# Patient Record
Sex: Female | Born: 1935
Health system: Southern US, Community
[De-identification: ages and names within clinical notes are randomized; demographics above are authoritative.]

## PROBLEM LIST (undated history)

## (undated) DIAGNOSIS — R001 Bradycardia, unspecified: Secondary | ICD-10-CM

## (undated) DIAGNOSIS — M109 Gout, unspecified: Secondary | ICD-10-CM

## (undated) DIAGNOSIS — N289 Disorder of kidney and ureter, unspecified: Secondary | ICD-10-CM

## (undated) DIAGNOSIS — I872 Venous insufficiency (chronic) (peripheral): Secondary | ICD-10-CM

## (undated) DIAGNOSIS — I10 Essential (primary) hypertension: Secondary | ICD-10-CM

## (undated) DIAGNOSIS — I071 Rheumatic tricuspid insufficiency: Secondary | ICD-10-CM

## (undated) DIAGNOSIS — N183 Chronic kidney disease, stage 3 unspecified: Secondary | ICD-10-CM

## (undated) DIAGNOSIS — J449 Chronic obstructive pulmonary disease, unspecified: Secondary | ICD-10-CM

## (undated) DIAGNOSIS — I493 Ventricular premature depolarization: Secondary | ICD-10-CM

## (undated) DIAGNOSIS — R079 Chest pain, unspecified: Secondary | ICD-10-CM

## (undated) HISTORY — DX: Chronic obstructive pulmonary disease, unspecified: J44.9

## (undated) HISTORY — DX: Bradycardia, unspecified: R00.1

## (undated) HISTORY — DX: Rheumatic tricuspid insufficiency: I07.1

## (undated) HISTORY — DX: Chronic kidney disease, stage 3 unspecified: N18.30

## (undated) HISTORY — PX: ABDOMINAL HYSTERECTOMY: SHX81

## (undated) HISTORY — PX: OTHER SURGICAL HISTORY: SHX169

## (undated) HISTORY — DX: Chronic kidney disease, stage 3 (moderate): N18.3

## (undated) HISTORY — DX: Venous insufficiency (chronic) (peripheral): I87.2

---

## 2007-06-02 ENCOUNTER — Ambulatory Visit: Payer: Self-pay | Admitting: Cardiology

## 2007-06-03 ENCOUNTER — Ambulatory Visit: Payer: Self-pay | Admitting: Cardiology

## 2007-06-03 ENCOUNTER — Inpatient Hospital Stay (HOSPITAL_COMMUNITY): Admission: AD | Admit: 2007-06-03 | Discharge: 2007-06-04 | Payer: Self-pay | Admitting: Cardiology

## 2011-03-14 NOTE — Cardiovascular Report (Signed)
NAMEMAHSA, HANSER NO.:  [id number]   MEDICAL RECORD NO.:  [id number]          PATIENT TYPE:  INP   LOCATION:  3738                         FACILITY:  [facility]   PHYSICIAN:  Veverly Fells. Excell Seltzer, MD  DATE OF BIRTH:  [birth year]   DATE OF PROCEDURE:  06/04/2007  DATE OF DISCHARGE:                            CARDIAC CATHETERIZATION   PROCEDURE:  Left heart catheterization, selective coronary angiography,  left ventricular angiography and Star Close of the right femoral artery.   INDICATIONS:  Ms. Fetsch is a 77 year old woman who underwent  arthroscopic knee surgery earlier this week.  Following surgery, she had  two episodes of severe substernal chest pain.  Her pain was typical for  cardiac etiology, and considering her age and the presence of cardiac  risk factors, she was referred for cardiac catheterization.  Her  objective findings have been negative.   Risks and indications of the procedure were explained to the patient.  Informed consent was obtained.  The right groin was prepped, draped and  anesthetized with 1% lidocaine.  Using the modified Seldinger technique,  a 6-French sheath was placed in the right femoral artery, and multiple  views of the left and right coronary arteries were taken using standard  6-French Judkins' catheters.  Following selective coronary angiography,  an angled pigtail catheter was inserted into the left ventricle, and  pressures were recorded.  A left ventriculogram was performed.  A  pullback across the aortic valve was done.   At the conclusion of the procedure, a Star Close device was deployed to  seal the femoral arteriotomy.  The patient tolerated the procedure well  and had no immediate complications.   FINDINGS:  Aortic pressure 109/55 with a mean of 75, left ventricular  pressure 113/18.   CORONARY ANGIOGRAPHY:  The left mainstem is angiographically normal.  It  bifurcates into the LAD and left circumflex.   The LAD  is a large-caliber vessel that courses down to the LV apex.  The  LAD has a small first diagonal branch and a large second diagonal  branch.  In the intervening segment between the two diagonals, there is  an area of focal non-obstructive plaque that creates no significant  stenosis.  The remaining portions of the mid and distal LAD have no  significant angiographic disease.  There is no angiographic disease in  the diagonal branches.   The left circumflex is a large-caliber vessel that courses down and  provides a large first OM branch.  The AV groove circumflex beyond the  OM branch is very small.  There is no significant angiographic disease  in the left circumflex.   The right coronary artery is a large vessel.  It is dominant.  There is  no significant angiographic disease throughout.  Distally, it terminates  in a PDA and posterior AV segment.  The posterior AV segment supplies  two posterolateral branches.  There is no significant angiographic  disease in the branch vessels.   Left ventricular function assessed with 30-degree RAO left  ventriculography demonstrates normal LV systolic function with an LVEF  of 65%.  There are no regional wall motion abnormalities.  There is  catheter-induced mitral regurgitation, but there does not appear to be  any physiologic mitral regurgitation.   ASSESSMENT:  1. Minor nonobstructive plaque disease in the LAD.  2. Normal left circumflex.  3. Normal right coronary artery.  4. Normal left ventricular systolic function.   PLAN:  Ms. Scobee likely has non-cardiac chest pain.  We will plan on  discharge home later today, as she is stable and pain free.      Veverly Fells. Excell Seltzer, MD  Electronically Signed     MDC/MEDQ  D:  06/04/2007  T:  06/04/2007  Job:  [id number]   cc:   Learta Codding, MD,FACC  Wyvonnia Lora

## 2011-03-14 NOTE — Discharge Summary (Signed)
Rhonda Spears, Rhonda Spears NO.:  [id number]   MEDICAL RECORD NO.:  [id number]          PATIENT TYPE:  INP   LOCATION:  3738                         FACILITY:  [facility]   PHYSICIAN:  Veverly Fells. Excell Seltzer, MD  DATE OF BIRTH:  [birth year]   DATE OF ADMISSION:  06/03/2007  DATE OF DISCHARGE:  06/04/2007                               DISCHARGE SUMMARY   PRIMARY CARDIOLOGIST:  Learta Codding, MD,FACC   PRIMARY CARE Teriana Danker:  Wyvonnia Lora, M.D.   DISCHARGE DIAGNOSIS:  Chest pain.   SECONDARY DIAGNOSES:  1. Hypertension.  2. History of palpitations.  3. Osteoarthritis of the knees, status post right knee arthroscopic      surgery.  4. Status post hysterectomy.   ALLERGIES:  SULFA.   PROCEDURE:  Left heart cardiac catheterization.   HISTORY OF PRESENT ILLNESS:  A 77 year old female without prior history  of CAD.  Risk factors notable for hypertension, family history and age.  She recently underwent left knee arthroscopic surgery by Dr. Helayne Seminole on May 31, 2007 under spinal anesthesia and did well without  complications.  On June 01, 2007, however, she had an episode of brief  chest discomfort which resolved spontaneously, and then on June 02, 2007, she had a severe episode of chest discomfort and pressure  associated with dyspnea and diaphoresis.  She presented to the [facility]  ED and was admitted for further evaluation.  She ruled out for MI at  [facility] and also underwent a CT of the chest which was  negative for pulmonary embolism and aortic dissection.  She was  transferred to [facility] after being evaluated by Dr. Andee Lineman for  further evaluation and cardiac catheterization.   HOSPITAL COURSE:  [name] underwent left heart cardiac  catheterization this morning, June 04, 2007, revealing normal coronary  arteries with an EF of 65%.  She tolerated this procedure well and  postprocedure has been ambulating without recurrent discomfort or  limitations.  She will be discharged home today in satisfactory  condition.   DISCHARGE LABORATORIES:  Hemoglobin 11.5, hematocrit 33.4, WBC 12.9,  platelets 262, MCV 93.5.  Sodium 134, potassium 3.7, chloride 103, CO2  of 24, BUN 12, creatinine 1.02, glucose 116, CK 58, MB 0.7, calcium 8.1.   DISPOSITION:  The patient is being discharged home today in good  condition.   FOLLOW-UP PLANS AND APPOINTMENTS:  She is asked to follow up with Dr.  Margo Common in 1-2 weeks.   DISCHARGE MEDICATIONS:  1. Aspirin 81 mg daily.  2. Atenolol 50 mg daily.  3. Triamterene HCTZ 37.5/25 mg daily.  4. Metanx on daily.  5. Multivitamin one daily.  6. Calcium plus D 1200 mg daily.  7. Folic acid 400 mg daily.  8. Celebrex as previously prescribed.  9. Prilosec OTC 20 mg daily.   OUTSTANDING LABORATORY STUDIES:  None.   DURATION OF DISCHARGE ENCOUNTER:  Thirty-three minutes including  physician time.      Nicolasa Ducking, ANP      Veverly Fells. Excell Seltzer, MD  Electronically Signed    CB/MEDQ  D:  06/04/2007  T:  06/04/2007  Job:  [id number]   cc:   Wyvonnia Lora

## 2011-03-14 NOTE — Discharge Summary (Signed)
NAMESHARINE, CADLE NO.:  [id number]   MEDICAL RECORD NO.:  [id number]          PATIENT TYPE:  INP   LOCATION:  3738                         FACILITY:  [facility]   PHYSICIAN:  Veverly Fells. Excell Seltzer, MD  DATE OF BIRTH:  Mar 19, 1934   DATE OF ADMISSION:  06/03/2007  DATE OF DISCHARGE:  06/04/2007                               DISCHARGE SUMMARY   ADDENDUM   SECONDARY DIAGNOSES:  Gout.  Secondary to acute gout flares, she has  been treated with colchicine as an inpatient and we will give her a  short course of Indocin therapy.  She was advised not to take Celebrex  and Indocin together.   DISCHARGE MEDICATIONS:  1. Colchicine 0.6 mg b.i.d.  2. Indocin 50 mg t.i.d. with food x7 days.      Nicolasa Ducking, ANP      Veverly Fells. Excell Seltzer, MD  Electronically Signed    CB/MEDQ  D:  06/04/2007  T:  06/04/2007  Job:  [id number]

## 2011-06-14 DIAGNOSIS — I059 Rheumatic mitral valve disease, unspecified: Secondary | ICD-10-CM

## 2011-08-14 LAB — CBC
HCT: 33.4 — ABNORMAL LOW
MCHC: 34.4
MCV: 93.5
Platelets: 262
RDW: 13.5

## 2011-08-14 LAB — BASIC METABOLIC PANEL
BUN: 12
Chloride: 103
Creatinine, Ser: 1.02
GFR calc Af Amer: >60
Glucose, Bld: 116 — ABNORMAL HIGH
Potassium: 3.7
Sodium: 134 — ABNORMAL LOW

## 2015-07-08 ENCOUNTER — Other Ambulatory Visit (HOSPITAL_COMMUNITY): Payer: Self-pay | Admitting: Pulmonary Disease

## 2015-07-08 ENCOUNTER — Ambulatory Visit (HOSPITAL_COMMUNITY)
Admission: RE | Admit: 2015-07-08 | Discharge: 2015-07-08 | Disposition: A | Discharge status: Home / Self Care | Payer: Medicare Other | Source: Ambulatory Visit | Attending: Pulmonary Disease | Admitting: Pulmonary Disease

## 2015-07-08 DIAGNOSIS — R0602 Shortness of breath: Secondary | ICD-10-CM | POA: Diagnosis not present

## 2015-07-08 DIAGNOSIS — R059 Cough, unspecified: Secondary | ICD-10-CM

## 2015-07-08 DIAGNOSIS — R05 Cough: Secondary | ICD-10-CM | POA: Diagnosis present

## 2017-03-30 ENCOUNTER — Encounter (HOSPITAL_COMMUNITY): Payer: Self-pay

## 2017-03-30 ENCOUNTER — Emergency Department (HOSPITAL_COMMUNITY): Payer: Medicare Other

## 2017-03-30 ENCOUNTER — Other Ambulatory Visit: Payer: Self-pay

## 2017-03-30 ENCOUNTER — Observation Stay (HOSPITAL_COMMUNITY)
Admission: EM | Admit: 2017-03-30 | Discharge: 2017-04-01 | Disposition: A | Discharge status: Home / Self Care | Payer: Medicare Other | Attending: Internal Medicine | Admitting: Internal Medicine

## 2017-03-30 DIAGNOSIS — N183 Chronic kidney disease, stage 3 (moderate): Secondary | ICD-10-CM | POA: Insufficient documentation

## 2017-03-30 DIAGNOSIS — K5790 Diverticulosis of intestine, part unspecified, without perforation or abscess without bleeding: Secondary | ICD-10-CM | POA: Insufficient documentation

## 2017-03-30 DIAGNOSIS — I499 Cardiac arrhythmia, unspecified: Secondary | ICD-10-CM

## 2017-03-30 DIAGNOSIS — Z882 Allergy status to sulfonamides status: Secondary | ICD-10-CM | POA: Insufficient documentation

## 2017-03-30 DIAGNOSIS — K219 Gastro-esophageal reflux disease without esophagitis: Secondary | ICD-10-CM | POA: Insufficient documentation

## 2017-03-30 DIAGNOSIS — Z91040 Latex allergy status: Secondary | ICD-10-CM | POA: Insufficient documentation

## 2017-03-30 DIAGNOSIS — Z8249 Family history of ischemic heart disease and other diseases of the circulatory system: Secondary | ICD-10-CM | POA: Insufficient documentation

## 2017-03-30 DIAGNOSIS — I10 Essential (primary) hypertension: Secondary | ICD-10-CM | POA: Insufficient documentation

## 2017-03-30 DIAGNOSIS — R0789 Other chest pain: Secondary | ICD-10-CM | POA: Diagnosis not present

## 2017-03-30 DIAGNOSIS — M109 Gout, unspecified: Secondary | ICD-10-CM | POA: Diagnosis present

## 2017-03-30 DIAGNOSIS — R51 Headache: Secondary | ICD-10-CM

## 2017-03-30 DIAGNOSIS — I498 Other specified cardiac arrhythmias: Secondary | ICD-10-CM | POA: Diagnosis present

## 2017-03-30 DIAGNOSIS — M1990 Unspecified osteoarthritis, unspecified site: Secondary | ICD-10-CM | POA: Insufficient documentation

## 2017-03-30 DIAGNOSIS — Z881 Allergy status to other antibiotic agents status: Secondary | ICD-10-CM | POA: Diagnosis not present

## 2017-03-30 DIAGNOSIS — I493 Ventricular premature depolarization: Secondary | ICD-10-CM

## 2017-03-30 DIAGNOSIS — I129 Hypertensive chronic kidney disease with stage 1 through stage 4 chronic kidney disease, or unspecified chronic kidney disease: Secondary | ICD-10-CM | POA: Diagnosis not present

## 2017-03-30 DIAGNOSIS — J449 Chronic obstructive pulmonary disease, unspecified: Secondary | ICD-10-CM | POA: Diagnosis not present

## 2017-03-30 DIAGNOSIS — Z7902 Long term (current) use of antithrombotics/antiplatelets: Secondary | ICD-10-CM | POA: Diagnosis not present

## 2017-03-30 DIAGNOSIS — R519 Headache, unspecified: Secondary | ICD-10-CM | POA: Insufficient documentation

## 2017-03-30 DIAGNOSIS — R079 Chest pain, unspecified: Secondary | ICD-10-CM

## 2017-03-30 DIAGNOSIS — N289 Disorder of kidney and ureter, unspecified: Secondary | ICD-10-CM | POA: Diagnosis not present

## 2017-03-30 DIAGNOSIS — I872 Venous insufficiency (chronic) (peripheral): Secondary | ICD-10-CM | POA: Insufficient documentation

## 2017-03-30 DIAGNOSIS — Z79899 Other long term (current) drug therapy: Secondary | ICD-10-CM | POA: Insufficient documentation

## 2017-03-30 DIAGNOSIS — I950 Idiopathic hypotension: Secondary | ICD-10-CM | POA: Insufficient documentation

## 2017-03-30 DIAGNOSIS — R0609 Other forms of dyspnea: Secondary | ICD-10-CM

## 2017-03-30 HISTORY — DX: Essential (primary) hypertension: I10

## 2017-03-30 HISTORY — DX: Gout, unspecified: M10.9

## 2017-03-30 HISTORY — DX: Ventricular premature depolarization: I49.3

## 2017-03-30 HISTORY — DX: Chest pain, unspecified: R07.9

## 2017-03-30 HISTORY — DX: Disorder of kidney and ureter, unspecified: N28.9

## 2017-03-30 LAB — BASIC METABOLIC PANEL
Anion gap: 10 (ref 5–15)
BUN: 20 mg/dL (ref 6–20)
CALCIUM: 9.4 mg/dL (ref 8.9–10.3)
CO2: 24 mmol/L (ref 22–32)
CREATININE: 1.26 mg/dL — AB (ref 0.44–1.00)
Chloride: 103 mmol/L (ref 101–111)
GFR calc Af Amer: 45 mL/min — ABNORMAL LOW (ref >60)
GFR, EST NON AFRICAN AMERICAN: 39 mL/min — AB (ref >60)
GLUCOSE: 121 mg/dL — AB (ref 65–99)
Potassium: 3.7 mmol/L (ref 3.5–5.1)
Sodium: 137 mmol/L (ref 135–145)

## 2017-03-30 LAB — TROPONIN I: Troponin I: <0.03 ng/mL (ref <0.03)

## 2017-03-30 LAB — CBC
HCT: 43.6 % (ref 36.0–46.0)
Hemoglobin: 14.9 g/dL (ref 12.0–15.0)
MCH: 33.4 pg (ref 26.0–34.0)
MCHC: 34.2 g/dL (ref 30.0–36.0)
MCV: 97.8 fL (ref 78.0–100.0)
Platelets: 208 10*3/uL (ref 150–400)
RBC: 4.46 MIL/uL (ref 3.87–5.11)
RDW: 14.2 % (ref 11.5–15.5)
WBC: 7.3 10*3/uL (ref 4.0–10.5)

## 2017-03-30 LAB — MAGNESIUM: MAGNESIUM: 2 mg/dL (ref 1.7–2.4)

## 2017-03-30 MED ORDER — NITROGLYCERIN 2 % TD OINT
1.0000 [in_us] | TOPICAL_OINTMENT | Freq: Once | TRANSDERMAL | Status: AC
  Administered 2017-03-30: 1 [in_us] via TOPICAL
  Filled 2017-03-30: qty 1

## 2017-03-30 MED ORDER — ASPIRIN EC 325 MG PO TBEC
325.0000 mg | DELAYED_RELEASE_TABLET | Freq: Once | ORAL | Status: AC
  Administered 2017-03-30: 325 mg via ORAL
  Filled 2017-03-30: qty 1

## 2017-03-30 MED ORDER — ACETAMINOPHEN 325 MG PO TABS
650.0000 mg | ORAL_TABLET | Freq: Four times a day (QID) | ORAL | Status: DC | PRN
  Administered 2017-03-30: 650 mg via ORAL
  Filled 2017-03-30: qty 2

## 2017-03-30 MED ORDER — NITROGLYCERIN 0.4 MG SL SUBL
0.4000 mg | SUBLINGUAL_TABLET | SUBLINGUAL | Status: DC | PRN
  Administered 2017-03-30: 0.4 mg via SUBLINGUAL
  Filled 2017-03-30: qty 1

## 2017-03-30 NOTE — ED Notes (Signed)
Rates chest pain at a 1 now.

## 2017-03-30 NOTE — ED Notes (Signed)
Report given to Jacki ConesLaurie, [facility]

## 2017-03-30 NOTE — ED Provider Notes (Signed)
[facility]-EMERGENCY DEPT Provider Note   CSN: [id number] Arrival date & time: 03/30/17  1436     History   Chief Complaint Chief Complaint  Patient presents with  . Chest Pain    HPI Rhonda Spears is a 83 y.o. female.  Pt presents to the ED today with chest pressure and sob.  The pt has had sob for a while.  She has seen Dr. Juanetta Gosling for this problem, but has had no treatment for it.  She now gets sob to walk across the room.   The pt said the chest pressure started yesterday.  She saw her pcp who made her an appt for the cardiologist on Monday, June 4.  Pt had more cp today, and was concerned.        Past Medical History:  Diagnosis Date  . Gout   . Hypertension   . Renal disorder     Patient Active Problem List   Diagnosis Date Noted  . HTN (hypertension) 03/30/2017  . Atypical chest pain 03/30/2017  . Chronic headache disorder 03/30/2017  . Osteoarthritis 03/30/2017  . Venous insufficiency 03/30/2017  . Diverticulosis 03/30/2017  . Bigeminy 03/30/2017  . DOE (dyspnea on exertion) 03/30/2017  . Idiopathic hypotension 03/30/2017    Past Surgical History:  Procedure Laterality Date  . arthroscopic left knee    . repair left femoral neck    . right medial men      OB History    No data available       Home Medications    Prior to Admission medications   Medication Sig Start Date End Date Taking? Authorizing Provider  allopurinol (ZYLOPRIM) 100 MG tablet Take 100 mg by mouth at bedtime. 2 tabs hs    [provider]  amitriptyline (ELAVIL) 10 MG tablet Take 10 mg by mouth at bedtime. 1/2 tab hs restless leg    [provider]  atenolol (TENORMIN) 25 MG tablet Take 25 mg by mouth 2 (two) times daily.    [provider]  atenolol (TENORMIN) 50 MG tablet  03/23/17   [provider]  benzonatate (TESSALON) 200 MG capsule Take 200 mg by mouth 3 (three) times daily as needed for cough.    [provider]  Calcium  Carb-Cholecalciferol (CALCIUM-VITAMIN D) 500-200 MG-UNIT tablet Take 1 tablet by mouth 2 (two) times daily.    [provider]  clopidogrel (PLAVIX) 75 MG tablet Take 75 mg by mouth daily.    [provider]  docusate sodium (COLACE) 100 MG capsule Take 100 mg by mouth 2 (two) times daily.    [provider]  folic acid (FOLVITE) 1 MG tablet Take 1 mg by mouth daily.    [provider]  HYDROcodone-acetaminophen (NORCO/VICODIN) 5-325 MG tablet Take 1 tablet by mouth every 6 (six) hours as needed for moderate pain.    [provider]  magnesium oxide (MAG-OX) 400 MG tablet Take 400 mg by mouth daily.    [provider]  Multiple Vitamin (MULTIVITAMIN) capsule Take 1 capsule by mouth daily.    [provider]  ranitidine (ZANTAC) 300 MG tablet Take 300 mg by mouth at bedtime.    [provider]  spironolactone (ALDACTONE) 25 MG tablet Take 25 mg by mouth 2 (two) times daily.    [provider]    Family History Family History  Problem Relation Age of Onset  . Heart attack Mother   . Hypertension Mother     Social  History Social History  Substance Use Topics  . Smoking status: Never Smoker  . Smokeless tobacco: Never Used  . Alcohol use No     Allergies   Tramadol; Sulfa antibiotics; and Zanaflex [tizanidine hcl]   Review of Systems Review of Systems  Respiratory: Positive for shortness of breath.   Cardiovascular: Positive for chest pain.  All other systems reviewed and are negative.    Physical Exam Updated Vital Signs BP 101/62   Pulse (!) 58   Temp 98 F (36.7 C) (Oral)   Resp 16   Ht 5\' 4"  (1.626 m)   Wt 62.6 kg (138 lb)   SpO2 94%   BMI 23.69 kg/m   Physical Exam  Constitutional: She is oriented to person, place, and time. She appears well-developed and well-nourished.  HENT:  Head: Normocephalic and atraumatic.  Right Ear: External ear normal.  Left Ear: External ear normal.    Nose: Nose normal.  Mouth/Throat: Oropharynx is clear and moist.  Eyes: Conjunctivae and EOM are normal. Pupils are equal, round, and reactive to light.  Neck: Normal range of motion. Neck supple.  Cardiovascular: Normal rate, regular rhythm, normal heart sounds and intact distal pulses.   Pulmonary/Chest: Effort normal and breath sounds normal.  Abdominal: Soft. Bowel sounds are normal.  Musculoskeletal: Normal range of motion.  Neurological: She is alert and oriented to person, place, and time.  Skin: Skin is warm.  Psychiatric: She has a normal mood and affect. Her behavior is normal. Judgment and thought content normal.  Nursing note and vitals reviewed.    ED Treatments / Results  Labs (all labs ordered are listed, but only abnormal results are displayed) Labs Reviewed  BASIC METABOLIC PANEL - Abnormal; Notable for the following:       Result Value   Glucose, Bld 121 (*)    Creatinine, Ser 1.26 (*)    GFR calc non Af Amer 39 (*)    GFR calc Af Amer 45 (*)    All other components within normal limits  CBC  TROPONIN I    EKG  EKG Interpretation  Date/Time:  Friday March 30 2017 14:43:49 EDT Ventricular Rate:  59 PR Interval:  202 QRS Duration: 84 QT Interval:  406 QTC Calculation: 401 R Axis:   -43 Text Interpretation:  Sinus bradycardia with frequent Premature ventricular complexes in a pattern of bigeminy Left axis deviation Low voltage QRS Septal infarct , age undetermined Abnormal ECG bigeminy is new Confirmed by Jacalyn LefevreHaviland, Jashae Wiggs [phone]([id number]) on 03/30/2017 3:26:07 PM       Radiology Dg Chest 2 View  Result Date: 03/30/2017 CLINICAL DATA:  Chest pain EXAM: CHEST  2 VIEW COMPARISON:  04/13/2016 FINDINGS: Chronic hyperinflation and interstitial coarsening. Chronic biapical and substernal pleuroparenchymal scarring. 11 mm nodular density over the left base likely nipple shadow, also suggested on the right. Nipple position cannot be confirmed due to coverage in the lateral  projection. Normal heart size. Aortic tortuosity. IMPRESSION: 1. COPD and scarring without acute superimposed finding. 2. 11 mm nodular density at the left base favors nipple shadow. Recommend nonemergent follow-up chest x-ray which could use nipple markers. Electronically Signed   By: Marnee SpringJonathon  Watts M.D.   On: 03/30/2017 15:07    Procedures Procedures (including critical care time)  Medications Ordered in ED Medications  nitroGLYCERIN (NITROSTAT) SL tablet 0.4 mg (0.4 mg Sublingual Given 03/30/17 1711)  nitroGLYCERIN (NITROGLYN) 2 % ointment 1 inch (not administered)  aspirin EC tablet 325 mg (325 mg Oral Given  03/30/17 1711)     Initial Impression / Assessment and Plan / ED Course  I have reviewed the triage vital signs and the nursing notes.  Pertinent labs & imaging results that were available during my care of the patient were reviewed by me and considered in my medical decision making (see chart for details).    Pt's CP improved with nitro.  She was given nitro paste also.  The pt was d/w Dr. Adrian Blackwater (triad) who requested that I speak with cardiology at [facility] as he felt pt would be better there in case she needed an intervention.  The pt was d/w Dr. Delton See (cardiology) who consult herself upon pt's arrival or have someone from the group see her.   Final Clinical Impressions(s) / ED Diagnoses   Final diagnoses:  Chest pain, unspecified type  Bigeminy    New Prescriptions New Prescriptions   No medications on file     Jacalyn Lefevre, MD 03/30/17 1757

## 2017-03-30 NOTE — H&P (Signed)
History and Physical  NOVALI VOLLMAN ZOX:[id number] DOB: 1934-08-19 DOA: 03/30/2017  Referring physician: Dr Particia Nearing, ED physician PCP: Louie Boston., MD  Outpatient Specialists:   Patient Coming From: home  Chief Complaint: chest pain  HPI: Rhonda Spears is a 83 y.o. female with a history of Gout, HTN, GERD. Patient presents for evaluation of chest pain, which started a little more than a week ago. Patient saw her primary care physician who got her appointment with cardiology on Monday 6/4. She was also prescribed Plavix for prophylaxis, although she has not started yet. The patient had worsening chest pain over the past 2-3 days, although worse today. It was accompanied by shortness of breath, worse with exertion to about 15-20 feet. Chest pain is nonradiating.  Emergency Department Course: Patient seen nitroglycerin and aspirin, with moderate improvement of her pain. EKG and plantar monitoring showed bigeminy with a rate of 58-60. EDP consulted cardiology, who will see the patient at most, hospital.  Review of Systems:   Pt denies any fevers, chills, nausea, vomiting, diarrhea, constipation, abdominal pain, shortness of breath, dyspnea on exertion, orthopnea, cough, wheezing, palpitations, headache, vision changes, lightheadedness, dizziness, melena, rectal bleeding.  Review of systems are otherwise negative  Past Medical History:  Diagnosis Date  . Gout   . Hypertension   . Renal disorder    Past Surgical History:  Procedure Laterality Date  . arthroscopic left knee    . repair left femoral neck    . right medial men     Social History:  reports that she has never smoked. She has never used smokeless tobacco. She reports that she does not drink alcohol or use drugs. Patient lives atHome  Allergies  Allergen Reactions  . Latex Itching  . Omeprazole Palpitations  . Tramadol Itching and Nausea Only    No hives  . Sulfa Antibiotics Nausea Only and Other (See Comments)      Severe headaches  . Zanaflex [Tizanidine Hcl] Itching and Other (See Comments)    oversedation    Family History  Problem Relation Age of Onset  . Heart attack Mother   . Hypertension Mother       Prior to Admission medications   Medication Sig Start Date End Date Taking? Authorizing Provider  allopurinol (ZYLOPRIM) 100 MG tablet Take 100 mg by mouth at bedtime. 2 tabs hs    [provider]  amitriptyline (ELAVIL) 10 MG tablet Take 10 mg by mouth at bedtime. 1/2 tab hs restless leg    [provider]  atenolol (TENORMIN) 25 MG tablet Take 25 mg by mouth 2 (two) times daily.    [provider]  clopidogrel (PLAVIX) 75 MG tablet Take 75 mg by mouth daily.    [provider]  folic acid (FOLVITE) 1 MG tablet Take 1 mg by mouth daily.    [provider]  HYDROcodone-acetaminophen (NORCO/VICODIN) 5-325 MG tablet Take 1 tablet by mouth every 6 (six) hours as needed for moderate pain.    [provider]  magnesium oxide (MAG-OX) 400 MG tablet Take 400 mg by mouth daily.    [provider]  Multiple Vitamin (MULTIVITAMIN) capsule Take 1 capsule by mouth daily.    [provider]  ranitidine (ZANTAC) 300 MG tablet Take 300 mg by mouth at bedtime.    [provider]  spironolactone (ALDACTONE) 25 MG tablet Take 25 mg by mouth 2 (two) times daily.    [provider]    Physical  Exam: BP 101/62   Pulse (!) 58   Temp 98 F (36.7 C) (Oral)   Resp 16   Ht 5\' 4"  (1.626 m)   Wt 62.6 kg (138 lb)   SpO2 94%   BMI 23.69 kg/m   General: Elderly Caucasian female. Awake and alert and oriented x3. No acute cardiopulmonary distress.  HEENT: Normocephalic atraumatic.  Right and left ears normal in appearance.  Pupils equal, round, reactive to light. Extraocular muscles are intact. Sclerae anicteric and noninjected.  Moist mucosal membranes. No mucosal lesions.  Neck: Neck supple without lymphadenopathy. No  carotid bruits. No masses palpated.  Cardiovascular: Regular rate with normal S1-S2 sounds. No murmurs, rubs, gallops auscultated. No JVD.  Respiratory: Good respiratory effort with no wheezes, rales, rhonchi. Lungs clear to auscultation bilaterally.  No accessory muscle use. Abdomen: Soft, nontender, nondistended. Active bowel sounds. No masses or hepatosplenomegaly  Skin: No rashes, lesions, or ulcerations.  Dry, warm to touch. 2+ dorsalis pedis and radial pulses. Musculoskeletal: No calf or leg pain. All major joints not erythematous nontender.  No upper or lower joint deformation.  Good ROM.  No contractures  Psychiatric: Intact judgment and insight. Pleasant and cooperative. Neurologic: No focal neurological deficits. Strength is 5/5 and symmetric in upper and lower extremities.  Cranial nerves II through XII are grossly intact.           Labs on Admission: I have personally reviewed following labs and imaging studies  CBC:  Recent Labs Lab 03/30/17 1514  WBC 7.3  HGB 14.9  HCT 43.6  MCV 97.8  PLT 208   Basic Metabolic Panel:  Recent Labs Lab 03/30/17 1514  NA 137  K 3.7  CL 103  CO2 24  GLUCOSE 121*  BUN 20  CREATININE 1.26*  CALCIUM 9.4   GFR: Estimated Creatinine Clearance: 30.8 mL/min (A) (by C-G formula based on SCr of 1.26 mg/dL (H)). Liver Function Tests: No results for input(s): AST, ALT, ALKPHOS, BILITOT, PROT, ALBUMIN in the last 168 hours. No results for input(s): LIPASE, AMYLASE in the last 168 hours. No results for input(s): AMMONIA in the last 168 hours. Coagulation Profile: No results for input(s): INR, PROTIME in the last 168 hours. Cardiac Enzymes:  Recent Labs Lab 03/30/17 1514  TROPONINI <0.03   BNP (last 3 results) No results for input(s): PROBNP in the last 8760 hours. HbA1C: No results for input(s): HGBA1C in the last 72 hours. CBG: No results for input(s): GLUCAP in the last 168 hours. Lipid Profile: No results for input(s):  CHOL, HDL, LDLCALC, TRIG, CHOLHDL, LDLDIRECT in the last 72 hours. Thyroid Function Tests: No results for input(s): TSH, T4TOTAL, FREET4, T3FREE, THYROIDAB in the last 72 hours. Anemia Panel: No results for input(s): VITAMINB12, FOLATE, FERRITIN, TIBC, IRON, RETICCTPCT in the last 72 hours. Urine analysis: No results found for: COLORURINE, APPEARANCEUR, LABSPEC, PHURINE, GLUCOSEU, HGBUR, BILIRUBINUR, KETONESUR, PROTEINUR, UROBILINOGEN, NITRITE, LEUKOCYTESUR Sepsis Labs: @LABRCNTIP (procalcitonin:4,lacticidven:4) )No results found for this or any previous visit (from the past 240 hour(s)).   Radiological Exams on Admission: Dg Chest 2 View  Result Date: 03/30/2017 CLINICAL DATA:  Chest pain EXAM: CHEST  2 VIEW COMPARISON:  04/13/2016 FINDINGS: Chronic hyperinflation and interstitial coarsening. Chronic biapical and substernal pleuroparenchymal scarring. 11 mm nodular density over the left base likely nipple shadow, also suggested on the right. Nipple position cannot be confirmed due to coverage in the lateral projection. Normal heart size. Aortic tortuosity. IMPRESSION: 1. COPD and scarring without acute superimposed finding. 2. 11 mm nodular  density at the left base favors nipple shadow. Recommend nonemergent follow-up chest x-ray which could use nipple markers. Electronically Signed   By: Marnee SpringJonathon  Watts M.D.   On: 03/30/2017 15:07    EKG: Independently reviewed. New bigeminy  Assessment/Plan: Active Problems:   HTN (hypertension)   Bigeminy   Chest pain    This patient was discussed with the ED physician, including pertinent vitals, physical exam findings, labs, and imaging.  We also discussed care given by the ED provider.  #1 chest pain  [facility]  Cardiology consulted  Chest pain rule out with serial troponins  Echocardiogram tomorrow  Morphine for pain  Nitro paste placed  Tylenol for symptomatic headache #2 bigeminy  Cardiology  consulted  Check magnesium and TSH #3 hypertension  Controlled  DVT prophylaxis: Lovenox Consultants: Cardiology Code Status: Full code Family Communication: Multiple family members in the room  Disposition Plan: Observation with probable discharge tomorrow pending cardiology input   Levie HeritageStinson, Zyen Triggs J, DO [facility] Pager [phone]  If 7PM-7AM, please contact night-coverage [email or web address] Password [email or web address]

## 2017-03-30 NOTE — ED Notes (Signed)
Verbalized some relief of chest pain .  Rates 3/10

## 2017-03-30 NOTE — ED Triage Notes (Signed)
Reports of chest pain started yesterday with shortness of breath. Has apt with cardiology Monday but pain was worsening.

## 2017-03-30 NOTE — ED Notes (Signed)
Pt used bedpan, cleaned up at this time

## 2017-03-30 NOTE — ED Notes (Signed)
EKG given to Dr. Yelverton 

## 2017-03-31 DIAGNOSIS — I499 Cardiac arrhythmia, unspecified: Secondary | ICD-10-CM | POA: Diagnosis not present

## 2017-03-31 DIAGNOSIS — R072 Precordial pain: Secondary | ICD-10-CM

## 2017-03-31 DIAGNOSIS — M109 Gout, unspecified: Secondary | ICD-10-CM

## 2017-03-31 DIAGNOSIS — I10 Essential (primary) hypertension: Secondary | ICD-10-CM | POA: Diagnosis not present

## 2017-03-31 DIAGNOSIS — N183 Chronic kidney disease, stage 3 (moderate): Secondary | ICD-10-CM

## 2017-03-31 DIAGNOSIS — R079 Chest pain, unspecified: Secondary | ICD-10-CM

## 2017-03-31 DIAGNOSIS — R0789 Other chest pain: Secondary | ICD-10-CM | POA: Diagnosis not present

## 2017-03-31 LAB — BASIC METABOLIC PANEL
ANION GAP: 7 (ref 5–15)
BUN: 19 mg/dL (ref 6–20)
CHLORIDE: 104 mmol/L (ref 101–111)
CO2: 24 mmol/L (ref 22–32)
Calcium: 8.9 mg/dL (ref 8.9–10.3)
Creatinine, Ser: 1.14 mg/dL — ABNORMAL HIGH (ref 0.44–1.00)
GFR calc Af Amer: 51 mL/min — ABNORMAL LOW (ref >60)
GFR, EST NON AFRICAN AMERICAN: 44 mL/min — AB (ref >60)
GLUCOSE: 95 mg/dL (ref 65–99)
POTASSIUM: 4.4 mmol/L (ref 3.5–5.1)
Sodium: 135 mmol/L (ref 135–145)

## 2017-03-31 LAB — TSH: TSH: 2.418 u[IU]/mL (ref 0.350–4.500)

## 2017-03-31 MED ORDER — ACETAMINOPHEN 325 MG PO TABS
650.0000 mg | ORAL_TABLET | ORAL | Status: DC | PRN
  Administered 2017-03-31: 650 mg via ORAL
  Filled 2017-03-31: qty 2

## 2017-03-31 MED ORDER — POTASSIUM CHLORIDE CRYS ER 20 MEQ PO TBCR
40.0000 meq | EXTENDED_RELEASE_TABLET | Freq: Once | ORAL | Status: DC
  Filled 2017-03-31: qty 2

## 2017-03-31 MED ORDER — ENOXAPARIN SODIUM 40 MG/0.4ML ~~LOC~~ SOLN
40.0000 mg | Freq: Every day | SUBCUTANEOUS | Status: DC
  Administered 2017-03-31 (×2): 40 mg via SUBCUTANEOUS
  Filled 2017-03-31 (×2): qty 0.4

## 2017-03-31 MED ORDER — MORPHINE SULFATE (PF) 2 MG/ML IV SOLN: 2.0000 mg | INTRAVENOUS | Status: DC | PRN

## 2017-03-31 MED ORDER — ONDANSETRON HCL 4 MG/2ML IJ SOLN: 4.0000 mg | Freq: Four times a day (QID) | INTRAMUSCULAR | Status: DC | PRN

## 2017-03-31 MED ORDER — GI COCKTAIL ~~LOC~~: 30.0000 mL | Freq: Four times a day (QID) | ORAL | Status: DC | PRN

## 2017-03-31 MED ORDER — ASPIRIN EC 325 MG PO TBEC
325.0000 mg | DELAYED_RELEASE_TABLET | Freq: Every day | ORAL | Status: DC
  Administered 2017-03-31 – 2017-04-01 (×2): 325 mg via ORAL
  Filled 2017-03-31 (×2): qty 1

## 2017-03-31 NOTE — Consult Note (Addendum)
Cardiology Consultation:   Patient ID: Rhonda Spears; [id number]; [birth year]   Admit date: 03/30/2017 Date of Consult: 03/31/2017  Primary Care Provider: Louie Boston., MD Primary Cardiologist: new Primary Electrophysiologist:  new   Patient Profile:   Rhonda Spears is a 83 y.o. female with a hx of HTN who is being seen today for the evaluation of chest pressure and sob at the request of Dr. Adrian Blackwater.  History of Present Illness:   Rhonda Spears is a 83 yo woman with HTN who presented with chest pain about 15 years ago. She had a left heart cath and was told it was all ok. She has c/o worsening sob and chest pressure with exertion over the past few months. The patient has not had syncope. She does not c/o peripheral edema. The patient denies arm pain. Associated with her symptoms are palpitations. She has had some PVC's.   Past Medical History:  Diagnosis Date  . Gout   . Hypertension   . Renal disorder     Past Surgical History:  Procedure Laterality Date  . arthroscopic left knee    . repair left femoral neck    . right medial men       Inpatient Medications: Scheduled Meds: . aspirin EC  325 mg Oral Daily  . enoxaparin (LOVENOX) injection  40 mg Subcutaneous QHS   Continuous Infusions:  PRN Meds: acetaminophen, gi cocktail, morphine injection, nitroGLYCERIN, ondansetron (ZOFRAN) IV  Allergies:    Allergies  Allergen Reactions  . Latex Itching  . Omeprazole Palpitations  . Tramadol Itching and Nausea Only    No hives  . Sulfa Antibiotics Nausea Only and Other (See Comments)    Severe headaches  . Zanaflex [Tizanidine Hcl] Itching and Other (See Comments)    oversedation    Social History:   Social History   Social History  . Marital status: Divorced    Spouse name: N/A  . Number of children: N/A  . Years of education: N/A   Occupational History  . Not on file.   Social History Main Topics  . Smoking status: Never Smoker  . Smokeless  tobacco: Never Used  . Alcohol use No  . Drug use: No  . Sexual activity: Not on file   Other Topics Concern  . Not on file   Social History Narrative  . No narrative on file    Family History:   The patient's family history includes Heart attack in her mother; Hypertension in her mother.  ROS:  Please see the history of present illness.  ROS  All other ROS reviewed and negative.     Physical Exam/Data:   Vitals:   03/30/17 2342 03/31/17 0509 03/31/17 0803 03/31/17 1305  BP: (!) 104/56 (!) 92/56 97/69 (!) 102/54  Pulse: (!) 55 (!) 54 (!) 51 60  Resp: (!) 22 20 18 18   Temp: 97.8 F (36.6 C) 98.4 F (36.9 C) 97.5 F (36.4 C) 98.2 F (36.8 C)  TempSrc: Oral Oral Oral Oral  SpO2: 97% 94% 95% 95%  Weight: 134 lb (60.8 kg)     Height: 5\' 4"  (1.626 m)       Intake/Output Summary (Last 24 hours) at 03/31/17 1340 Last data filed at 03/31/17 1304  Gross per 24 hour  Intake              480 ml  Output                5  ml  Net              475 ml   Filed Weights   03/30/17 1440 03/30/17 2342  Weight: 138 lb (62.6 kg) 134 lb (60.8 kg)   Body mass index is 23 kg/m.  General:  Well nourished, well developed elderly woman, in no acute distress HEENT: normal with moist oralpharynx Lymph: no adenopathy Neck: 6-7 cm JVD Endocrine:  No thryomegaly Vascular: No carotid bruits; FA pulses 2+ bilaterally without bruits  Cardiac:  normal S1, S2; RRR; no murmur  Lungs:  clear to auscultation bilaterally, no wheezing, rhonchi or rales  Abd: soft, nontender, no hepatomegaly  Ext: no edema Musculoskeletal:  No deformities, BUE and BLE strength normal and equal Skin: warm and dry  Neuro:  CNs 2-12 intact, no focal abnormalities noted Psych:  Normal affect    EKG:  The EKG was personally reviewed and demonstrates nsr  Relevant CV Studies: Echo pending  Laboratory Data:  Chemistry Recent Labs Lab 03/30/17 1514  NA 137  K 3.7  CL 103  CO2 24  GLUCOSE 121*  BUN 20    CREATININE 1.26*  CALCIUM 9.4  GFRNONAA 39*  GFRAA 45*  ANIONGAP 10    No results for input(s): PROT, ALBUMIN, AST, ALT, ALKPHOS, BILITOT in the last 168 hours. Hematology Recent Labs Lab 03/30/17 1514  WBC 7.3  RBC 4.46  HGB 14.9  HCT 43.6  MCV 97.8  MCH 33.4  MCHC 34.2  RDW 14.2  PLT 208   Cardiac Enzymes Recent Labs Lab 03/30/17 1514 03/30/17 1830  TROPONINI <0.03 <0.03   No results for input(s): TROPIPOC in the last 168 hours.  BNPNo results for input(s): BNP, PROBNP in the last 168 hours.  DDimer No results for input(s): DDIMER in the last 168 hours.  Radiology/Studies:  Dg Chest 2 View  Result Date: 03/30/2017 CLINICAL DATA:  Chest pain EXAM: CHEST  2 VIEW COMPARISON:  04/13/2016 FINDINGS: Chronic hyperinflation and interstitial coarsening. Chronic biapical and substernal pleuroparenchymal scarring. 11 mm nodular density over the left base likely nipple shadow, also suggested on the right. Nipple position cannot be confirmed due to coverage in the lateral projection. Normal heart size. Aortic tortuosity. IMPRESSION: 1. COPD and scarring without acute superimposed finding. 2. 11 mm nodular density at the left base favors nipple shadow. Recommend nonemergent follow-up chest x-ray which could use nipple markers. Electronically Signed   By: Marnee SpringJonathon  Watts M.D.   On: 03/30/2017 15:07    Assessment and Plan:   1. Chest pressure and sob with exertion - she has ruled out for MI.  The etiology is unclear. If her EF is normal, then a stress test as an outpatient and some lasix 20-40 mg daily would be appropriate. If her EF is abnormal then a right and left heart cath would be reasonable.  2. HTN - her blood pressure is well controlled. She will continue her current meds. 3. PVC's - this has improved. She will likely need an outpatient cardiac monitor.  Signed, Lewayne BuntingGregg Taylor, MD  03/31/2017 1:40 PM

## 2017-03-31 NOTE — Progress Notes (Signed)
PROGRESS NOTE    Rhonda Spears  WUJ:[id number] DOB: 06/23/1934 DOA: 03/30/2017 PCP: Louie Bostonapper, David B., MD   Chief Complaint  Patient presents with  . Chest Pain    Brief Narrative:  HPI on 03/30/2017 by Dr. Candelaria CelesteJacob Stinson Rhonda DadaYvonne L Spears is a 83 y.o. female with a history of Gout, HTN, GERD. Patient presents for evaluation of chest pain, which started a little more than a week ago. Patient saw her primary care physician who got her appointment with cardiology on Monday 6/4. She was also prescribed Plavix for prophylaxis, although she has not started yet. The patient had worsening chest pain over the past 2-3 days, although worse today. It was accompanied by shortness of breath, worse with exertion to about 15-20 feet. Chest pain is nonradiating.  Assessment & Plan   Chest pain with dyspnea on exertion -unknown etiology -CXR unremarkable for infection, does show COPD -Troponin cycled and currently negative -Cardiology consulted and appreciated -Echocardiogram pending -Continue aspirin   Bigeminy/PVC -Will likely need outpatient cardiac monitor  -TSH 2.418 -Magnesium 2, potassium 3.7 (will give supplemental dose- goal of 4)  Essential hypertension -BP currently soft to normal -continue to monitor  Chronic kidney disease, stage III -Creatinine currently 1.26, GFR in stage III as it was back in 2008  Gout -on allopurinol at home  DVT Prophylaxis  Lovenox  Code Status: Full  Family Communication: Family at bedside  Disposition Plan: Observation. Pending echocardiogram. Discharge to home pending workup.   Consultants Cardiology/EP  Procedures  None  Antibiotics   Anti-infectives    None      Subjective:   Rhonda Spears seen and examined today.  Currently feels a sharp pain in her left side, with shortness of breath. Denies current abdominal pain, nausea or vomiting. Denies dizziness, headache, or loss of consciousness.   Objective:   Vitals:   03/30/17 2342  03/31/17 0509 03/31/17 0803 03/31/17 1305  BP: (!) 104/56 (!) 92/56 97/69 (!) 102/54  Pulse: (!) 55 (!) 54 (!) 51 60  Resp: (!) 22 20 18 18   Temp: 97.8 F (36.6 C) 98.4 F (36.9 C) 97.5 F (36.4 C) 98.2 F (36.8 C)  TempSrc: Oral Oral Oral Oral  SpO2: 97% 94% 95% 95%  Weight: 60.8 kg (134 lb)     Height: 5\' 4"  (1.626 m)       Intake/Output Summary (Last 24 hours) at 03/31/17 1416 Last data filed at 03/31/17 1304  Gross per 24 hour  Intake              480 ml  Output                5 ml  Net              475 ml   Filed Weights   03/30/17 1440 03/30/17 2342  Weight: 62.6 kg (138 lb) 60.8 kg (134 lb)    Exam  General: Well developed, well nourished, NAD, appears stated age  HEENT: NCAT, mucous membranes moist.   Cardiovascular: S1 S2 auscultated, no murmurs, bradycardic  Respiratory: Clear to auscultation bilaterally with equal chest rise  Abdomen: Soft, nontender, nondistended, + bowel sounds  Extremities: warm dry without cyanosis clubbing or edema  Neuro: AAOx3, nonfocal  Psych: Normal affect and demeanor with intact judgement and insight  Data Reviewed: I have personally reviewed following labs and imaging studies  CBC:  Recent Labs Lab 03/30/17 1514  WBC 7.3  HGB 14.9  HCT 43.6  MCV  97.8  PLT 208   Basic Metabolic Panel:  Recent Labs Lab 03/30/17 1514 03/30/17 1830  NA 137  --   K 3.7  --   CL 103  --   CO2 24  --   GLUCOSE 121*  --   BUN 20  --   CREATININE 1.26*  --   CALCIUM 9.4  --   MG  --  2.0   GFR: Estimated Creatinine Clearance: 30.8 mL/min (A) (by C-G formula based on SCr of 1.26 mg/dL (H)). Liver Function Tests: No results for input(s): AST, ALT, ALKPHOS, BILITOT, PROT, ALBUMIN in the last 168 hours. No results for input(s): LIPASE, AMYLASE in the last 168 hours. No results for input(s): AMMONIA in the last 168 hours. Coagulation Profile: No results for input(s): INR, PROTIME in the last 168 hours. Cardiac  Enzymes:  Recent Labs Lab 03/30/17 1514 03/30/17 1830  TROPONINI <0.03 <0.03   BNP (last 3 results) No results for input(s): PROBNP in the last 8760 hours. HbA1C: No results for input(s): HGBA1C in the last 72 hours. CBG: No results for input(s): GLUCAP in the last 168 hours. Lipid Profile: No results for input(s): CHOL, HDL, LDLCALC, TRIG, CHOLHDL, LDLDIRECT in the last 72 hours. Thyroid Function Tests:  Recent Labs  03/31/17 0249  TSH 2.418   Anemia Panel: No results for input(s): VITAMINB12, FOLATE, FERRITIN, TIBC, IRON, RETICCTPCT in the last 72 hours. Urine analysis: No results found for: COLORURINE, APPEARANCEUR, LABSPEC, PHURINE, GLUCOSEU, HGBUR, BILIRUBINUR, KETONESUR, PROTEINUR, UROBILINOGEN, NITRITE, LEUKOCYTESUR Sepsis Labs: @LABRCNTIP (procalcitonin:4,lacticidven:4)  )No results found for this or any previous visit (from the past 240 hour(s)).    Radiology Studies: Dg Chest 2 View  Result Date: 03/30/2017 CLINICAL DATA:  Chest pain EXAM: CHEST  2 VIEW COMPARISON:  04/13/2016 FINDINGS: Chronic hyperinflation and interstitial coarsening. Chronic biapical and substernal pleuroparenchymal scarring. 11 mm nodular density over the left base likely nipple shadow, also suggested on the right. Nipple position cannot be confirmed due to coverage in the lateral projection. Normal heart size. Aortic tortuosity. IMPRESSION: 1. COPD and scarring without acute superimposed finding. 2. 11 mm nodular density at the left base favors nipple shadow. Recommend nonemergent follow-up chest x-ray which could use nipple markers. Electronically Signed   By: Marnee Spring M.D.   On: 03/30/2017 15:07     Scheduled Meds: . aspirin EC  325 mg Oral Daily  . enoxaparin (LOVENOX) injection  40 mg Subcutaneous QHS   Continuous Infusions:   LOS: 0 days   Time Spent in minutes   30 minutes  Wess Baney D.O. on 03/31/2017 at 2:16 PM  Between 7am to 7pm - Pager - [phone]  After  7pm go to [email or web address] - password [email or web address]  And look for the night coverage person covering for me after hours  [facility] Office  [phone]

## 2017-03-31 NOTE — Discharge Summary (Signed)
Physician Discharge Summary  Rhonda Spears ZOX:[id number] DOB: 12/03/1933 DOA: 03/30/2017  PCP: Louie Boston., MD  Admit date: 03/30/2017 Discharge date: 04/01/2017  Time spent: 45 minutes  Recommendations for Outpatient Follow-up:  Patient will be discharged to home.  Patient will need to follow up with primary care provider within one week of discharge.  Follow up with cardiology for outpatient stress test and cardiac montior. Patient should continue medications as prescribed.  Patient should follow a heart healthy diet.   Discharge Diagnoses:  Chest pain with dyspnea on exertion Bigeminy/PVC Essential hypertension Chronic kidney disease, stage III Gout  Discharge Condition: Stable  Diet recommendation: heart healthy  Filed Weights   03/30/17 1440 03/30/17 2342  Weight: 62.6 kg (138 lb) 60.8 kg (134 lb)    History of present illness:  on 03/30/2017 by Dr. Charlyn Minerva a 83 y.o.femalewith a history of Gout, HTN, GERD. Patient presents for evaluation of chest pain, which started a little more than a week ago. Patient saw her primary care physician who got her appointment with cardiology on Monday 6/4. She was also prescribed Plavix for prophylaxis, although she has not started yet. The patient had worsening chest pain over the past 2-3 days, although worse today. It was accompanied by shortness of breath, worse with exertion to about 15-20 feet. Chest pain is nonradiating.  Hospital Course:  Chest pain with dyspnea on exertion -unknown etiology -CXR unremarkable for infection, does show COPD -Troponin cycled and currently negative -Cardiology consulted and appreciated -Echocardiogram showed EF 55-60%, grade 2 diastolic dysfunction -Continue aspirin (patient prescribed plavix however had not started it yet)  Bigeminy/PVC, bradycardia -Will likely need outpatient cardiac monitor  -TSH 2.418 -Magnesium 2 -Potassium 4.4 -noted to have bradycardia, with  HR as low as 30. Currently in the 50s. Suspect atenolol may not be clearing well given her CKD, stage III. Discussed with Dr. Ladona Ridgel, will reduce dose of atenolol to 25mg  daily.   Essential hypertension -BP currently soft to normal -Continue atenolol, reduced dose -continue to monitor  Chronic kidney disease, stage III -Creatinine currently 1.14 -GFR in stage III as it was back in 2008  Gout -on allopurinol at home  Procedures: Echocardiogram  Consultations: Cardiology/EP  Discharge Exam: Vitals:   04/01/17 0445 04/01/17 1505  BP: 110/61 111/64  Pulse: (!) 59 67  Resp: 18 18  Temp: 98 F (36.7 C) 97.8 F (36.6 C)   Patient no longer feels chest pain or shortness of breath. Denies abdominal pain, nausea, vomiting, diarrhea, constipation, dizziness, or headache.   General: Well developed, well nourished, NAD, appears stated age  HEENT: NCAT,mucous membranes moist.  Cardiovascular: S1 S2 auscultated, RRR, no murmurs appreciated  Respiratory: Clear to auscultation bilaterally with equal chest rise  Abdomen: Soft, nontender, nondistended, + bowel sounds  Extremities: warm dry without cyanosis clubbing or edema  Neuro: AAOx3, nonfocal  Psych: Normal affect and demeanor with intact judgement and insight, pleasant  Discharge Instructions Discharge Instructions    Discharge instructions    Complete by:  As directed    Patient will be discharged to home.  Patient will need to follow up with primary care provider within one week of discharge.  Follow up with cardiology for outpatient stress test and cardiac montior. Patient should continue medications as prescribed.  Patient should follow a heart healthy diet.     Current Discharge Medication List    START taking these medications   Details  aspirin EC 325 MG EC tablet  Take 1 tablet (325 mg total) by mouth daily. Qty: 30 tablet, Refills: 0      CONTINUE these medications which have CHANGED   Details  atenolol  (TENORMIN) 25 MG tablet Take 1 tablet (25 mg total) by mouth daily. Qty: 30 tablet, Refills: 0      CONTINUE these medications which have NOT CHANGED   Details  allopurinol (ZYLOPRIM) 100 MG tablet Take 100 mg by mouth at bedtime. 2 tabs hs    amitriptyline (ELAVIL) 10 MG tablet Take 5 mg by mouth at bedtime. 1/2 tab hs restless leg     Calcium Carb-Cholecalciferol (CALCIUM 600+D) 600-800 MG-UNIT TABS Take 1 tablet by mouth daily.    Carboxymethylcellul-Glycerin (CLEAR EYES FOR DRY EYES OP) Place 1-2 drops into both eyes daily as needed.    cholecalciferol (VITAMIN D) 1000 units tablet Take 1,000 Units by mouth daily.    folic acid (FOLVITE) 400 MCG tablet Take 400 mcg by mouth at bedtime.    HYDROcodone-acetaminophen (NORCO/VICODIN) 5-325 MG tablet Take 1 tablet by mouth every 6 (six) hours as needed for moderate pain.    Magnesium 250 MG TABS Take 1 tablet by mouth daily.    Multiple Vitamin (MULTIVITAMIN) capsule Take 1 capsule by mouth daily.    spironolactone (ALDACTONE) 25 MG tablet Take 25 mg by mouth daily.     clopidogrel (PLAVIX) 75 MG tablet Take 75 mg by mouth daily.       Allergies  Allergen Reactions  . Latex Itching  . Omeprazole Palpitations  . Tramadol Itching and Nausea Only    No hives  . Sulfa Antibiotics Nausea Only and Other (See Comments)    Severe headaches  . Zanaflex [Tizanidine Hcl] Itching and Other (See Comments)    oversedation   Follow-up Information    Louie Boston., MD. Schedule an appointment as soon as possible for a visit in 1 week(s).   Specialty:  Family Medicine Why:  Hospital follow up Contact information: [address] [place] [address] [phone]        Marinus Maw, MD. Schedule an appointment as soon as possible for a visit in 1 week(s).   Specialty:  Cardiology Why:  Hospital follow up. Outpatient stress test and cardiac monitor.  Contact information: [address] [place]  [address] [phone]            The results of significant diagnostics from this hospitalization (including imaging, microbiology, ancillary and laboratory) are listed below for reference.    Significant Diagnostic Studies: Dg Chest 2 View  Result Date: 03/30/2017 CLINICAL DATA:  Chest pain EXAM: CHEST  2 VIEW COMPARISON:  04/13/2016 FINDINGS: Chronic hyperinflation and interstitial coarsening. Chronic biapical and substernal pleuroparenchymal scarring. 11 mm nodular density over the left base likely nipple shadow, also suggested on the right. Nipple position cannot be confirmed due to coverage in the lateral projection. Normal heart size. Aortic tortuosity. IMPRESSION: 1. COPD and scarring without acute superimposed finding. 2. 11 mm nodular density at the left base favors nipple shadow. Recommend nonemergent follow-up chest x-ray which could use nipple markers. Electronically Signed   By: Marnee Spring M.D.   On: 03/30/2017 15:07    Microbiology: No results found for this or any previous visit (from the past 240 hour(s)).   Labs: Basic Metabolic Panel:  Recent Labs Lab 03/30/17 1514 03/30/17 1830 03/31/17 1510  NA 137  --  135  K 3.7  --  4.4  CL  103  --  104  CO2 24  --  24  GLUCOSE 121*  --  95  BUN 20  --  19  CREATININE 1.26*  --  1.14*  CALCIUM 9.4  --  8.9  MG  --  2.0  --    Liver Function Tests: No results for input(s): AST, ALT, ALKPHOS, BILITOT, PROT, ALBUMIN in the last 168 hours. No results for input(s): LIPASE, AMYLASE in the last 168 hours. No results for input(s): AMMONIA in the last 168 hours. CBC:  Recent Labs Lab 03/30/17 1514  WBC 7.3  HGB 14.9  HCT 43.6  MCV 97.8  PLT 208   Cardiac Enzymes:  Recent Labs Lab 03/30/17 1514 03/30/17 1830  TROPONINI <0.03 <0.03   BNP: BNP (last 3 results) No results for input(s): BNP in the last 8760 hours.  ProBNP (last 3 results) No results for input(s): PROBNP in the last 8760 hours.  CBG: No  results for input(s): GLUCAP in the last 168 hours.     SignedEdsel Petrin:  Maudean Hoffmann  [facility] 04/01/2017, 5:23 PM

## 2017-04-01 ENCOUNTER — Observation Stay (HOSPITAL_BASED_OUTPATIENT_CLINIC_OR_DEPARTMENT_OTHER): Payer: Medicare Other

## 2017-04-01 ENCOUNTER — Other Ambulatory Visit: Payer: Self-pay | Admitting: Emergency Medicine

## 2017-04-01 DIAGNOSIS — R0789 Other chest pain: Secondary | ICD-10-CM | POA: Diagnosis not present

## 2017-04-01 DIAGNOSIS — R079 Chest pain, unspecified: Secondary | ICD-10-CM | POA: Diagnosis not present

## 2017-04-01 DIAGNOSIS — I10 Essential (primary) hypertension: Secondary | ICD-10-CM | POA: Diagnosis not present

## 2017-04-01 DIAGNOSIS — I499 Cardiac arrhythmia, unspecified: Principal | ICD-10-CM

## 2017-04-01 DIAGNOSIS — I498 Other specified cardiac arrhythmias: Secondary | ICD-10-CM

## 2017-04-01 DIAGNOSIS — R9431 Abnormal electrocardiogram [ECG] [EKG]: Secondary | ICD-10-CM | POA: Diagnosis not present

## 2017-04-01 DIAGNOSIS — M109 Gout, unspecified: Secondary | ICD-10-CM | POA: Diagnosis not present

## 2017-04-01 DIAGNOSIS — N183 Chronic kidney disease, stage 3 (moderate): Secondary | ICD-10-CM | POA: Diagnosis not present

## 2017-04-01 LAB — ECHOCARDIOGRAM COMPLETE
Height: 64 in
WEIGHTICAEL: 2144 [oz_av]

## 2017-04-01 MED ORDER — ATENOLOL 25 MG PO TABS: 25.0000 mg | ORAL_TABLET | Freq: Every day | ORAL | 0 refills | Status: DC

## 2017-04-01 MED ORDER — ASPIRIN 325 MG PO TBEC: 325.0000 mg | DELAYED_RELEASE_TABLET | Freq: Every day | ORAL | 0 refills | Status: DC

## 2017-04-01 NOTE — Discharge Instructions (Signed)
Nonspecific Chest Pain °Chest pain can be caused by many different conditions. There is always a chance that your pain could be related to something serious, such as a heart attack or a blood clot in your lungs. Chest pain can also be caused by conditions that are not life-threatening. If you have chest pain, it is very important to follow up with your health care provider. °What are the causes? °Causes of this condition include: °· Heartburn. °· Pneumonia or bronchitis. °· Anxiety or stress. °· Inflammation around your heart (pericarditis) or lung (pleuritis or pleurisy). °· A blood clot in your lung. °· A collapsed lung (pneumothorax). This can develop suddenly on its own (spontaneous pneumothorax) or from trauma to the chest. °· Shingles infection (varicella-zoster virus). °· Heart attack. °· Damage to the bones, muscles, and cartilage that make up your chest wall. This can include: °? Bruised bones due to injury. °? Strained muscles or cartilage due to frequent or repeated coughing or overwork. °? Fracture to one or more ribs. °? Sore cartilage due to inflammation (costochondritis). ° °What increases the risk? °Risk factors for this condition may include: °· Activities that increase your risk for trauma or injury to your chest. °· Respiratory infections or conditions that cause frequent coughing. °· Medical conditions or overeating that can cause heartburn. °· Heart disease or family history of heart disease. °· Conditions or health behaviors that increase your risk of developing a blood clot. °· Having had chicken pox (varicella zoster). ° °What are the signs or symptoms? °Chest pain can feel like: °· Burning or tingling on the surface of your chest or deep in your chest. °· Crushing, pressure, aching, or squeezing pain. °· Dull or sharp pain that is worse when you move, cough, or take a deep breath. °· Pain that is also felt in your back, neck, shoulder, or arm, or pain that spreads to any of these  areas. ° °Your chest pain may come and go, or it may stay constant. °How is this diagnosed? °Lab tests or other studies may be needed to find the cause of your pain. Your health care provider may have you take a test called an ECG (electrocardiogram). An ECG records your heartbeat patterns at the time the test is performed. You may also have other tests, such as: °· Transthoracic echocardiogram (TTE). In this test, sound waves are used to create a picture of the heart structures and to look at how blood flows through your heart. °· Transesophageal echocardiogram (TEE). This is a more advanced imaging test that takes images from inside your body. It allows your health care provider to see your heart in finer detail. °· Cardiac monitoring. This allows your health care provider to monitor your heart rate and rhythm in real time. °· Holter monitor. This is a portable device that records your heartbeat and can help to diagnose abnormal heartbeats. It allows your health care provider to track your heart activity for several days, if needed. °· Stress tests. These can be done through exercise or by taking medicine that makes your heart beat more quickly. °· Blood tests. °· Other imaging tests. ° °How is this treated? °Treatment depends on what is causing your chest pain. Treatment may include: °· Medicines. These may include: °? Acid blockers for heartburn. °? Anti-inflammatory medicine. °? Pain medicine for inflammatory conditions. °? Antibiotic medicine, if an infection is present. °? Medicines to dissolve blood clots. °? Medicines to treat coronary artery disease (CAD). °· Supportive care for conditions that   do not require medicines. This may include: °? Resting. °? Applying heat or cold packs to injured areas. °? Limiting activities until pain decreases. ° °Follow these instructions at home: °Medicines °· If you were prescribed an antibiotic, take it as told by your health care provider. Do not stop taking the  antibiotic even if you start to feel better. °· Take over-the-counter and prescription medicines only as told by your health care provider. °Lifestyle °· Do not use any products that contain nicotine or tobacco, such as cigarettes and e-cigarettes. If you need help quitting, ask your health care provider. °· Do not drink alcohol. °· Make lifestyle changes as directed by your health care provider. These may include: °? Getting regular exercise. Ask your health care provider to suggest some activities that are safe for you. °? Eating a heart-healthy diet. A registered dietitian can help you to learn healthy eating options. °? Maintaining a healthy weight. °? Managing diabetes, if necessary. °? Reducing stress, such as with yoga or relaxation techniques. °General instructions °· Avoid any activities that bring on chest pain. °· If heartburn is the cause for your chest pain, raise (elevate) the head of your bed about 6 inches (15 cm) by putting blocks under the legs. Sleeping with more pillows does not effectively relieve heartburn because it only changes the position of your head. °· Keep all follow-up visits as told by your health care provider. This is important. This includes any further testing if your chest pain does not go away. °Contact a health care provider if: °· Your chest pain does not go away. °· You have a rash with blisters on your chest. °· You have a fever. °· You have chills. °Get help right away if: °· Your chest pain is worse. °· You have a cough that gets worse, or you cough up blood. °· You have severe pain in your abdomen. °· You have severe weakness. °· You faint. °· You have sudden, unexplained chest discomfort. °· You have sudden, unexplained discomfort in your arms, back, neck, or jaw. °· You have shortness of breath at any time. °· You suddenly start to sweat, or your skin gets clammy. °· You feel nauseous or you vomit. °· You suddenly feel light-headed or dizzy. °· Your heart begins to beat  quickly, or it feels like it is skipping beats. °These symptoms may represent a serious problem that is an emergency. Do not wait to see if the symptoms will go away. Get medical help right away. Call your local emergency services (911 in the [place] Do not drive yourself to the hospital. °This information is not intended to replace advice given to you by your health care provider. Make sure you discuss any questions you have with your health care provider. °Document Released: 07/26/2005 Document Revised: 07/10/2016 Document Reviewed: 07/10/2016 °Elsevier Interactive Patient Education © 2017 [employer] ° °

## 2017-04-01 NOTE — Progress Notes (Signed)
Samul DadaYvonne L Choate to be D/C'd Home per MD order. Discussed with the patient and all questions fully answered.    VVS, Skin clean, dry and intact without evidence of skin break down, no evidence of skin tears noted.  IV catheter discontinued intact. Site without signs and symptoms of complications. Dressing and pressure applied.  An After Visit Summary was printed and given to the patient.  Patient escorted via WC, and D/C home via private auto.  Kai LevinsJacobs, Ashiyah Pavlak N  04/01/2017 6:22 PM

## 2017-04-01 NOTE — Progress Notes (Signed)
Trop negative x 2.  2D echo pending.  If LVF is normal then per Dr. Lubertha Basqueaylor's note we will arrange outpt nuclear stress test and event monitor for PVCs.  If EF abnormal then will set up right and left heart cath for tomorrow.

## 2017-04-02 ENCOUNTER — Telehealth: Payer: Self-pay

## 2017-04-02 ENCOUNTER — Ambulatory Visit: Payer: Self-pay | Admitting: Physician Assistant

## 2017-04-02 ENCOUNTER — Telehealth (HOSPITAL_COMMUNITY): Payer: Self-pay | Admitting: *Deleted

## 2017-04-02 DIAGNOSIS — R079 Chest pain, unspecified: Secondary | ICD-10-CM

## 2017-04-02 NOTE — Telephone Encounter (Signed)
Entered order for [employer] myoview, per Dr. Lubertha Basqueaylor's order.

## 2017-04-02 NOTE — Progress Notes (Deleted)
Cardiology Office Note    Date:  04/02/2017  ID:  ALELI NAVEDO, DOB Jun 17, 1934, MRN [id number] PCP:  Louie Boston., MD  Cardiologist:  Seen by Dr. Ladona Ridgel last week  Chief Complaint: f/u chest pressure/SOB  History of Present Illness:  Rhonda Spears is a 83 y.o. female with history of HTN, gout, probable CKD III per labs who presents for f/u of chest pain. Per recent cardiology consult note, she had a heart cath 15 years ago and was told it was OK. Over the last few months she has had worsening SOB and chest pressure. Her PCP empirically prescribed Plavix for general prophylaxis although she had not yet started this. Cardiology appt was set up but she was subsequently admitted 03/30/17. She also had some associated palpitations and PVCs. CXR showed COPD and scarring without superimposed finding, 11mm nodular density favoring nipple shadow. Troponins were negative. 2D echo 04/01/17 showed mild focal basal hypertrophy, EF 55-60%, grade 2 DD, high ventricular filling pressure. Most recent labs showed Cr 1.14-1.26, K 4.4, TSH wnl, Mg 2.0, normal CBC. She was also noted to have bradycardia with HR as low as 30, prompting reduction of atenolol to 25mg  daily. Notes also reference ventricular bigeminy with recommendation for OP stress test and event monitor.  copd?cxr bnp  Chest pressure/dyspnea PVCs Bradycardia Abnormal CXR    Past Medical History:  Diagnosis Date  . Gout   . Hypertension   . Renal disorder     Past Surgical History:  Procedure Laterality Date  . arthroscopic left knee    . repair left femoral neck    . right medial men      Current Medications: Outpatient Medications Prior to Visit  Medication Sig Dispense Refill  . allopurinol (ZYLOPRIM) 100 MG tablet Take 100 mg by mouth at bedtime. 2 tabs hs    . amitriptyline (ELAVIL) 10 MG tablet Take 5 mg by mouth at bedtime. 1/2 tab hs restless leg     . aspirin EC 325 MG EC tablet Take 1 tablet (325 mg total) by mouth  daily. 30 tablet 0  . atenolol (TENORMIN) 25 MG tablet Take 1 tablet (25 mg total) by mouth daily. 30 tablet 0  . Calcium Carb-Cholecalciferol (CALCIUM 600+D) 600-800 MG-UNIT TABS Take 1 tablet by mouth daily.    . Carboxymethylcellul-Glycerin (CLEAR EYES FOR DRY EYES OP) Place 1-2 drops into both eyes daily as needed.    . cholecalciferol (VITAMIN D) 1000 units tablet Take 1,000 Units by mouth daily.    . clopidogrel (PLAVIX) 75 MG tablet Take 75 mg by mouth daily.    . folic acid (FOLVITE) 400 MCG tablet Take 400 mcg by mouth at bedtime.    Marland Kitchen HYDROcodone-acetaminophen (NORCO/VICODIN) 5-325 MG tablet Take 1 tablet by mouth every 6 (six) hours as needed for moderate pain.    . Magnesium 250 MG TABS Take 1 tablet by mouth daily.    . Multiple Vitamin (MULTIVITAMIN) capsule Take 1 capsule by mouth daily.    Marland Kitchen spironolactone (ALDACTONE) 25 MG tablet Take 25 mg by mouth daily.      No facility-administered medications prior to visit.      Allergies:   Latex; Omeprazole; Tramadol; Sulfa antibiotics; and Zanaflex [tizanidine hcl]   Social History   Social History  . Marital status: Divorced    Spouse name: N/A  . Number of children: N/A  . Years of education: N/A   Social History Main Topics  . Smoking status: Never Smoker  .  Smokeless tobacco: Never Used  . Alcohol use No  . Drug use: No  . Sexual activity: Not on file   Other Topics Concern  . Not on file   Social History Narrative  . No narrative on file     Family History:  Family History  Problem Relation Age of Onset  . Heart attack Mother   . Hypertension Mother    ***  ROS:   Please see the history of present illness. Otherwise, review of systems is positive for ***.  All other systems are reviewed and otherwise negative.    PHYSICAL EXAM:   VS:  There were no vitals taken for this visit.  BMI: There is no height or weight on file to calculate BMI. GEN: Well nourished, well developed, in no acute distress    HEENT: normocephalic, atraumatic Neck: no JVD, carotid bruits, or masses Cardiac: ***RRR; no murmurs, rubs, or gallops, no edema  Respiratory:  clear to auscultation bilaterally, normal work of breathing GI: soft, nontender, nondistended, + BS MS: no deformity or atrophy  Skin: warm and dry, no rash Neuro:  Alert and Oriented x 3, Strength and sensation are intact, follows commands Psych: euthymic mood, full affect  Wt Readings from Last 3 Encounters:  03/30/17 134 lb (60.8 kg)      Studies/Labs Reviewed:   EKG:  EKG was ordered today and personally reviewed by me and demonstrates *** EKG was not ordered today.***  Recent Labs: 03/30/2017: Hemoglobin 14.9; Magnesium 2.0; Platelets 208 03/31/2017: BUN 19; Creatinine, Ser 1.14; Potassium 4.4; Sodium 135; TSH 2.418   Lipid Panel No results found for: CHOL, TRIG, HDL, CHOLHDL, VLDL, LDLCALC, LDLDIRECT  Additional studies/ records that were reviewed today include: Summarized above.***    ASSESSMENT & PLAN:   1. ***  Disposition: F/u with ***   Medication Adjustments/Labs and Tests Ordered: Current medicines are reviewed at length with the patient today.  Concerns regarding medicines are outlined above. Medication changes, Labs and Tests ordered today are summarized above and listed in the Patient Instructions accessible in Encounters.   Signed, Laurann MontanaDayna N Dunn, PA-C  04/02/2017 7:40 AM    [facility] [place] Location in [facility] [address] [place], [place] [address] Ph: [phone]) [phone]; Fax [phone]) [phone]

## 2017-04-02 NOTE — Telephone Encounter (Signed)
Attempted to call patient regarding upcoming appointment- busy x 2

## 2017-04-03 ENCOUNTER — Ambulatory Visit (HOSPITAL_COMMUNITY): Payer: Medicare Other | Attending: Cardiovascular Disease

## 2017-04-03 DIAGNOSIS — R079 Chest pain, unspecified: Secondary | ICD-10-CM | POA: Diagnosis present

## 2017-04-03 MED ORDER — TECHNETIUM TC 99M TETROFOSMIN IV KIT
31.0000 | PACK | Freq: Once | INTRAVENOUS | Status: AC | PRN
  Administered 2017-04-03: 31 via INTRAVENOUS
  Filled 2017-04-03: qty 31

## 2017-04-09 ENCOUNTER — Ambulatory Visit (HOSPITAL_COMMUNITY): Payer: Medicare Other | Attending: Cardiology

## 2017-04-09 MED ORDER — TECHNETIUM TC 99M TETROFOSMIN IV KIT
32.6000 | PACK | Freq: Once | INTRAVENOUS | Status: AC | PRN
  Administered 2017-04-09: 32.6 via INTRAVENOUS
  Filled 2017-04-09: qty 33

## 2017-04-09 MED ORDER — REGADENOSON 0.4 MG/5ML IV SOLN
0.4000 mg | Freq: Once | INTRAVENOUS | Status: AC
  Administered 2017-04-09: 0.4 mg via INTRAVENOUS

## 2017-04-10 LAB — MYOCARDIAL PERFUSION IMAGING
CHL CUP NUCLEAR SDS: 3
CHL CUP NUCLEAR SRS: 11
CHL CUP NUCLEAR SSS: 10
LHR: 0.31
Peak HR: 71 {beats}/min
Rest HR: 53 {beats}/min
TID: 0.86

## 2017-04-12 ENCOUNTER — Telehealth: Payer: Self-pay | Admitting: Internal Medicine

## 2017-04-12 NOTE — Telephone Encounter (Signed)
New Message    Pt is calling for her myocardial perfusion result

## 2017-04-12 NOTE — Telephone Encounter (Signed)
Called, pt unavailable. Informed to call our office back to review results of myoview on 04/03/17.

## 2017-04-13 NOTE — Telephone Encounter (Signed)
Patient returning call for Myoview results.

## 2017-04-13 NOTE — Telephone Encounter (Signed)
Study Highlights     There was no ST segment deviation noted during stress.  The study is normal.  This is a low risk study.   Normal resting and stress perfusion. No ischemia or infarction EF Not gated due to frequent PVC;s     Informed patient of results and verbal understanding expressed.

## 2017-04-17 ENCOUNTER — Encounter: Payer: Self-pay | Admitting: Internal Medicine

## 2017-04-17 ENCOUNTER — Ambulatory Visit (INDEPENDENT_AMBULATORY_CARE_PROVIDER_SITE_OTHER): Payer: Medicare Other | Admitting: Internal Medicine

## 2017-04-17 VITALS — BP 110/56 | HR 48 | Ht 64.0 in | Wt 136.0 lb

## 2017-04-17 DIAGNOSIS — R002 Palpitations: Secondary | ICD-10-CM

## 2017-04-17 NOTE — Progress Notes (Signed)
HPI Rhonda Spears returns today for followup. She is a pleasant 83 yo woman who I met a few weeks ago when she presented with symptomatic bradycardia, and palpitations. Her symptoms resolved with reduction in her beta blocker. She also had atypical chest pain and a stress test and echo were normal. She feels well and her palpitations have been quiet. She still gets easily fatigued.  Allergies  Allergen Reactions  . Latex Itching  . Omeprazole Palpitations  . Tramadol Itching and Nausea Only    No hives  . Sulfa Antibiotics Nausea Only and Other (See Comments)    Severe headaches  . Zanaflex [Tizanidine Hcl] Itching and Other (See Comments)    oversedation     Current Outpatient Prescriptions  Medication Sig Dispense Refill  . allopurinol (ZYLOPRIM) 100 MG tablet Take 100 mg by mouth at bedtime. 2 tabs hs    . amitriptyline (ELAVIL) 10 MG tablet Take 5 mg by mouth at bedtime. 1/2 tab hs restless leg     . atenolol (TENORMIN) 25 MG tablet Take 1 tablet (25 mg total) by mouth daily. 30 tablet 0  . Calcium Carb-Cholecalciferol (CALCIUM 600+D) 600-800 MG-UNIT TABS Take 1 tablet by mouth daily.    . cholecalciferol (VITAMIN D) 1000 units tablet Take 1,000 Units by mouth daily.    . folic acid (FOLVITE) 086 MCG tablet Take 400 mcg by mouth at bedtime.    Marland Kitchen HYDROcodone-acetaminophen (NORCO/VICODIN) 5-325 MG tablet Take 1 tablet by mouth every 6 (six) hours as needed for moderate pain.    . Magnesium 250 MG TABS Take 1 tablet by mouth daily.    . Multiple Vitamin (MULTIVITAMIN) capsule Take 1 capsule by mouth daily.    Marland Kitchen spironolactone (ALDACTONE) 25 MG tablet Take 25 mg by mouth daily.     . clopidogrel (PLAVIX) 75 MG tablet Take 75 mg by mouth daily.     No current facility-administered medications for this visit.      Past Medical History:  Diagnosis Date  . Gout   . Hypertension   . Renal disorder     ROS:   All systems reviewed and negative except as noted in the  HPI.   Past Surgical History:  Procedure Laterality Date  . arthroscopic left knee    . repair left femoral neck    . right medial men       Family History  Problem Relation Age of Onset  . Heart attack Mother   . Hypertension Mother      Social History   Social History  . Marital status: Divorced    Spouse name: N/A  . Number of children: N/A  . Years of education: N/A   Occupational History  . Not on file.   Social History Main Topics  . Smoking status: Never Smoker  . Smokeless tobacco: Never Used  . Alcohol use No  . Drug use: No  . Sexual activity: Not on file   Other Topics Concern  . Not on file   Social History Narrative  . No narrative on file     BP (!) 110/56   Pulse (!) 48   Ht _0  (1.626 m)   Wt 136 lb (61.7 kg)   SpO2 97%   BMI 23.34 kg/m   Physical Exam:  Well appearing kyphotic elderly woman, NAD HEENT: Unremarkable Neck:  6 cm JVD, no thyromegally Lymphatics:  No adenopathy Back:  No CVA tenderness Lungs:  Clear with no  wheezes HEART:  Regular rate rhythm, no murmurs, no rubs, no clicks Abd:  soft, positive bowel sounds, no organomegally, no rebound, no guarding Ext:  2 plus pulses, no edema, no cyanosis, no clubbing Skin:  No rashes no nodules Neuro:  CN II through XII intact, motor grossly intact  2D echo - reivewed  GXT - reviewed  Assess/Plan: 1. Sinus node dysfunction - she is asymptomatic. She will undergo watchful waiting 2. Palpitations - no evidence of atrial fib. I have asked her to stop her plavix and take ASA 81 mg daily 3. HTN - her blood pressure today is well controlled. She is encouraged to reduce her salt intake. 4. Dyspnea - this is multifactorial. She is deconditioned. I have asked her to start walking.  Rhonda Spears.D.

## 2017-04-17 NOTE — Patient Instructions (Addendum)
Medication Instructions:  Your physician recommends that you continue on your current medications as directed. Please refer to the Current Medication list given to you today.You do NOT have to take Plavix, take Aspirin 81 mg enteric coated daily   Labwork: none  Testing/Procedures: none  Follow-Up: Your physician wants you to follow-up in: 6 months .  You will receive a reminder letter in the mail two months in advance. If you don't receive a letter, please call our office to schedule the follow-up appointment.   Any Other Special Instructions Will Be Listed Below (If Applicable).     If you need a refill on your cardiac medications before your next appointment, please call your pharmacy.

## 2017-10-01 ENCOUNTER — Ambulatory Visit (INDEPENDENT_AMBULATORY_CARE_PROVIDER_SITE_OTHER): Payer: Medicare Other | Admitting: Internal Medicine

## 2017-10-01 ENCOUNTER — Encounter: Payer: Self-pay | Admitting: Internal Medicine

## 2017-10-01 VITALS — BP 122/62 | HR 44 | Ht 64.5 in | Wt 135.0 lb

## 2017-10-01 DIAGNOSIS — I495 Sick sinus syndrome: Secondary | ICD-10-CM

## 2017-10-01 NOTE — Patient Instructions (Signed)
Your physician wants you to follow-up in: 1 year with Dr.Taylor You will receive a reminder letter in the mail two months in advance. If you don't receive a letter, please call our office to schedule the follow-up appointment.    Your physician recommends that you continue on your current medications as directed. Please refer to the Current Medication list given to you today.    If you need a refill on your cardiac medications before your next appointment, please call your pharmacy.      No lab work or tests ordered today.      Thank you for choosing [facility] !

## 2017-10-01 NOTE — Progress Notes (Signed)
HPI [name] returns today for ongoing evaluation and management of symptomatic PVC's and sinus node dysfunction. She is a pleasant 83 yo woman with a h/oHTN and arthritis who I saw almost six months ago. She has done well in the interim. No syncope. Her palpitations have improved. No syncope. Allergies  Allergen Reactions  . Latex Itching  . Omeprazole Palpitations  . Tramadol Itching and Nausea Only    No hives  . Sulfa Antibiotics Nausea Only and Other (See Comments)    Severe headaches  . Zanaflex [Tizanidine Hcl] Itching and Other (See Comments)    oversedation     Current Outpatient Medications  Medication Sig Dispense Refill  . allopurinol (ZYLOPRIM) 100 MG tablet Take 100 mg by mouth at bedtime. 2 tabs hs    . amitriptyline (ELAVIL) 10 MG tablet Take 5 mg by mouth at bedtime. 1/2 tab hs restless leg     . aspirin EC 81 MG tablet Take 81 mg by mouth daily.    Marland Kitchen. atenolol (TENORMIN) 25 MG tablet Take 1 tablet (25 mg total) by mouth daily. 30 tablet 0  . Calcium Carb-Cholecalciferol (CALCIUM 600+D) 600-800 MG-UNIT TABS Take 1 tablet by mouth daily.    . cholecalciferol (VITAMIN D) 1000 units tablet Take 1,000 Units by mouth daily.    . folic acid (FOLVITE) 400 MCG tablet Take 400 mcg by mouth at bedtime.    Marland Kitchen. HYDROcodone-acetaminophen (NORCO/VICODIN) 5-325 MG tablet Take 1 tablet by mouth every 6 (six) hours as needed for moderate pain.    . Magnesium 250 MG TABS Take 1 tablet by mouth daily.    . Multiple Vitamin (MULTIVITAMIN) capsule Take 1 capsule by mouth daily.    Marland Kitchen. spironolactone (ALDACTONE) 25 MG tablet Take 25 mg by mouth daily.      No current facility-administered medications for this visit.      Past Medical History:  Diagnosis Date  . Gout   . Hypertension   . Renal disorder     ROS:   All systems reviewed and negative except as noted in the HPI.   Past Surgical History:  Procedure Laterality Date  . arthroscopic left knee    . repair  left femoral neck    . right medial men       Family History  Problem Relation Age of Onset  . Heart attack Mother   . Hypertension Mother      Social History   Socioeconomic History  . Marital status: Divorced    Spouse name: Not on file  . Number of children: Not on file  . Years of education: Not on file  . Highest education level: Not on file  Social Needs  . Financial resource strain: Not on file  . Food insecurity - worry: Not on file  . Food insecurity - inability: Not on file  . Transportation needs - medical: Not on file  . Transportation needs - non-medical: Not on file  Occupational History  . Not on file  Tobacco Use  . Smoking status: Never Smoker  . Smokeless tobacco: Never Used  Substance and Sexual Activity  . Alcohol use: No  . Drug use: No  . Sexual activity: Not on file  Other Topics Concern  . Not on file  Social History Narrative  . Not on file     BP 122/62   Pulse (!) 44   Ht 5' 4.5" (1.638 m)   Wt 135 lb (61.2 kg)  SpO2 97%   BMI 22.81 kg/m   Physical Exam:  Well appearing 83 yo woman, NAD HEENT: Unremarkable Neck:  7 cm JVD, no thyromegally Lymphatics:  No adenopathy Back:  No CVA tenderness Lungs:  Clear with no wheezes HEART:  IRegular rate rhythm, no murmurs, no rubs, no clicks Abd:  soft, positive bowel sounds, no organomegally, no rebound, no guarding Ext:  2 plus pulses, no edema, no cyanosis, no clubbing Skin:  No rashes no nodules Neuro:  CN II through XII intact, motor grossly intact  Assess/Plan: 1.sinus node dysfunction - she is without syncope and overall feels well. She will continue her current meds. We discussed the symptoms that she might experience if she develops symptomatic bradycardia and she is instructed to call us if she experiences any. 2. PVC's - on exam, her PVC's are still present but she appears to be asymptomatic. No change in meds. 3. HTN - her blood pressure is well controlled. No change in her  meds.

## 2018-04-18 ENCOUNTER — Emergency Department (HOSPITAL_COMMUNITY): Payer: Medicare Other

## 2018-04-18 ENCOUNTER — Other Ambulatory Visit: Payer: Self-pay

## 2018-04-18 ENCOUNTER — Observation Stay (HOSPITAL_COMMUNITY)
Admission: EM | Admit: 2018-04-18 | Discharge: 2018-04-20 | Disposition: A | Discharge status: Home / Self Care | Payer: Medicare Other | Attending: Internal Medicine | Admitting: Internal Medicine

## 2018-04-18 ENCOUNTER — Encounter (HOSPITAL_COMMUNITY): Payer: Self-pay | Admitting: Emergency Medicine

## 2018-04-18 ENCOUNTER — Telehealth: Payer: Self-pay | Admitting: Internal Medicine

## 2018-04-18 DIAGNOSIS — N183 Chronic kidney disease, stage 3 unspecified: Secondary | ICD-10-CM

## 2018-04-18 DIAGNOSIS — R079 Chest pain, unspecified: Secondary | ICD-10-CM | POA: Diagnosis not present

## 2018-04-18 DIAGNOSIS — I129 Hypertensive chronic kidney disease with stage 1 through stage 4 chronic kidney disease, or unspecified chronic kidney disease: Secondary | ICD-10-CM | POA: Insufficient documentation

## 2018-04-18 DIAGNOSIS — R0600 Dyspnea, unspecified: Secondary | ICD-10-CM | POA: Diagnosis not present

## 2018-04-18 DIAGNOSIS — Z91040 Latex allergy status: Secondary | ICD-10-CM | POA: Insufficient documentation

## 2018-04-18 DIAGNOSIS — R600 Localized edema: Secondary | ICD-10-CM

## 2018-04-18 DIAGNOSIS — I10 Essential (primary) hypertension: Secondary | ICD-10-CM | POA: Diagnosis present

## 2018-04-18 DIAGNOSIS — Z79899 Other long term (current) drug therapy: Secondary | ICD-10-CM | POA: Insufficient documentation

## 2018-04-18 DIAGNOSIS — R0609 Other forms of dyspnea: Secondary | ICD-10-CM

## 2018-04-18 DIAGNOSIS — Z7982 Long term (current) use of aspirin: Secondary | ICD-10-CM | POA: Diagnosis not present

## 2018-04-18 DIAGNOSIS — R0602 Shortness of breath: Secondary | ICD-10-CM | POA: Diagnosis present

## 2018-04-18 HISTORY — DX: Chest pain, unspecified: R07.9

## 2018-04-18 HISTORY — DX: Ventricular premature depolarization: I49.3

## 2018-04-18 LAB — BASIC METABOLIC PANEL
Anion gap: 11 (ref 5–15)
BUN: 24 mg/dL — ABNORMAL HIGH (ref 6–20)
CALCIUM: 9.7 mg/dL (ref 8.9–10.3)
CHLORIDE: 101 mmol/L (ref 101–111)
CO2: 23 mmol/L (ref 22–32)
CREATININE: 1.35 mg/dL — AB (ref 0.44–1.00)
GFR calc non Af Amer: 36 mL/min — ABNORMAL LOW (ref >60)
GFR, EST AFRICAN AMERICAN: 41 mL/min — AB (ref >60)
GLUCOSE: 110 mg/dL — AB (ref 65–99)
Potassium: 4.1 mmol/L (ref 3.5–5.1)
Sodium: 135 mmol/L (ref 135–145)

## 2018-04-18 LAB — CBC WITH DIFFERENTIAL/PLATELET
BASOS PCT: 0 %
Basophils Absolute: 0 10*3/uL (ref 0.0–0.1)
Eosinophils Absolute: 0.2 10*3/uL (ref 0.0–0.7)
Eosinophils Relative: 2 %
HEMATOCRIT: 42.4 % (ref 36.0–46.0)
HEMOGLOBIN: 14.3 g/dL (ref 12.0–15.0)
LYMPHS ABS: 2.6 10*3/uL (ref 0.7–4.0)
Lymphocytes Relative: 28 %
MCH: 33.6 pg (ref 26.0–34.0)
MCHC: 33.7 g/dL (ref 30.0–36.0)
MCV: 99.8 fL (ref 78.0–100.0)
MONO ABS: 0.8 10*3/uL (ref 0.1–1.0)
MONOS PCT: 8 %
NEUTROS ABS: 5.9 10*3/uL (ref 1.7–7.7)
NEUTROS PCT: 62 %
Platelets: 185 10*3/uL (ref 150–400)
RBC: 4.25 MIL/uL (ref 3.87–5.11)
RDW: 14 % (ref 11.5–15.5)
WBC: 9.5 10*3/uL (ref 4.0–10.5)

## 2018-04-18 LAB — TROPONIN I

## 2018-04-18 LAB — BRAIN NATRIURETIC PEPTIDE: B Natriuretic Peptide: 184 pg/mL — ABNORMAL HIGH (ref 0.0–100.0)

## 2018-04-18 MED ORDER — ONDANSETRON HCL 4 MG/2ML IJ SOLN: 4.0000 mg | Freq: Four times a day (QID) | INTRAMUSCULAR | Status: DC | PRN

## 2018-04-18 MED ORDER — SODIUM CHLORIDE 0.9 % IV SOLN
INTRAVENOUS | Status: DC
  Administered 2018-04-19: via INTRAVENOUS

## 2018-04-18 MED ORDER — ALBUTEROL SULFATE (2.5 MG/3ML) 0.083% IN NEBU: 2.5000 mg | INHALATION_SOLUTION | RESPIRATORY_TRACT | Status: DC | PRN

## 2018-04-18 MED ORDER — MAGNESIUM 200 MG PO TABS
1.0000 | ORAL_TABLET | Freq: Every day | ORAL | Status: DC
  Filled 2018-04-18 (×2): qty 1

## 2018-04-18 MED ORDER — SPIRONOLACTONE 25 MG PO TABS: 25.0000 mg | ORAL_TABLET | Freq: Every day | ORAL | Status: DC

## 2018-04-18 MED ORDER — ALLOPURINOL 100 MG PO TABS
100.0000 mg | ORAL_TABLET | Freq: Every day | ORAL | Status: DC
  Administered 2018-04-19 (×2): 100 mg via ORAL
  Filled 2018-04-18 (×2): qty 1

## 2018-04-18 MED ORDER — ACETAMINOPHEN 650 MG RE SUPP: 650.0000 mg | Freq: Four times a day (QID) | RECTAL | Status: DC | PRN

## 2018-04-18 MED ORDER — ASPIRIN EC 81 MG PO TBEC
81.0000 mg | DELAYED_RELEASE_TABLET | Freq: Every day | ORAL | Status: DC
  Administered 2018-04-19 – 2018-04-20 (×2): 81 mg via ORAL
  Filled 2018-04-18 (×2): qty 1

## 2018-04-18 MED ORDER — ENOXAPARIN SODIUM 30 MG/0.3ML ~~LOC~~ SOLN
30.0000 mg | SUBCUTANEOUS | Status: DC
  Administered 2018-04-19 (×2): 30 mg via SUBCUTANEOUS
  Filled 2018-04-18 (×2): qty 0.3

## 2018-04-18 MED ORDER — ATENOLOL 25 MG PO TABS
12.5000 mg | ORAL_TABLET | Freq: Every day | ORAL | Status: DC
  Administered 2018-04-19 – 2018-04-20 (×2): 12.5 mg via ORAL
  Filled 2018-04-18 (×2): qty 1

## 2018-04-18 MED ORDER — AMITRIPTYLINE HCL 10 MG PO TABS
5.0000 mg | ORAL_TABLET | Freq: Every day | ORAL | Status: DC
  Administered 2018-04-19 (×2): 5 mg via ORAL
  Filled 2018-04-18 (×2): qty 1

## 2018-04-18 MED ORDER — ONDANSETRON HCL 4 MG PO TABS: 4.0000 mg | ORAL_TABLET | Freq: Four times a day (QID) | ORAL | Status: DC | PRN

## 2018-04-18 MED ORDER — FOLIC ACID 1 MG PO TABS
500.0000 ug | ORAL_TABLET | Freq: Every day | ORAL | Status: DC
  Administered 2018-04-19 (×2): 0.5 mg via ORAL
  Filled 2018-04-18 (×3): qty 1

## 2018-04-18 MED ORDER — ACETAMINOPHEN 325 MG PO TABS
650.0000 mg | ORAL_TABLET | Freq: Four times a day (QID) | ORAL | Status: DC | PRN
  Administered 2018-04-19: 650 mg via ORAL
  Filled 2018-04-18: qty 2

## 2018-04-18 NOTE — Telephone Encounter (Signed)
Per pt phone call--having SOB, blurred vision. At last office visit w/ Dr. Ladona Ridgelaylor he told her she needed a pacemaker and that if she had any of these symptoms to call the office right away. Please give pt a call

## 2018-04-18 NOTE — H&P (Signed)
TRH H&P    Patient Demographics:    Rhonda Spears, is a 84 y.o. female  MRN: [id number]  DOB - 11/26/1933  Admit Date - 04/18/2018  Referring MD/NP/PA: Dr. Estell HarpinZammit  Outpatient Primary MD for the patient is Kela MillinBarrino, Alethea Y, MD  Patient coming from: Home  Chief complaint-shortness of breath   HPI:    Rhonda Spears  is a 84 y.o. female, with history of sinus node dysfunction, hypertension, COPD came to hospital with complaints of shortness of breath.  Patient says that she gets these symptoms intermittently also complains of pressure.  Denies coughing up any phlegm.  No fever or chills. No nausea vomiting or diarrhea. She was seen by Dr. Ladona Ridgelaylor on 04/17/2017 for sinus node dysfunction and he recommended watchful waiting. In the ED, chest x-ray showed no acute abnormality.    Review of systems:      All other systems reviewed and are negative.   With Past History of the following :    Past Medical History:  Diagnosis Date  . Gout   . Hypertension   . Irregular heart beat   . Renal disorder       Past Surgical History:  Procedure Laterality Date  . arthroscopic left knee    . repair left femoral neck    . right medial men        Social History:      Social History   Tobacco Use  . Smoking status: Never Smoker  . Smokeless tobacco: Never Used  Substance Use Topics  . Alcohol use: No       Family History :     Family History  Problem Relation Age of Onset  . Heart attack Mother   . Hypertension Mother       Home Medications:   Prior to Admission medications   Medication Sig Start Date End Date Taking? Authorizing Provider  allopurinol (ZYLOPRIM) 100 MG tablet Take 100 mg by mouth at bedtime. 2 tabs hs    [provider]  amitriptyline (ELAVIL) 10 MG tablet Take 5 mg by mouth at bedtime. 1/2 tab hs restless leg     [provider]  aspirin EC 81 MG  tablet Take 81 mg by mouth daily.    [provider]  atenolol (TENORMIN) 25 MG tablet Take 1 tablet (25 mg total) by mouth daily. 04/01/17   Mikhail, Nita SellsMaryann, DO  Calcium Carb-Cholecalciferol (CALCIUM 600+D) 600-800 MG-UNIT TABS Take 1 tablet by mouth daily.    [provider]  cholecalciferol (VITAMIN D) 1000 units tablet Take 1,000 Units by mouth daily.    [provider]  folic acid (FOLVITE) 400 MCG tablet Take 400 mcg by mouth at bedtime.    [provider]  HYDROcodone-acetaminophen (NORCO/VICODIN) 5-325 MG tablet Take 1 tablet by mouth every 6 (six) hours as needed for moderate pain.    [provider]  Magnesium 250 MG TABS Take 1 tablet by mouth daily.    [provider]  Multiple Vitamin (MULTIVITAMIN) capsule Take 1 capsule by  mouth daily.    [provider]  spironolactone (ALDACTONE) 25 MG tablet Take 25 mg by mouth daily.     [provider]     Allergies:     Allergies  Allergen Reactions  . Latex Itching  . Omeprazole Palpitations  . Tramadol Itching and Nausea Only    No hives  . Sulfa Antibiotics Nausea Only and Other (See Comments)    Severe headaches  . Zanaflex [Tizanidine Hcl] Itching and Other (See Comments)    oversedation     Physical Exam:   Vitals  Blood pressure 125/61, pulse (!) 35, temperature 98.1 F (36.7 C), temperature source Oral, resp. rate 17, height 5\' 2"  (1.575 m), weight 62.1 kg (137 lb), SpO2 97 %.  1.  General: Appears in no acute distress  2. Psychiatric:  Intact judgement and  insight, awake alert, oriented x 3.  3. Neurologic: No focal neurological deficits, all cranial nerves intact.Strength 5/5 all 4 extremities, sensation intact all 4 extremities, plantars down going.  4. Eyes :  anicteric sclerae, moist conjunctivae with no lid lag. PERRLA.  5. ENMT:  Oropharynx clear with moist mucous membranes and good dentition  6. Neck:  supple, no cervical  lymphadenopathy appriciated, No thyromegaly  7. Respiratory : Normal respiratory effort, good air movement bilaterally,clear to  auscultation bilaterally  8. Cardiovascular : RRR, no gallops, rubs or murmurs, no leg edema  9. Gastrointestinal:  Positive bowel sounds, abdomen soft, non-tender to palpation,no hepatosplenomegaly, no rigidity or guarding       10. Skin:  No cyanosis, normal texture and turgor, no rash, lesions or ulcers  11.Musculoskeletal:  Good muscle tone,  joints appear normal , no effusions,  normal range of motion    Data Review:    CBC Recent Labs  Lab 04/18/18 1853  WBC 9.5  HGB 14.3  HCT 42.4  PLT 185  MCV 99.8  MCH 33.6  MCHC 33.7  RDW 14.0  LYMPHSABS 2.6  MONOABS 0.8  EOSABS 0.2  BASOSABS 0.0   ------------------------------------------------------------------------------------------------------------------  Chemistries  Recent Labs  Lab 04/18/18 1853  NA 135  K 4.1  CL 101  CO2 23  GLUCOSE 110*  BUN 24*  CREATININE 1.35*  CALCIUM 9.7   ------------------------------------------------------------------------------------------------------------------  ------------------------------------------------------------------------------------------------------------------ GFR: Estimated Creatinine Clearance: 28.3 mL/min (A) (by C-G formula based on SCr of 1.35 mg/dL (H)). Liver Function Tests: No results for input(s): AST, ALT, ALKPHOS, BILITOT, PROT, ALBUMIN in the last 168 hours. No results for input(s): LIPASE, AMYLASE in the last 168 hours. No results for input(s): AMMONIA in the last 168 hours. Coagulation Profile: No results for input(s): INR, PROTIME in the last 168 hours. Cardiac Enzymes: Recent Labs  Lab 04/18/18 1853  TROPONINI <0.03    --------------------------------------------------------------------------------------------------------------- Urine analysis: No results found for: COLORURINE, APPEARANCEUR, LABSPEC,  PHURINE, GLUCOSEU, HGBUR, BILIRUBINUR, KETONESUR, PROTEINUR, UROBILINOGEN, NITRITE, LEUKOCYTESUR    Imaging Results:    Dg Chest 2 View  Result Date: 04/18/2018 CLINICAL DATA:  Initial evaluation for acute shortness of breath for 1 month. EXAM: CHEST - 2 VIEW COMPARISON:  Prior radiograph from 03/30/2017 FINDINGS: Cardiac and mediastinal silhouettes are stable in size and contour, and remain within normal limits. Aortic atherosclerosis. Lungs are hyperinflated with underlying emphysema. Pleuroparenchymal scarring noted at the bilateral lung apices and right perihilar region. No focal infiltrates. No pulmonary edema or pleural effusion. Negative for pneumothorax. Exaggeration of the normal thoracic kyphosis. Osteopenia. No acute osseous abnormality. IMPRESSION: 1. Emphysema with pleuroparenchymal pulmonary scarring, similar to  previous. 2. No other active cardiopulmonary disease. 3. Aortic atherosclerosis. Electronically Signed   By: Rise Mu M.D.   On: 04/18/2018 19:15    My personal review of EKG: Rhythm NSR   Assessment & Plan:    Active Problems:   HTN (hypertension)   Chest pain   1. Shortness of breath/chest pressure-unclear etiology, patient does have history of sinus node dysfunction.?  Symptomatic bradycardia.  Will monitor patient on telemetry, cycle cardiac enzymes troponin every 6 hours x3. 2. Mild AKI on CKD stage III-patient is on spironolactone at home.  Will monitor patient at this time.  BNP is 184.  Follow BMP in am. 3. Bradycardia-patient has a history of symptomatic bradycardia, will cut down the dose of atenolol to 12.5 mg p.o. daily.  Consult cardiology in a.m. for further recommendations. 4. Hypertension-blood pressure stable, continue atenolol, Spironolactone 5. COPD-stable, no wheezing.  No acute exacerbation.  Continue albuterol as needed   DVT Prophylaxis-   Lovenox   AM Labs Ordered, also please review Full Orders  Family Communication: Admission,  patients condition and plan of care including tests being ordered have been discussed with the patient  who indicate understanding and agree with the plan and Code Status.  Code Status: Full code  Admission status: Observation  Time spent in minutes : 60 minutes   Meredeth Ide M.D on 04/18/2018 at 10:15 PM  Between 7am to 7pm - Pager - [phone]. After 7pm go to [email or web address] - password [facility]  [facility] - Office  [phone]

## 2018-04-18 NOTE — Telephone Encounter (Signed)
Patient reports her SOB has increased a lot and she has intermittent bluured vision.I [facility], she agrees and will have someone bring her. I will FYI Dr.Taylor

## 2018-04-18 NOTE — ED Triage Notes (Signed)
Patient complaining of shortness of breath x 1 month but worsened today. States she called Dr Ladona Ridgelaylor and he advised to go to ER. Denies chest pain. States Dr Ladona Ridgelaylor has recommended pacemaker for patient.

## 2018-04-19 ENCOUNTER — Observation Stay (HOSPITAL_COMMUNITY): Payer: Medicare Other

## 2018-04-19 ENCOUNTER — Observation Stay (HOSPITAL_BASED_OUTPATIENT_CLINIC_OR_DEPARTMENT_OTHER): Payer: Medicare Other

## 2018-04-19 ENCOUNTER — Encounter (HOSPITAL_COMMUNITY): Payer: Self-pay | Admitting: Physician Assistant

## 2018-04-19 DIAGNOSIS — R079 Chest pain, unspecified: Secondary | ICD-10-CM

## 2018-04-19 DIAGNOSIS — R0609 Other forms of dyspnea: Secondary | ICD-10-CM | POA: Diagnosis not present

## 2018-04-19 DIAGNOSIS — R252 Cramp and spasm: Secondary | ICD-10-CM | POA: Diagnosis not present

## 2018-04-19 DIAGNOSIS — R001 Bradycardia, unspecified: Secondary | ICD-10-CM

## 2018-04-19 DIAGNOSIS — R0789 Other chest pain: Secondary | ICD-10-CM

## 2018-04-19 DIAGNOSIS — I340 Nonrheumatic mitral (valve) insufficiency: Secondary | ICD-10-CM | POA: Diagnosis not present

## 2018-04-19 DIAGNOSIS — I361 Nonrheumatic tricuspid (valve) insufficiency: Secondary | ICD-10-CM | POA: Diagnosis not present

## 2018-04-19 DIAGNOSIS — I10 Essential (primary) hypertension: Secondary | ICD-10-CM | POA: Diagnosis not present

## 2018-04-19 DIAGNOSIS — I493 Ventricular premature depolarization: Secondary | ICD-10-CM

## 2018-04-19 LAB — COMPREHENSIVE METABOLIC PANEL
ALBUMIN: 3.6 g/dL (ref 3.5–5.0)
ALT: 14 U/L (ref 14–54)
ANION GAP: 7 (ref 5–15)
AST: 23 U/L (ref 15–41)
Alkaline Phosphatase: 51 U/L (ref 38–126)
BILIRUBIN TOTAL: 0.8 mg/dL (ref 0.3–1.2)
BUN: 21 mg/dL — ABNORMAL HIGH (ref 6–20)
CO2: 27 mmol/L (ref 22–32)
Calcium: 9 mg/dL (ref 8.9–10.3)
Chloride: 103 mmol/L (ref 101–111)
Creatinine, Ser: 1.05 mg/dL — ABNORMAL HIGH (ref 0.44–1.00)
GFR calc non Af Amer: 48 mL/min — ABNORMAL LOW (ref >60)
GFR, EST AFRICAN AMERICAN: 56 mL/min — AB (ref >60)
GLUCOSE: 85 mg/dL (ref 65–99)
Potassium: 3.8 mmol/L (ref 3.5–5.1)
SODIUM: 137 mmol/L (ref 135–145)
TOTAL PROTEIN: 7.2 g/dL (ref 6.5–8.1)

## 2018-04-19 LAB — ECHOCARDIOGRAM COMPLETE
HEIGHTINCHES: 62.5 in
Weight: 2176.38 oz

## 2018-04-19 LAB — CBC
HCT: 39.2 % (ref 36.0–46.0)
Hemoglobin: 12.9 g/dL (ref 12.0–15.0)
MCH: 33 pg (ref 26.0–34.0)
MCHC: 32.9 g/dL (ref 30.0–36.0)
MCV: 100.3 fL — ABNORMAL HIGH (ref 78.0–100.0)
Platelets: 176 10*3/uL (ref 150–400)
RBC: 3.91 MIL/uL (ref 3.87–5.11)
RDW: 14 % (ref 11.5–15.5)
WBC: 7.7 10*3/uL (ref 4.0–10.5)

## 2018-04-19 LAB — TROPONIN I

## 2018-04-19 LAB — D-DIMER, QUANTITATIVE (NOT AT ARMC): D DIMER QUANT: 1.06 ug{FEU}/mL — AB (ref 0.00–0.50)

## 2018-04-19 MED ORDER — MAGNESIUM OXIDE 400 (241.3 MG) MG PO TABS
400.0000 mg | ORAL_TABLET | Freq: Every day | ORAL | Status: DC
  Administered 2018-04-19 – 2018-04-20 (×2): 400 mg via ORAL
  Filled 2018-04-19: qty 1

## 2018-04-19 MED ORDER — IOPAMIDOL (ISOVUE-370) INJECTION 76%
75.0000 mL | Freq: Once | INTRAVENOUS | Status: AC | PRN
  Administered 2018-04-19: 75 mL via INTRAVENOUS

## 2018-04-19 NOTE — Progress Notes (Signed)
*  PRELIMINARY RESULTS* Echocardiogram 2D Echocardiogram has been performed by Jeryl ColumbiaJohanna Elliott, RDCS.  Stacey DrainWhite, Gracyn Santillanes J 04/19/2018, 4:26 PM

## 2018-04-19 NOTE — Progress Notes (Signed)
SATURATION QUALIFICATIONS: (This note is used to comply with regulatory documentation for home oxygen)  Patient Saturations on Room Air at Rest = 98%  Patient Saturations on Room Air while Ambulating = 97%  Patient Saturations on 2 Liters of oxygen while Ambulating = N/A  Please briefly explain why patient needs home oxygen: patient did not require oxygen

## 2018-04-19 NOTE — Consult Note (Addendum)
Cardiology Consultation:   Patient ID: Rhonda Spears; [id number]; 09/03/1934   Admit date: 04/18/2018 Date of Consult: 04/19/2018  Primary Care Provider: Kela MillinBarrino, Alethea Y, MD Primary Cardiologist: none Primary Electrophysiologist: Dr. Ladona Ridgelaylor, 10/01/2017   Patient Profile:   Rhonda Spears is a 84 y.o. female with a hx of HTN, sinus node dysfunction, PVCs , who is being seen today for the evaluation of chest pressure and shortness of breath as well as bradycardia at the request of Dr. Sharl MaLama.  History of Present Illness:   Rhonda Spears was seen 03/31/2017 by Dr. Ladona Ridgelaylor for chest pain and shortness of breath.  An echo at that time showed a normal EF with grade 2 diastolic dysfunction and high LV filling pressures.  Stress test showed normal perfusion.  Heart rate was in the 30s at times, atenolol dose was decreased from 25 mg twice daily to 25 mg daily.  09/2017 office visit, heart rate was 44 but she was asymptomatic, continue atenolol 25 mg daily to help with PVCs.  Pt reports increased DOE for about 4 months, gradually getting worse. She was formerly able to walk for 10-15 minutes, doing this regularly. Gradually, the DOE got worse, she has been having trouble going to the mailbox>>trouble walking in the house>>trouble getting across the room.   Yesterday, she was unable to do hardly anything without getting SOB and was having chest pressure. The chest pressure happens when she exerts herself and gets SOB. It has been going on for a while, but this was worse. 8/10 yesterday. Sx are relieved by rest in 5-10 minutes.   The CP has been consistent with exertion and she has woken with it a time or 2.   Yesterday, she just could not get her breath>>ER and admitted. Her CP resolved without intervention. She is currently pain-free.    Past Medical History:  Diagnosis Date  . Chest pain with moderate risk for cardiac etiology 03/30/2017  . Gout   . Hypertension   . PVC's (premature  ventricular contractions) 03/2017  . Renal disorder     Past Surgical History:  Procedure Laterality Date  . arthroscopic left knee    . repair left femoral neck    . right medial men       Prior to Admission medications   Medication Sig Start Date End Date Taking? Authorizing Provider  allopurinol (ZYLOPRIM) 100 MG tablet Take 50 mg by mouth at bedtime.    Yes [provider]  amitriptyline (ELAVIL) 10 MG tablet Take 5 mg by mouth at bedtime.    Yes [provider]  aspirin EC 81 MG tablet Take 81 mg by mouth 3 (three) times a week.    Yes [provider]  atenolol (TENORMIN) 25 MG tablet Take 1 tablet (25 mg total) by mouth daily. Patient taking differently: Take 12.5 mg by mouth every morning.  04/01/17  Yes Mikhail, Jean LafitteMaryann, DO  Bee Pollen 550 MG CAPS Take 1 capsule by mouth every morning.    Yes [provider]  Calcium Carb-Cholecalciferol (CALCIUM 600+D) 600-800 MG-UNIT TABS Take 1 tablet by mouth every morning.    Yes [provider]  cholecalciferol (VITAMIN D) 1000 units tablet Take 1,000 Units by mouth every morning.    Yes [provider]  docusate sodium (COLACE) 100 MG capsule Take 100 mg by mouth daily as needed for mild constipation or moderate constipation.    Yes [provider]  folic acid (FOLVITE) 400 MCG tablet Take 400  mcg by mouth at bedtime.   Yes [provider]  HYDROcodone-acetaminophen (NORCO/VICODIN) 5-325 MG tablet Take 1 tablet by mouth every 6 (six) hours as needed for moderate pain.   Yes [provider]  Magnesium 250 MG TABS Take 1 tablet by mouth daily.   Yes [provider]  Multiple Vitamin (MULTIVITAMIN) capsule Take 1 capsule by mouth daily.   Yes [provider]  spironolactone (ALDACTONE) 25 MG tablet Take 25 mg by mouth 2 (two) times daily.    Yes [provider]    Inpatient Medications: Scheduled Meds: . allopurinol  100 mg Oral QHS  .  amitriptyline  5 mg Oral QHS  . aspirin EC  81 mg Oral Daily  . atenolol  12.5 mg Oral Daily  . enoxaparin (LOVENOX) injection  30 mg Subcutaneous Q24H  . folic acid  500 mcg Oral QHS  . magnesium oxide  400 mg Oral Daily   Continuous Infusions: . sodium chloride 10 mL/hr at 04/19/18 0540   PRN Meds: acetaminophen **OR** acetaminophen, albuterol, ondansetron **OR** ondansetron (ZOFRAN) IV  Allergies:    Allergies  Allergen Reactions  . Latex Itching  . Omeprazole Palpitations  . Tramadol Itching and Nausea Only  . Sulfa Antibiotics Nausea Only and Other (See Comments)    Severe headaches  . Zanaflex [Tizanidine Hcl] Itching and Other (See Comments)    oversedation    Social History:   Social History   Socioeconomic History  . Marital status: Divorced    Spouse name: Not on file  . Number of children: Not on file  . Years of education: Not on file  . Highest education level: Not on file  Occupational History  . Occupation: Retired  Engineer, production  . Financial resource strain: Not on file  . Food insecurity:    Worry: Not on file    Inability: Not on file  . Transportation needs:    Medical: Not on file    Non-medical: Not on file  Tobacco Use  . Smoking status: Never Smoker  . Smokeless tobacco: Never Used  Substance and Sexual Activity  . Alcohol use: No  . Drug use: No  . Sexual activity: Not on file  Lifestyle  . Physical activity:    Days per week: Not on file    Minutes per session: Not on file  . Stress: Not on file  Relationships  . Social connections:    Talks on phone: Not on file    Gets together: Not on file    Attends religious service: Not on file    Active member of club or organization: Not on file    Attends meetings of clubs or organizations: Not on file    Relationship status: Not on file  . Intimate partner violence:    Fear of current or ex partner: Not on file    Emotionally abused: Not on file    Physically abused: Not on file     Forced sexual activity: Not on file  Other Topics Concern  . Not on file  Social History Narrative   Pt lives alone in [place], [place]    Family History:   Family History  Problem Relation Age of Onset  . Heart attack Mother   . Hypertension Mother   . Early death Father 41       Car accident   Family Status:  Family Status  Relation Name Status  . Mother  Deceased  . Father  Deceased  ROS:  Please see the history of present illness.  All other ROS reviewed and negative.     Physical Exam/Data:   Vitals:   04/18/18 2230 04/18/18 2300 04/19/18 0010 04/19/18 0607  BP: 105/61 (!) 103/58 106/66 108/66  Pulse: (!) 56 (!) 31 66 (!) 53  Resp: 16 (!) 22 16 16   Temp:   98.1 F (36.7 C) (!) 97.4 F (36.3 C)  TempSrc:   Oral Oral  SpO2: 96% 94% 96% 97%  Weight:   136 lb 0.4 oz (61.7 kg)   Height:   5' 2.5" (1.588 m)     Intake/Output Summary (Last 24 hours) at 04/19/2018 1007 Last data filed at 04/19/2018 0623 Gross per 24 hour  Intake 275.16 ml  Output -  Net 275.16 ml   Filed Weights   04/18/18 1838 04/19/18 0010  Weight: 137 lb (62.1 kg) 136 lb 0.4 oz (61.7 kg)   Body mass index is 24.48 kg/m.  General:  Well nourished, well developed, in no acute distress HEENT: normal Lymph: no adenopathy Neck: no JVD Vascular: No carotid bruits; 4/4 extremity pulses 2+, without bruits  Cardiac:  normal S1, S2; RRR; no murmur Lungs:  clear to auscultation bilaterally, no wheezing, rhonchi or rales  Abd: soft, nontender, no hepatomegaly  Ext: no edema Musculoskeletal:  No deformities, BUE and BLE strength normal and equal Skin: warm and dry  Neuro:  CNs 2-12 intact, no focal abnormalities noted Psych:  Normal affect   EKG:  The EKG was personally reviewed and demonstrates:  SR, HR 67,  Telemetry:  Telemetry was personally reviewed and demonstrates:  SR , PVCs  Relevant CV Studies:  ECHO: 04/02/2017 - Left ventricle: The cavity size was normal. There was mild focal    basal hypertrophy of the septum. Systolic function was normal.   The estimated ejection fraction was in the range of 55% to 60%.   Wall motion was normal; there were no regional wall motion   abnormalities. Features are consistent with a pseudonormal left   ventricular filling pattern, with concomitant abnormal relaxation   and increased filling pressure (grade 2 diastolic dysfunction).   Doppler parameters are consistent with high ventricular filling   pressure.  MYOVIEW: 04/03/2017  There was no ST segment deviation noted during stress.  The study is normal.    No perfusion defects at rest or during stress  This is a low risk study.    Normal resting and stress perfusion. No ischemia or infarction EF Not gated due to frequent PVC;s    Laboratory Data:  Chemistry Recent Labs  Lab 04/18/18 1853 04/19/18 0609  NA 135 137  K 4.1 3.8  CL 101 103  CO2 23 27  GLUCOSE 110* 85  BUN 24* 21*  CREATININE 1.35* 1.05*  CALCIUM 9.7 9.0  GFRNONAA 36* 48*  GFRAA 41* 56*  ANIONGAP 11 7    Lab Results  Component Value Date   ALT 14 04/19/2018   AST 23 04/19/2018   ALKPHOS 51 04/19/2018   BILITOT 0.8 04/19/2018   Hematology Recent Labs  Lab 04/18/18 1853 04/19/18 0609  WBC 9.5 7.7  RBC 4.25 3.91  HGB 14.3 12.9  HCT 42.4 39.2  MCV 99.8 100.3*  MCH 33.6 33.0  MCHC 33.7 32.9  RDW 14.0 14.0  PLT 185 176   Cardiac Enzymes Recent Labs  Lab 04/18/18 1853 04/19/18 0020 04/19/18 0609  TROPONINI <0.03 <0.03 <0.03     BNP Recent Labs  Lab 04/18/18 1853  BNP 184.0*    TSH:  Lab Results  Component Value Date   TSH 2.418 03/31/2017   Lipids:No results found for: CHOL, HDL, LDLCALC, LDLDIRECT, TRIG, CHOLHDL HgbA1c:No results found for: HGBA1C Magnesium:  Magnesium  Date Value Ref Range Status  03/30/2017 2.0 1.7 - 2.4 mg/dL Final   No results found for: DDIMER   Radiology/Studies:  Dg Chest 2 View  Result Date: 04/18/2018 CLINICAL DATA:  Initial  evaluation for acute shortness of breath for 1 month. EXAM: CHEST - 2 VIEW COMPARISON:  Prior radiograph from 03/30/2017 FINDINGS: Cardiac and mediastinal silhouettes are stable in size and contour, and remain within normal limits. Aortic atherosclerosis. Lungs are hyperinflated with underlying emphysema. Pleuroparenchymal scarring noted at the bilateral lung apices and right perihilar region. No focal infiltrates. No pulmonary edema or pleural effusion. Negative for pneumothorax. Exaggeration of the normal thoracic kyphosis. Osteopenia. No acute osseous abnormality. IMPRESSION: 1. Emphysema with pleuroparenchymal pulmonary scarring, similar to previous. 2. No other active cardiopulmonary disease. 3. Aortic atherosclerosis. Electronically Signed   By: Rise Mu M.D.   On: 04/18/2018 19:15    Assessment and Plan:   Principal Problem: 1.  DOE (dyspnea on exertion) - no CHF on CXR and no extra volume by exam.  - do not feel she is high-risk for PE.  - chronotropic incompetence could be responsible, unclear since she feels her heart pound rapidly at times and her HR seems to increase w/ movement.  - ambulate and check sats w/ ambulation  Active Problems: 2.  HTN (hypertension) - BP good control  - per IM  3.  Chest pain with moderate risk for cardiac etiology - ez neg MI and ECG w/ some changes, unclear significance - pt had breakfast, cannot do Lexiscan MV today. - feel her DOE would make her a poor candidate for treadmill test, although the DOE may be an anginal equivalent   For questions or updates, please contact [facility] Please consult [email or web address] for contact info under Cardiology/STEMI.   Signed, Theodore Demark, PA-C  04/19/2018 10:07 AM  The patient was seen and examined, and I agree with the history, physical exam, assessment and plan as documented above, with modifications as noted below. I have also personally reviewed all relevant documentation, old records,  labs, and both radiographic and cardiovascular studies. I have also independently interpreted old and new ECG's.  Recently, this is an 84 year old woman who normally follows with Dr. Ladona Ridgel and last saw him in December 2018.  She has a history of symptomatically to cardia and palpitations.  Symptoms reportedly resolved with a beta-blocker.  She also had dyspnea at that time felt to be multifactorial in etiology.  She underwent a normal nuclear stress test in June 2018 and echocardiogram at that time demonstrated normal left ventricular systolic function and regional wall motion with grade 2 diastolic dysfunction and high ventricular filling pressures.  She tells me she is gradually been experiencing aggressive exertional dyspnea over the past 3 months but yesterday it got so significant where she was unable to walk and came to the ED.  Troponins have been normal.  Conference of metabolic panel was unremarkable.  CBC showed normal hemoglobin and white blood cell count.  BNP was mildly elevated at 184.  I reviewed the chest x-ray which showed emphysema with pleuroparenchymal pulmonary scarring similar to a previous chest x-ray and aortic atherosclerosis.  She has no history of smoking but was exposed to secondhand smoke from her husband for several decades.  A  d-dimer and echocardiogram have both been ordered and are pending.  She has been complaining of bilateral ankle cramping.  Recommendations:  I agree with d-dimer and perhaps lower extremity Dopplers.  While she does have some pleural-parenchymal pulmonary scarring indicating some probability for interstitial lung disease, her symptoms are concerning as well for ischemic heart disease.  She is having frequent PVCs and sinus bradycardia on atenolol 12.5 mg daily.  She is on aspirin 81 mg daily.  If d-dimer and lower extremity Dopplers are normal, I would also consider transfer to [facility] for right and left heart catheterization and coronary  angiography.  She has eaten both breakfast and lunch today and the earliest this procedure could be performed is on Monday, June 24.   I would not repeat a stress test as she had a normal one in June 2018. I will await to see what echocardiogram shows in the interim.  Prentice Docker, MD, [facility]  04/19/2018 12:17 PM

## 2018-04-19 NOTE — Care Management Obs Status (Signed)
MEDICARE OBSERVATION STATUS NOTIFICATION   Patient Details  Name: Rhonda DadaYvonne L Felicetti MRN: [id number] Date of Birth: 11/07/1933   Medicare Observation Status Notification Given:  Yes    Malcolm MetroChildress, Jaysin Gayler Demske, RN 04/19/2018, 12:58 PM

## 2018-04-19 NOTE — Progress Notes (Signed)
PROGRESS NOTE  Rhonda Spears ZOX:[id number] DOB: [birth year] DOA: 04/18/2018 PCP: Kela Millin, MD  Brief History:  84 year old female with a history of hypertension, sinus node dysfunction, and presumptive COPD presenting with 1 month history of shortness of breath and dyspnea on exertion that was worsened on 04/18/2018.  Reviewed the medical record shows that the patient has had intermittent dyspnea on exertion and easy fatigability for a number of years.  The patient was admitted to the hospital from 03/30/2017 through 04/01/2017 for similar symptoms.  The patient follows Dr. Lewayne Bunting for sinus node dysfunction, and there is suggestion that the patient may certainly have symptomatic bradycardia.  She denies any fevers, chills, coughing, hemoptysis, nausea, vomiting, abdominal pain.  Patient has not been started any new medications.  The patient has been exposed to many years of secondhand smoke from her husband.  She has never smoked in her lifetime.  The patient has not had any recent travels.  She denies any worsening lower extremity edema, orthopnea, PND. On 04/18/2018, the patient noted worsening dyspnea on exertion with intermittent chest discomfort while performing her activities of daily living.  As result, she contacted Dr. Ladona Ridgel whom advised the patient come to the hospital for further evaluation.  Assessment/Plan: Chest discomfort/dyspnea -Etiology of dyspnea unclear -Patient may have symptomatic bradycardia -Previous chest x-rays have suggested hyperinflation and possible emphysema which may be contributing -In addition, CXRs have suggested pleural-parenchymal scarring--therefore, patient may certainly have some underlying interstitial lung disease -Cardiology consult -Check d-dimer -Echocardiogram -Personally reviewed chest x-ray--hyperinflation without edema or consolidation -If inpatient work-up is unremarkable, the patient would benefit from ultimate pulmonary  consultation, PFTs, and high-resolution CT -Troponins negative x2 -continue ASA -04/09/2017 Myoview low risk  CKD stage III -Baseline creatinine 1.0-1.3  Essential hypertension -Patient's blood pressure has been soft -Discontinue Spironolactone -Continue atenolol for her symptomatic PVCs  Lower extremity edema -likely due to venous insufficiency -venous duplex    Disposition Plan:   Home if cleared by cardiology Family Communication:   Family at bedside--Total time spent 35 minutes.  Greater than 50% spent face to face counseling and coordinating care.   Consultants:  cardiology  Code Status:  FULL  DVT Prophylaxis:  [place] Lovenox   Procedures: As Listed in Progress Note Above  Antibiotics: None    Subjective: Patient denies fevers, chills, headache, chest pain, dyspnea, nausea, vomiting, diarrhea, abdominal pain, dysuria, hematuria, hematochezia, and melena.   Objective: Vitals:   04/18/18 2230 04/18/18 2300 04/19/18 0010 04/19/18 0607  BP: 105/61 (!) 103/58 106/66 108/66  Pulse: (!) 56 (!) 31 66 (!) 53  Resp: 16 (!) 22 16 16   Temp:   98.1 F (36.7 C) (!) 97.4 F (36.3 C)  TempSrc:   Oral Oral  SpO2: 96% 94% 96% 97%  Weight:   61.7 kg (136 lb 0.4 oz)   Height:   5' 2.5" (1.588 m)     Intake/Output Summary (Last 24 hours) at 04/19/2018 0719 Last data filed at 04/19/2018 8119 Gross per 24 hour  Intake 275.16 ml  Output -  Net 275.16 ml   Weight change:  Exam:   General:  Pt is alert, follows commands appropriately, not in acute distress  HEENT: No icterus, No thrush, No neck mass, [place]/AT  Cardiovascular: RRR, S1/S2, no rubs, no gallops  Respiratory: CTA bilaterally, no wheezing, no crackles, no rhonchi  Abdomen: Soft/+BS, non tender, non distended, no guarding  Extremities: No edema, No  lymphangitis, No petechiae, No rashes, no synovitis   Data Reviewed: I have personally reviewed following labs and imaging studies Basic Metabolic  Panel: Recent Labs  Lab 04/18/18 1853  NA 135  K 4.1  CL 101  CO2 23  GLUCOSE 110*  BUN 24*  CREATININE 1.35*  CALCIUM 9.7   Liver Function Tests: No results for input(s): AST, ALT, ALKPHOS, BILITOT, PROT, ALBUMIN in the last 168 hours. No results for input(s): LIPASE, AMYLASE in the last 168 hours. No results for input(s): AMMONIA in the last 168 hours. Coagulation Profile: No results for input(s): INR, PROTIME in the last 168 hours. CBC: Recent Labs  Lab 04/18/18 1853  WBC 9.5  NEUTROABS 5.9  HGB 14.3  HCT 42.4  MCV 99.8  PLT 185   Cardiac Enzymes: Recent Labs  Lab 04/18/18 1853 04/19/18 0020  TROPONINI <0.03 <0.03   BNP: Invalid input(s): POCBNP CBG: No results for input(s): GLUCAP in the last 168 hours. HbA1C: No results for input(s): HGBA1C in the last 72 hours. Urine analysis: No results found for: COLORURINE, APPEARANCEUR, LABSPEC, PHURINE, GLUCOSEU, HGBUR, BILIRUBINUR, KETONESUR, PROTEINUR, UROBILINOGEN, NITRITE, LEUKOCYTESUR Sepsis Labs: @LABRCNTIP (procalcitonin:4,lacticidven:4) )No results found for this or any previous visit (from the past 240 hour(s)).   Scheduled Meds: . allopurinol  100 mg Oral QHS  . amitriptyline  5 mg Oral QHS  . aspirin EC  81 mg Oral Daily  . atenolol  12.5 mg Oral Daily  . enoxaparin (LOVENOX) injection  30 mg Subcutaneous Q24H  . folic acid  500 mcg Oral QHS  . Magnesium  1 tablet Oral Daily  . spironolactone  25 mg Oral Daily   Continuous Infusions: . sodium chloride 10 mL/hr at 04/19/18 0540    Procedures/Studies: Dg Chest 2 View  Result Date: 04/18/2018 CLINICAL DATA:  Initial evaluation for acute shortness of breath for 1 month. EXAM: CHEST - 2 VIEW COMPARISON:  Prior radiograph from 03/30/2017 FINDINGS: Cardiac and mediastinal silhouettes are stable in size and contour, and remain within normal limits. Aortic atherosclerosis. Lungs are hyperinflated with underlying emphysema. Pleuroparenchymal scarring noted  at the bilateral lung apices and right perihilar region. No focal infiltrates. No pulmonary edema or pleural effusion. Negative for pneumothorax. Exaggeration of the normal thoracic kyphosis. Osteopenia. No acute osseous abnormality. IMPRESSION: 1. Emphysema with pleuroparenchymal pulmonary scarring, similar to previous. 2. No other active cardiopulmonary disease. 3. Aortic atherosclerosis. Electronically Signed   By: Rise MuBenjamin  McClintock M.D.   On: 04/18/2018 19:15    Catarina Hartshornavid Denaja Verhoeven, DO  [facility] Pager [phone]  If 7PM-7AM, please contact night-coverage [email or web address] Password [email or web address] 04/19/2018, 7:19 AM   LOS: 0 days

## 2018-04-20 DIAGNOSIS — I10 Essential (primary) hypertension: Secondary | ICD-10-CM | POA: Diagnosis not present

## 2018-04-20 DIAGNOSIS — N183 Chronic kidney disease, stage 3 unspecified: Secondary | ICD-10-CM

## 2018-04-20 DIAGNOSIS — R079 Chest pain, unspecified: Secondary | ICD-10-CM | POA: Diagnosis not present

## 2018-04-20 DIAGNOSIS — R0609 Other forms of dyspnea: Secondary | ICD-10-CM | POA: Diagnosis not present

## 2018-04-20 NOTE — Discharge Summary (Signed)
Physician Discharge Summary  Rhonda Spears:[id number] DOB: 12-13-1933 DOA: 04/18/2018  PCP: Kela Millin, MD  Admit date: 04/18/2018 Discharge date: 04/20/2018  Admitted From:  Home Disposition:  Home   Recommendations for Outpatient Follow-up:  1. Follow up with PCP in 1-2 weeks 2. Please obtain BMP/CBC in one week 3. Please follow up with Dr. Purvis Sheffield or cardiology APP within 3-5 days  Discharge Condition: Stable CODE STATUS: FULL Diet recommendation: Heart Healthy   Brief/Interim Summary: 84 year old female with a history of hypertension, sinus node dysfunction, and presumptive COPD presenting with 1 month history of shortness of breath and dyspnea on exertion that was worsened on 04/18/2018.  Reviewed the medical record shows that the patient has had intermittent dyspnea on exertion and easy fatigability for a number of years.  The patient was admitted to the hospital from 03/30/2017 through 04/01/2017 for similar symptoms.  The patient follows Dr. Lewayne Bunting for sinus node dysfunction, and there is suggestion that the patient may certainly have symptomatic bradycardia.  She denies any fevers, chills, coughing, hemoptysis, nausea, vomiting, abdominal pain.  Patient has not been started any new medications.  The patient has been exposed to many years of secondhand smoke from her husband.  She has never smoked in her lifetime.  The patient has not had any recent travels.  She denies any worsening lower extremity edema, orthopnea, PND. On 04/18/2018, the patient noted worsening dyspnea on exertion with intermittent chest discomfort while performing her activities of daily living.  As result, she contacted Dr. Ladona Ridgel whom advised the patient come to the hospital for further evaluation.    Discharge Diagnoses:  Chest discomfort/dyspnea -Patient may have symptomatic bradycardia -no chronotropic incompetence with ambulation--HR increased to 70s -Previous chest x-rays have  suggested hyperinflation and possible emphysema/ILD which may be contributing -In addition, CXRs have suggested pleural-parenchymal scarring--therefore, patient may certainly have some underlying interstitial lung disease -Cardiology consult appreciated -Check d-dimer--1.06 -Echocardiogram--EF 60-65%, no WMA, grade 2 DD, mild TR -Personally reviewed chest x-ray--hyperinflation without edema or consolidation -If inpatient work-up is unremarkable, the patient would benefit from ultimate pulmonary consultation, PFTs, and high-resolution CT -Troponins negative x3 -continue ASA -CTA chest--NO PE, minimal bronchiectasis RUL, scattered subpleural interstitial thickening, bilateral biapical scarring-->suggest possilbe ILD -04/09/2017 Myoview low risk -case discussed with Dr. Ellin Goodie walked around nursing unit 300 with minimal to no dyspnea with no chest discomfort-->pt stated "I'm breathing better than I did 1-2 months ago" after the walk -instructed pt to call cardiology to follow up this week  CKD stage III -Baseline creatinine 1.0-1.3  Essential hypertension -Patient's blood pressure has been soft -Discontinue Spironolactone -Continue atenolol for her symptomatic PVCs  Lower extremity edema -likely due to venous insufficiency -venous duplex--neg      Discharge Instructions   Allergies as of 04/20/2018      Reactions   Latex Itching   Omeprazole Palpitations   Tramadol Itching, Nausea Only   Sulfa Antibiotics Nausea Only, Other (See Comments)   Severe headaches   Zanaflex [tizanidine Hcl] Itching, Other (See Comments)   oversedation      Medication List    STOP taking these medications   spironolactone 25 MG tablet Commonly known as:  ALDACTONE     TAKE these medications   allopurinol 100 MG tablet Commonly known as:  ZYLOPRIM Take 50 mg by mouth at bedtime.   amitriptyline 10 MG tablet Commonly known as:  ELAVIL Take 5 mg by mouth at bedtime.   aspirin  EC 81 MG  tablet Take 81 mg by mouth 3 (three) times a week.   atenolol 25 MG tablet Commonly known as:  TENORMIN Take 1 tablet (25 mg total) by mouth daily. What changed:    how much to take  when to take this   Bee Pollen 550 MG Caps Take 1 capsule by mouth every morning.   CALCIUM 600+D 600-800 MG-UNIT Tabs Generic drug:  Calcium Carb-Cholecalciferol Take 1 tablet by mouth every morning.   cholecalciferol 1000 units tablet Commonly known as:  VITAMIN D Take 1,000 Units by mouth every morning.   docusate sodium 100 MG capsule Commonly known as:  COLACE Take 100 mg by mouth daily as needed for mild constipation or moderate constipation.   folic acid 400 MCG tablet Commonly known as:  FOLVITE Take 400 mcg by mouth at bedtime.   HYDROcodone-acetaminophen 5-325 MG tablet Commonly known as:  NORCO/VICODIN Take 1 tablet by mouth every 6 (six) hours as needed for moderate pain.   Magnesium 250 MG Tabs Take 1 tablet by mouth daily.   multivitamin capsule Take 1 capsule by mouth daily.      Follow-up Information    Laqueta Linden, MD Follow up in 3 day(s).   Specialty:  Cardiology Contact information: [address] [place] [address] [phone]          Allergies  Allergen Reactions  . Latex Itching  . Omeprazole Palpitations  . Tramadol Itching and Nausea Only  . Sulfa Antibiotics Nausea Only and Other (See Comments)    Severe headaches  . Zanaflex [Tizanidine Hcl] Itching and Other (See Comments)    oversedation    Consultations:  cardiology   Procedures/Studies: Dg Chest 2 View  Result Date: 04/18/2018 CLINICAL DATA:  Initial evaluation for acute shortness of breath for 1 month. EXAM: CHEST - 2 VIEW COMPARISON:  Prior radiograph from 03/30/2017 FINDINGS: Cardiac and mediastinal silhouettes are stable in size and contour, and remain within normal limits. Aortic atherosclerosis. Lungs are hyperinflated with underlying emphysema.  Pleuroparenchymal scarring noted at the bilateral lung apices and right perihilar region. No focal infiltrates. No pulmonary edema or pleural effusion. Negative for pneumothorax. Exaggeration of the normal thoracic kyphosis. Osteopenia. No acute osseous abnormality. IMPRESSION: 1. Emphysema with pleuroparenchymal pulmonary scarring, similar to previous. 2. No other active cardiopulmonary disease. 3. Aortic atherosclerosis. Electronically Signed   By: Rise Mu M.D.   On: 04/18/2018 19:15   Ct Angio Chest Pe W Or Wo Contrast  Result Date: 04/19/2018 CLINICAL DATA:  Shortness of breath and dyspnea on exertion for 1 month worsened since 04/18/2018, history hypertension, sinus node dysfunction, presumptive COPD EXAM: CT ANGIOGRAPHY CHEST WITH CONTRAST TECHNIQUE: Multidetector CT imaging of the chest was performed using the standard protocol during bolus administration of intravenous contrast. Multiplanar CT image reconstructions and MIPs were obtained to evaluate the vascular anatomy. CONTRAST:  75mL ISOVUE-370 IOPAMIDOL (ISOVUE-370) INJECTION 76% IV COMPARISON:  06/02/2007 FINDINGS: Cardiovascular: Atherosclerotic calcifications aorta, coronary arteries and proximal great vessels. Cardiac chambers enlarged. No pericardial effusion. Pulmonary arteries adequately opacified and patent. No evidence of pulmonary embolism. Mediastinum/Nodes: Small hiatal hernia. Esophagus unremarkable. Base of cervical region normal appearance. No thoracic adenopathy. Lungs/Pleura: Emphysematous and bronchitic changes. Scattered subpleural interstitial thickening throughout both lungs greatest in upper lobes, chronic. Biapical scarring. Few scattered nodular foci in both lungs, largest 5 mm diameter LEFT lower lobe image 79. No segmental infiltrate, pleural effusion or pneumothorax. Minimal bronchiectasis in medial RIGHT upper lobe. Upper Abdomen: Visualized upper abdomen unremarkable. Musculoskeletal:  Mild thoracolumbar  scoliosis and diffuse osseous demineralization with scattered degenerative disc disease changes. Review of the MIP images confirms the above findings. IMPRESSION: No evidence of pulmonary embolism. COPD changes with scattered areas of scarring. Scattered atherosclerotic calcifications including coronary arteries. Nodular foci up to 5 mm diameter, recommendation below. No follow-up needed if patient is low-risk (and has no known or suspected primary neoplasm). Non-contrast chest CT can be considered in 12 months if patient is high-risk. This recommendation follows the consensus statement: Guidelines for Management of Incidental Pulmonary Nodules Detected on CT Images: From the Fleischner Society 2017; Radiology 2017; 284:228-243. Aortic Atherosclerosis (ICD10-I70.0) and Emphysema (ICD10-J43.9). Electronically Signed   By: Ulyses SouthwardMark  Boles M.D.   On: 04/19/2018 16:54   [place] Venous Img Lower Bilateral  Result Date: 04/19/2018 CLINICAL DATA:  Bilateral lower extremity pain and edema for the past month. Shortness of breath. Evaluate for DVT. EXAM: BILATERAL LOWER EXTREMITY VENOUS DOPPLER ULTRASOUND TECHNIQUE: Gray-scale sonography with graded compression, as well as color Doppler and duplex ultrasound were performed to evaluate the lower extremity deep venous systems from the level of the common femoral vein and including the common femoral, femoral, profunda femoral, popliteal and calf veins including the posterior tibial, peroneal and gastrocnemius veins when visible. The superficial great saphenous vein was also interrogated. Spectral Doppler was utilized to evaluate flow at rest and with distal augmentation maneuvers in the common femoral, femoral and popliteal veins. COMPARISON:  None. FINDINGS: RIGHT LOWER EXTREMITY Common Femoral Vein: No evidence of thrombus. Normal compressibility, respiratory phasicity and response to augmentation. Saphenofemoral Junction: No evidence of thrombus. Normal compressibility and flow  on color Doppler imaging. Profunda Femoral Vein: No evidence of thrombus. Normal compressibility and flow on color Doppler imaging. Femoral Vein: No evidence of thrombus. Normal compressibility, respiratory phasicity and response to augmentation. Popliteal Vein: No evidence of thrombus. Normal compressibility, respiratory phasicity and response to augmentation. Calf Veins: No evidence of thrombus. Normal compressibility and flow on color Doppler imaging. Superficial Great Saphenous Vein: No evidence of thrombus. Normal compressibility. Venous Reflux:  None. Other Findings:  None. LEFT LOWER EXTREMITY Common Femoral Vein: No evidence of thrombus. Normal compressibility, respiratory phasicity and response to augmentation. Saphenofemoral Junction: No evidence of thrombus. Normal compressibility and flow on color Doppler imaging. Profunda Femoral Vein: No evidence of thrombus. Normal compressibility and flow on color Doppler imaging. Femoral Vein: No evidence of thrombus. Normal compressibility, respiratory phasicity and response to augmentation. Popliteal Vein: No evidence of thrombus. Normal compressibility, respiratory phasicity and response to augmentation. Calf Veins: No evidence of thrombus. Normal compressibility and flow on color Doppler imaging. Superficial Great Saphenous Vein: No evidence of thrombus. Normal compressibility. Venous Reflux:  None. Other Findings:  None. IMPRESSION: No evidence of DVT within either lower extremity. Electronically Signed   By: Simonne ComeJohn  Watts M.D.   On: 04/19/2018 17:38        Discharge Exam: Vitals:   04/19/18 2306 04/20/18 0628  BP: (!) 105/58 105/77  Pulse: (!) 32 63  Resp: 20 16  Temp: 98.3 F (36.8 C) 97.6 F (36.4 C)  SpO2: 94% 96%   Vitals:   04/19/18 0607 04/19/18 1300 04/19/18 2306 04/20/18 0628  BP: 108/66 106/69 (!) 105/58 105/77  Pulse: (!) 53 66 (!) 32 63  Resp: 16 18 20 16   Temp: (!) 97.4 F (36.3 C) 97.6 F (36.4 C) 98.3 F (36.8 C) 97.6 F  (36.4 C)  TempSrc: Oral Oral Oral Oral  SpO2: 97% 97% 94% 96%  Weight:  Height:        General: Pt is alert, awake, not in acute distress Cardiovascular: RRR, S1/S2 +, no rubs, no gallops Respiratory: fine bibasilar crackles, no wheeze Abdominal: Soft, NT, ND, bowel sounds + Extremities: trace LE edema, no cyanosis   The results of significant diagnostics from this hospitalization (including imaging, microbiology, ancillary and laboratory) are listed below for reference.    Significant Diagnostic Studies: Dg Chest 2 View  Result Date: 04/18/2018 CLINICAL DATA:  Initial evaluation for acute shortness of breath for 1 month. EXAM: CHEST - 2 VIEW COMPARISON:  Prior radiograph from 03/30/2017 FINDINGS: Cardiac and mediastinal silhouettes are stable in size and contour, and remain within normal limits. Aortic atherosclerosis. Lungs are hyperinflated with underlying emphysema. Pleuroparenchymal scarring noted at the bilateral lung apices and right perihilar region. No focal infiltrates. No pulmonary edema or pleural effusion. Negative for pneumothorax. Exaggeration of the normal thoracic kyphosis. Osteopenia. No acute osseous abnormality. IMPRESSION: 1. Emphysema with pleuroparenchymal pulmonary scarring, similar to previous. 2. No other active cardiopulmonary disease. 3. Aortic atherosclerosis. Electronically Signed   By: Rise Mu M.D.   On: 04/18/2018 19:15   Ct Angio Chest Pe W Or Wo Contrast  Result Date: 04/19/2018 CLINICAL DATA:  Shortness of breath and dyspnea on exertion for 1 month worsened since 04/18/2018, history hypertension, sinus node dysfunction, presumptive COPD EXAM: CT ANGIOGRAPHY CHEST WITH CONTRAST TECHNIQUE: Multidetector CT imaging of the chest was performed using the standard protocol during bolus administration of intravenous contrast. Multiplanar CT image reconstructions and MIPs were obtained to evaluate the vascular anatomy. CONTRAST:  75mL ISOVUE-370  IOPAMIDOL (ISOVUE-370) INJECTION 76% IV COMPARISON:  06/02/2007 FINDINGS: Cardiovascular: Atherosclerotic calcifications aorta, coronary arteries and proximal great vessels. Cardiac chambers enlarged. No pericardial effusion. Pulmonary arteries adequately opacified and patent. No evidence of pulmonary embolism. Mediastinum/Nodes: Small hiatal hernia. Esophagus unremarkable. Base of cervical region normal appearance. No thoracic adenopathy. Lungs/Pleura: Emphysematous and bronchitic changes. Scattered subpleural interstitial thickening throughout both lungs greatest in upper lobes, chronic. Biapical scarring. Few scattered nodular foci in both lungs, largest 5 mm diameter LEFT lower lobe image 79. No segmental infiltrate, pleural effusion or pneumothorax. Minimal bronchiectasis in medial RIGHT upper lobe. Upper Abdomen: Visualized upper abdomen unremarkable. Musculoskeletal: Mild thoracolumbar scoliosis and diffuse osseous demineralization with scattered degenerative disc disease changes. Review of the MIP images confirms the above findings. IMPRESSION: No evidence of pulmonary embolism. COPD changes with scattered areas of scarring. Scattered atherosclerotic calcifications including coronary arteries. Nodular foci up to 5 mm diameter, recommendation below. No follow-up needed if patient is low-risk (and has no known or suspected primary neoplasm). Non-contrast chest CT can be considered in 12 months if patient is high-risk. This recommendation follows the consensus statement: Guidelines for Management of Incidental Pulmonary Nodules Detected on CT Images: From the Fleischner Society 2017; Radiology 2017; 284:228-243. Aortic Atherosclerosis (ICD10-I70.0) and Emphysema (ICD10-J43.9). Electronically Signed   By: Ulyses Southward M.D.   On: 04/19/2018 16:54   US Venous Img Lower Bilateral  Result Date: 04/19/2018 CLINICAL DATA:  Bilateral lower extremity pain and edema for the past month. Shortness of breath. Evaluate  for DVT. EXAM: BILATERAL LOWER EXTREMITY VENOUS DOPPLER ULTRASOUND TECHNIQUE: Gray-scale sonography with graded compression, as well as color Doppler and duplex ultrasound were performed to evaluate the lower extremity deep venous systems from the level of the common femoral vein and including the common femoral, femoral, profunda femoral, popliteal and calf veins including the posterior tibial, peroneal and gastrocnemius veins when visible. The superficial great  saphenous vein was also interrogated. Spectral Doppler was utilized to evaluate flow at rest and with distal augmentation maneuvers in the common femoral, femoral and popliteal veins. COMPARISON:  None. FINDINGS: RIGHT LOWER EXTREMITY Common Femoral Vein: No evidence of thrombus. Normal compressibility, respiratory phasicity and response to augmentation. Saphenofemoral Junction: No evidence of thrombus. Normal compressibility and flow on color Doppler imaging. Profunda Femoral Vein: No evidence of thrombus. Normal compressibility and flow on color Doppler imaging. Femoral Vein: No evidence of thrombus. Normal compressibility, respiratory phasicity and response to augmentation. Popliteal Vein: No evidence of thrombus. Normal compressibility, respiratory phasicity and response to augmentation. Calf Veins: No evidence of thrombus. Normal compressibility and flow on color Doppler imaging. Superficial Great Saphenous Vein: No evidence of thrombus. Normal compressibility. Venous Reflux:  None. Other Findings:  None. LEFT LOWER EXTREMITY Common Femoral Vein: No evidence of thrombus. Normal compressibility, respiratory phasicity and response to augmentation. Saphenofemoral Junction: No evidence of thrombus. Normal compressibility and flow on color Doppler imaging. Profunda Femoral Vein: No evidence of thrombus. Normal compressibility and flow on color Doppler imaging. Femoral Vein: No evidence of thrombus. Normal compressibility, respiratory phasicity and response  to augmentation. Popliteal Vein: No evidence of thrombus. Normal compressibility, respiratory phasicity and response to augmentation. Calf Veins: No evidence of thrombus. Normal compressibility and flow on color Doppler imaging. Superficial Great Saphenous Vein: No evidence of thrombus. Normal compressibility. Venous Reflux:  None. Other Findings:  None. IMPRESSION: No evidence of DVT within either lower extremity. Electronically Signed   By: Simonne Come M.D.   On: 04/19/2018 17:38     Microbiology: No results found for this or any previous visit (from the past 240 hour(s)).   Labs: Basic Metabolic Panel: Recent Labs  Lab 04/18/18 1853 04/19/18 0609  NA 135 137  K 4.1 3.8  CL 101 103  CO2 23 27  GLUCOSE 110* 85  BUN 24* 21*  CREATININE 1.35* 1.05*  CALCIUM 9.7 9.0   Liver Function Tests: Recent Labs  Lab 04/19/18 0609  AST 23  ALT 14  ALKPHOS 51  BILITOT 0.8  PROT 7.2  ALBUMIN 3.6   No results for input(s): LIPASE, AMYLASE in the last 168 hours. No results for input(s): AMMONIA in the last 168 hours. CBC: Recent Labs  Lab 04/18/18 1853 04/19/18 0609  WBC 9.5 7.7  NEUTROABS 5.9  --   HGB 14.3 12.9  HCT 42.4 39.2  MCV 99.8 100.3*  PLT 185 176   Cardiac Enzymes: Recent Labs  Lab 04/18/18 1853 04/19/18 0020 04/19/18 0609  TROPONINI <0.03 <0.03 <0.03   BNP: Invalid input(s): POCBNP CBG: No results for input(s): GLUCAP in the last 168 hours.  Time coordinating discharge:  36 minutes  Signed:  Catarina Hartshorn, DO [facility] Pager: [phone] 04/20/2018, 1:07 PM

## 2018-04-20 NOTE — ED Provider Notes (Signed)
[facility] MEDICAL SURGICAL UNIT Provider Note   CSN: [id number] Arrival date & time: 04/18/18  1815     History   Chief Complaint Chief Complaint  Patient presents with  . Shortness of Breath    HPI Rhonda Spears is a 84 y.o. female.  Patient complains of dyspnea on exertion.  She also states she has a pressure sensation in her chest when she exerts herself  The history is provided by the patient. No language interpreter was used.  Shortness of Breath  This is a new problem. The problem occurs continuously.The current episode started more than 2 days ago. The problem has not changed since onset.Pertinent negatives include no fever, no headaches, no cough, no chest pain, no abdominal pain and no rash. She has tried nothing for the symptoms. The treatment provided no relief. She has had prior hospitalizations. She has had no prior ED visits.    Past Medical History:  Diagnosis Date  . Chest pain with moderate risk for cardiac etiology 03/30/2017  . Gout   . Hypertension   . PVC's (premature ventricular contractions) 03/2017  . Renal disorder     Patient Active Problem List   Diagnosis Date Noted  . HTN (hypertension) 03/30/2017  . Atypical chest pain 03/30/2017  . Chronic headache disorder 03/30/2017  . Osteoarthritis 03/30/2017  . Venous insufficiency 03/30/2017  . Diverticulosis 03/30/2017  . Bigeminy 03/30/2017  . DOE (dyspnea on exertion) 03/30/2017  . Idiopathic hypotension 03/30/2017  . Chest pain with moderate risk for cardiac etiology 03/30/2017  . Gout   . Renal disorder     Past Surgical History:  Procedure Laterality Date  . arthroscopic left knee    . repair left femoral neck    . right medial men       OB History   None      Home Medications    Prior to Admission medications   Medication Sig Start Date End Date Taking? Authorizing Provider  allopurinol (ZYLOPRIM) 100 MG tablet Take 50 mg by mouth at bedtime.    Yes [provider]  amitriptyline (ELAVIL) 10 MG tablet Take 5 mg by mouth at bedtime.    Yes [provider]  aspirin EC 81 MG tablet Take 81 mg by mouth 3 (three) times a week.    Yes [provider]  atenolol (TENORMIN) 25 MG tablet Take 1 tablet (25 mg total) by mouth daily. Patient taking differently: Take 12.5 mg by mouth every morning.  04/01/17  Yes Mikhail, [place], DO  Bee Pollen 550 MG CAPS Take 1 capsule by mouth every morning.    Yes [provider]  Calcium Carb-Cholecalciferol (CALCIUM 600+D) 600-800 MG-UNIT TABS Take 1 tablet by mouth every morning.    Yes [provider]  cholecalciferol (VITAMIN D) 1000 units tablet Take 1,000 Units by mouth every morning.    Yes [provider]  docusate sodium (COLACE) 100 MG capsule Take 100 mg by mouth daily as needed for mild constipation or moderate constipation.    Yes [provider]  folic acid (FOLVITE) 400 MCG tablet Take 400 mcg by mouth at bedtime.   Yes [provider]  HYDROcodone-acetaminophen (NORCO/VICODIN) 5-325 MG tablet Take 1 tablet by mouth every 6 (six) hours as needed for moderate pain.   Yes [provider]  Magnesium 250 MG TABS Take 1 tablet by mouth daily.   Yes [provider]  Multiple Vitamin (MULTIVITAMIN) capsule Take 1 capsule by mouth  daily.   Yes [provider]  spironolactone (ALDACTONE) 25 MG tablet Take 25 mg by mouth 2 (two) times daily.    Yes [provider]    Family History Family History  Problem Relation Age of Onset  . Heart attack Mother   . Hypertension Mother   . Early death Father 22       Car accident    Social History Social History   Tobacco Use  . Smoking status: Never Smoker  . Smokeless tobacco: Never Used  Substance Use Topics  . Alcohol use: No  . Drug use: No     Allergies   Latex; Omeprazole; Tramadol; Sulfa antibiotics; and Zanaflex [tizanidine hcl]   Review of  Systems Review of Systems  Constitutional: Negative for appetite change, fatigue and fever.  HENT: Negative for congestion, ear discharge and sinus pressure.   Eyes: Negative for discharge.  Respiratory: Positive for shortness of breath. Negative for cough.   Cardiovascular: Negative for chest pain.  Gastrointestinal: Negative for abdominal pain and diarrhea.  Genitourinary: Negative for frequency and hematuria.  Musculoskeletal: Negative for back pain.  Skin: Negative for rash.  Neurological: Negative for seizures and headaches.  Psychiatric/Behavioral: Negative for hallucinations.     Physical Exam Updated Vital Signs BP 105/77 (BP Location: Right Arm)   Pulse 63   Temp 97.6 F (36.4 C) (Oral)   Resp 16   Ht 5' 2.5" (1.588 m)   Wt 61.7 kg (136 lb 0.4 oz)   SpO2 96%   BMI 24.48 kg/m   Physical Exam  Constitutional: She is oriented to person, place, and time. She appears well-developed.  HENT:  Head: Normocephalic.  Eyes: Conjunctivae and EOM are normal. No scleral icterus.  Neck: Neck supple. No thyromegaly present.  Cardiovascular: Normal rate and regular rhythm. Exam reveals no gallop and no friction rub.  No murmur heard. Pulmonary/Chest: No stridor. She has no wheezes. She has no rales. She exhibits no tenderness.  Abdominal: She exhibits no distension. There is no tenderness. There is no rebound.  Musculoskeletal: Normal range of motion. She exhibits no edema.  Lymphadenopathy:    She has no cervical adenopathy.  Neurological: She is oriented to person, place, and time. She exhibits normal muscle tone. Coordination normal.  Skin: No rash noted. No erythema.  Psychiatric: She has a normal mood and affect. Her behavior is normal.     ED Treatments / Results  Labs (all labs ordered are listed, but only abnormal results are displayed) Labs Reviewed  BASIC METABOLIC PANEL - Abnormal; Notable for the following components:      Result Value   Glucose, Bld 110 (*)     BUN 24 (*)    Creatinine, Ser 1.35 (*)    GFR calc non Af Amer 36 (*)    GFR calc Af Amer 41 (*)    All other components within normal limits  BRAIN NATRIURETIC PEPTIDE - Abnormal; Notable for the following components:   B Natriuretic Peptide 184.0 (*)    All other components within normal limits  CBC - Abnormal; Notable for the following components:   MCV 100.3 (*)    All other components within normal limits  COMPREHENSIVE METABOLIC PANEL - Abnormal; Notable for the following components:   BUN 21 (*)    Creatinine, Ser 1.05 (*)    GFR calc non Af Amer 48 (*)    GFR calc Af Amer 56 (*)    All other components within normal limits  D-DIMER, QUANTITATIVE (NOT AT [facility]) - Abnormal; Notable for the following components:   D-Dimer, Quant 1.06 (*)    All other components within normal limits  CBC WITH DIFFERENTIAL/PLATELET  TROPONIN I  TROPONIN I  TROPONIN I    EKG EKG Interpretation  Date/Time:  Thursday April 18 2018 18:37:28 EDT Ventricular Rate:  67 PR Interval:  202 QRS Duration: 80 QT Interval:  392 QTC Calculation: 414 R Axis:   -23 Text Interpretation:  Sinus rhythm with frequent Premature ventricular complexes Low voltage QRS Septal infarct , age undetermined Abnormal ECG Confirmed by Bethann Berkshire [id number]) on 04/18/2018 7:37:33 PM Also confirmed by Bethann Berkshire [phone])  on 04/18/2018 9:19:21 PM   Radiology Dg Chest 2 View  Result Date: 04/18/2018 CLINICAL DATA:  Initial evaluation for acute shortness of breath for 1 month. EXAM: CHEST - 2 VIEW COMPARISON:  Prior radiograph from 03/30/2017 FINDINGS: Cardiac and mediastinal silhouettes are stable in size and contour, and remain within normal limits. Aortic atherosclerosis. Lungs are hyperinflated with underlying emphysema. Pleuroparenchymal scarring noted at the bilateral lung apices and right perihilar region. No focal infiltrates. No pulmonary edema or pleural effusion. Negative for pneumothorax. Exaggeration of the  normal thoracic kyphosis. Osteopenia. No acute osseous abnormality. IMPRESSION: 1. Emphysema with pleuroparenchymal pulmonary scarring, similar to previous. 2. No other active cardiopulmonary disease. 3. Aortic atherosclerosis. Electronically Signed   By: Rise Mu M.D.   On: 04/18/2018 19:15   Ct Angio Chest Pe W Or Wo Contrast  Result Date: 04/19/2018 CLINICAL DATA:  Shortness of breath and dyspnea on exertion for 1 month worsened since 04/18/2018, history hypertension, sinus node dysfunction, presumptive COPD EXAM: CT ANGIOGRAPHY CHEST WITH CONTRAST TECHNIQUE: Multidetector CT imaging of the chest was performed using the standard protocol during bolus administration of intravenous contrast. Multiplanar CT image reconstructions and MIPs were obtained to evaluate the vascular anatomy. CONTRAST:  75mL ISOVUE-370 IOPAMIDOL (ISOVUE-370) INJECTION 76% IV COMPARISON:  06/02/2007 FINDINGS: Cardiovascular: Atherosclerotic calcifications aorta, coronary arteries and proximal great vessels. Cardiac chambers enlarged. No pericardial effusion. Pulmonary arteries adequately opacified and patent. No evidence of pulmonary embolism. Mediastinum/Nodes: Small hiatal hernia. Esophagus unremarkable. Base of cervical region normal appearance. No thoracic adenopathy. Lungs/Pleura: Emphysematous and bronchitic changes. Scattered subpleural interstitial thickening throughout both lungs greatest in upper lobes, chronic. Biapical scarring. Few scattered nodular foci in both lungs, largest 5 mm diameter LEFT lower lobe image 79. No segmental infiltrate, pleural effusion or pneumothorax. Minimal bronchiectasis in medial RIGHT upper lobe. Upper Abdomen: Visualized upper abdomen unremarkable. Musculoskeletal: Mild thoracolumbar scoliosis and diffuse osseous demineralization with scattered degenerative disc disease changes. Review of the MIP images confirms the above findings. IMPRESSION: No evidence of pulmonary embolism. COPD  changes with scattered areas of scarring. Scattered atherosclerotic calcifications including coronary arteries. Nodular foci up to 5 mm diameter, recommendation below. No follow-up needed if patient is low-risk (and has no known or suspected primary neoplasm). Non-contrast chest CT can be considered in 12 months if patient is high-risk. This recommendation follows the consensus statement: Guidelines for Management of Incidental Pulmonary Nodules Detected on CT Images: From the Fleischner Society 2017; Radiology 2017; 284:228-243. Aortic Atherosclerosis (ICD10-I70.0) and Emphysema (ICD10-J43.9). Electronically Signed   By: Ulyses Southward M.D.   On: 04/19/2018 16:54   US Venous Img Lower Bilateral  Result Date: 04/19/2018 CLINICAL DATA:  Bilateral lower extremity pain and edema for the past month. Shortness of breath. Evaluate for DVT. EXAM: BILATERAL LOWER EXTREMITY VENOUS DOPPLER ULTRASOUND TECHNIQUE: Gray-scale sonography with graded compression, as  well as color Doppler and duplex ultrasound were performed to evaluate the lower extremity deep venous systems from the level of the common femoral vein and including the common femoral, femoral, profunda femoral, popliteal and calf veins including the posterior tibial, peroneal and gastrocnemius veins when visible. The superficial great saphenous vein was also interrogated. Spectral Doppler was utilized to evaluate flow at rest and with distal augmentation maneuvers in the common femoral, femoral and popliteal veins. COMPARISON:  None. FINDINGS: RIGHT LOWER EXTREMITY Common Femoral Vein: No evidence of thrombus. Normal compressibility, respiratory phasicity and response to augmentation. Saphenofemoral Junction: No evidence of thrombus. Normal compressibility and flow on color Doppler imaging. Profunda Femoral Vein: No evidence of thrombus. Normal compressibility and flow on color Doppler imaging. Femoral Vein: No evidence of thrombus. Normal compressibility,  respiratory phasicity and response to augmentation. Popliteal Vein: No evidence of thrombus. Normal compressibility, respiratory phasicity and response to augmentation. Calf Veins: No evidence of thrombus. Normal compressibility and flow on color Doppler imaging. Superficial Great Saphenous Vein: No evidence of thrombus. Normal compressibility. Venous Reflux:  None. Other Findings:  None. LEFT LOWER EXTREMITY Common Femoral Vein: No evidence of thrombus. Normal compressibility, respiratory phasicity and response to augmentation. Saphenofemoral Junction: No evidence of thrombus. Normal compressibility and flow on color Doppler imaging. Profunda Femoral Vein: No evidence of thrombus. Normal compressibility and flow on color Doppler imaging. Femoral Vein: No evidence of thrombus. Normal compressibility, respiratory phasicity and response to augmentation. Popliteal Vein: No evidence of thrombus. Normal compressibility, respiratory phasicity and response to augmentation. Calf Veins: No evidence of thrombus. Normal compressibility and flow on color Doppler imaging. Superficial Great Saphenous Vein: No evidence of thrombus. Normal compressibility. Venous Reflux:  None. Other Findings:  None. IMPRESSION: No evidence of DVT within either lower extremity. Electronically Signed   By: Simonne Come M.D.   On: 04/19/2018 17:38    Procedures Procedures (including critical care time)  Medications Ordered in ED Medications  allopurinol (ZYLOPRIM) tablet 100 mg (100 mg Oral Given 04/19/18 2217)  amitriptyline (ELAVIL) tablet 5 mg (5 mg Oral Given 04/19/18 2217)  aspirin EC tablet 81 mg (81 mg Oral Given 04/19/18 0939)  folic acid (FOLVITE) tablet 0.5 mg (0.5 mg Oral Given 04/19/18 2217)  atenolol (TENORMIN) tablet 12.5 mg (12.5 mg Oral Given 04/19/18 0939)  enoxaparin (LOVENOX) injection 30 mg (30 mg Subcutaneous Given 04/19/18 2218)  0.9 %  sodium chloride infusion ( Intravenous Restarted 04/19/18 0540)  ondansetron (ZOFRAN)  tablet 4 mg (has no administration in time range)    Or  ondansetron (ZOFRAN) injection 4 mg (has no administration in time range)  acetaminophen (TYLENOL) tablet 650 mg (650 mg Oral Given 04/19/18 1005)    Or  acetaminophen (TYLENOL) suppository 650 mg ( Rectal See Alternative 04/19/18 1005)  albuterol (PROVENTIL) (2.5 MG/3ML) 0.083% nebulizer solution 2.5 mg (has no administration in time range)  magnesium oxide (MAG-OX) tablet 400 mg (400 mg Oral Given 04/19/18 0939)  iopamidol (ISOVUE-370) 76 % injection 75 mL (75 mLs Intravenous Contrast Given 04/19/18 1638)     Initial Impression / Assessment and Plan / ED Course  I have reviewed the triage vital signs and the nursing notes.  Pertinent labs & imaging results that were available during my care of the patient were reviewed by me and considered in my medical decision making (see chart for details).     Labs unremarkable.  Patient will be admitted for dyspnea on exertion chest pressure.  She will have troponins cycled  Final  Clinical Impressions(s) / ED Diagnoses   Final diagnoses:  Leg edema    ED Discharge Orders    None       Bethann BerkshireZammit, Alexas Basulto, MD 04/20/18 [phone]

## 2018-04-20 NOTE — Progress Notes (Signed)
IVs removed, 2x2 gauze and paper tape applied to site, patient tolerated well.  Reviewed AVS with patient, patient's husband, and patient's daughter, and all verbalized understanding.  Patient transported home via husband.

## 2018-05-09 ENCOUNTER — Encounter: Payer: Self-pay | Admitting: Physician Assistant

## 2018-05-09 DIAGNOSIS — I493 Ventricular premature depolarization: Secondary | ICD-10-CM | POA: Insufficient documentation

## 2018-05-09 NOTE — Progress Notes (Signed)
Cardiology Office Note    Date:  05/10/2018  ID:  Rhonda Spears, DOB 07-31-1934, MRN [id number] PCP:  Kela Millin, MD  Cardiologist:  Lewayne Bunting, MD  Chief Complaint: shortness of breath  History of Present Illness:  Rhonda Spears is a 84 y.o. female with history of HTN, sinus node dysfunction/sinus bradycardia, PVCs, COPD by imaging, CKD stage III, lower extremity edema felt due to venous insufficiency who presents for post-hospital follow-up.  Apparently some time a few years prior she'd had an event monitor at [facility] that showed PVCs. She was remotely seen in 03/2017 by Dr. Ladona Ridgel for chest pain and shortness of breath. 2D echo at that time showed normal LVEF and grade 2 DD. Stress test was normal. (DC summary states heart rate was in the 30s at times so atenolol dose was decreased from 25 mg twice daily to 25 mg daily - this was not mentioned in cardiology notes.) At last OV 09/2017 atenolol was continued to help with PVCs - HR was 44, but no EKG done at that time so unclear if true sinus bradycardia or frequent PVCs. In 03/2018 she presented with worsening chest pressure and dyspnea. 2D echo 04/19/18 showed EF 60-65%, grade 2 DD, mild MR, mild LAE, mild-mod TR, mildly increased PASP. CT angio was negative for PE but did show COPD, coronary calcifications, and some nodular focus in the lung (recommended CT in 12 months if high risk, not needed if low risk), LE venous duplex negative. She r/o for MI. Initially Dr. Purvis Sheffield recommended transfer to [facility] for [facility]. However, the patient ambulated the day of discharge around nursing unit without significant recurrent symptoms so outpatient follow-up was recommended. Spironolactone was stopped due to low BP. Labs showed BNP 184, K 3.8, Cr 1.05, LFTs OK, Hgb 12.9, Plt 176, 03/2017 TSH wnl.  She returns for follow-up today with her son and daughter. SOB has returned some. Not as bad as it was when prompting admission but enough to be  noticeable with exertion. No recurrent chest pain. Edema is slightly worse as well. No orthopnea. HR initially registered as 30s but EKG confirms NSR with ventricular bigeminy likely accounting for the pickup.   Past Medical History:  Diagnosis Date  . Chest pain with moderate risk for cardiac etiology 03/30/2017  . CKD (chronic kidney disease), stage III (HCC)   . COPD (chronic obstructive pulmonary disease) (HCC)   . Gout   . Hypertension   . PVC's (premature ventricular contractions)   . Renal disorder   . Sinus bradycardia   . Tricuspid regurgitation   . Venous insufficiency     Past Surgical History:  Procedure Laterality Date  . arthroscopic left knee    . repair left femoral neck    . right medial men      Current Medications: Current Meds  Medication Sig  . allopurinol (ZYLOPRIM) 100 MG tablet Take 50 mg by mouth at bedtime.   Marland Kitchen amitriptyline (ELAVIL) 10 MG tablet Take 5 mg by mouth at bedtime.   Marland Kitchen aspirin EC 81 MG tablet Take 81 mg by mouth 3 (three) times a week.   Marland Kitchen atenolol (TENORMIN) 25 MG tablet Take 1 tablet (25 mg total) by mouth daily. (Patient taking differently: Take 12.5 mg by mouth every morning. )  . Calcium Carb-Cholecalciferol (CALCIUM 600+D) 600-800 MG-UNIT TABS Take 1 tablet by mouth every morning.   . cholecalciferol (VITAMIN D) 1000 units tablet Take 1,000 Units by mouth every morning.   Marland Kitchen  docusate sodium (COLACE) 100 MG capsule Take 100 mg by mouth daily as needed for mild constipation or moderate constipation.   . folic acid (FOLVITE) 400 MCG tablet Take 400 mcg by mouth at bedtime.  Marland Kitchen HYDROcodone-acetaminophen (NORCO/VICODIN) 5-325 MG tablet Take 1 tablet by mouth every 6 (six) hours as needed for moderate pain.  . Magnesium 250 MG TABS Take 1 tablet by mouth daily.  . Multiple Vitamin (MULTIVITAMIN) capsule Take 1 capsule by mouth daily.    Allergies:   Latex; Omeprazole; Tramadol; Sulfa antibiotics; and Zanaflex [tizanidine hcl]   Social  History   Socioeconomic History  . Marital status: Divorced    Spouse name: Not on file  . Number of children: Not on file  . Years of education: Not on file  . Highest education level: Not on file  Occupational History  . Occupation: Retired  Engineer, production  . Financial resource strain: Not on file  . Food insecurity:    Worry: Not on file    Inability: Not on file  . Transportation needs:    Medical: Not on file    Non-medical: Not on file  Tobacco Use  . Smoking status: Never Smoker  . Smokeless tobacco: Never Used  Substance and Sexual Activity  . Alcohol use: No  . Drug use: No  . Sexual activity: Not on file  Lifestyle  . Physical activity:    Days per week: Not on file    Minutes per session: Not on file  . Stress: Not on file  Relationships  . Social connections:    Talks on phone: Not on file    Gets together: Not on file    Attends religious service: Not on file    Active member of club or organization: Not on file    Attends meetings of clubs or organizations: Not on file    Relationship status: Not on file  Other Topics Concern  . Not on file  Social History Narrative   Pt lives alone in [place], [place]     Family History:  The patient's family history includes Early death (age of onset: 60) in her father; Heart attack in her mother; Hypertension in her mother.  ROS:   Please see the history of present illness.   All other systems are reviewed and otherwise negative.    PHYSICAL EXAM:   VS:  BP 130/70   Pulse 63   Ht 5\' 1"  (1.549 m)   Wt 140 lb (63.5 kg)   BMI 26.45 kg/m   BMI: Body mass index is 26.45 kg/m. GEN: Well nourished, well developed WF, in no acute distress HEENT: normocephalic, atraumatic Neck: no JVD, carotid bruits, or masses Cardiac: Regularly irregular rhythm with ventricular bigeminy; no murmurs, rubs, or gallops, trace-1+ BLE edema, equal bilaterally Respiratory:  clear to auscultation bilaterally, normal work of breathing GI:  soft, nontender, nondistended, + BS MS: kyphotic posture Skin: warm and dry, no rash Neuro:  Alert and Oriented x 3, Strength and sensation are intact, follows commands Psych: euthymic mood, full affect  Wt Readings from Last 3 Encounters:  05/10/18 140 lb (63.5 kg)  04/19/18 136 lb 0.4 oz (61.7 kg)  10/01/17 135 lb (61.2 kg)      Studies/Labs Reviewed:   EKG:  EKG was ordered today and personally reviewed by me and demonstrates NSR 63bpm with ventricular bigeminy. Interpretation otherwise limited by ectopy.  Recent Labs: 04/18/2018: B Natriuretic Peptide 184.0 04/19/2018: ALT 14; BUN 21; Creatinine, Ser 1.05; Hemoglobin  12.9; Platelets 176; Potassium 3.8; Sodium 137   Lipid Panel No results found for: CHOL, TRIG, HDL, CHOLHDL, VLDL, LDLCALC, LDLDIRECT  Additional studies/ records that were reviewed today include: Summarized above   ASSESSMENT & PLAN:   1. Chest discomfort/dyspnea - chest discomfort resolved but DOE persists. I reviewed with Dr. Wyline MoodBranch today (DOD) given negative enzymes. Differential diagnosis includes related to PVCs, chronotropic incompetence, coronary ischemia or progressive COPD. Check lytes today. Will 24 hour monitor to quantify PVC burden and heart rate excursion. Will proceed with Lexiscan nuclear stress test to evaluate for ischemia. She does not think she can walk on a treadmill. She is not wheezing and should be able to tolerate the Lexiscan. She has not had any documented heart block before. May need to see EP back for her PVCs depending on what we find. If cardiac workup unrevealing, may need pulm referral. 2. Essential HTN - follow BP with Lasix as below. 3. PVCs - plan 24 hour Holter as above. 4. Sinus bradycardia - I'm not really clear on whether this has been proven sinus bradycardia or just pseudobradycardia from the PVCs. Follow with 24-hour holter. 5. Venous insufficiency - slightly worse recently, perhaps in setting of increased ectopy. BNP fairly  low in the hospital, but will trend given worsening edema and return of dyspnea. Start Lasix 20mg  daily. I have given her permission to stop this and only use PRN if swelling resolves. Reviewed 2g sodium restriction, 2L fluid restriction, compression hose.   Disposition: F/u with APP/cardiologist in 2 weeks.  Medication Adjustments/Labs and Tests Ordered: Current medicines are reviewed at length with the patient today.  Concerns regarding medicines are outlined above. Medication changes, Labs and Tests ordered today are summarized above and listed in the Patient Instructions accessible in Encounters.   Signed, Laurann MontanaDayna N Dunn, PA-C  05/10/2018 2:51 PM    [facility] [place] Location in [facility] [address] [place], [place] [address] Ph: [phone]; Fax [phone]

## 2018-05-10 ENCOUNTER — Ambulatory Visit (INDEPENDENT_AMBULATORY_CARE_PROVIDER_SITE_OTHER): Payer: Medicare Other | Admitting: Physician Assistant

## 2018-05-10 ENCOUNTER — Other Ambulatory Visit (HOSPITAL_COMMUNITY)
Admission: RE | Admit: 2018-05-10 | Discharge: 2018-05-10 | Disposition: A | Discharge status: Home / Self Care | Payer: Medicare Other | Source: Ambulatory Visit | Attending: Physician Assistant | Admitting: Physician Assistant

## 2018-05-10 ENCOUNTER — Encounter: Payer: Self-pay | Admitting: Physician Assistant

## 2018-05-10 VITALS — BP 130/70 | HR 63 | Ht 61.0 in | Wt 140.0 lb

## 2018-05-10 DIAGNOSIS — I493 Ventricular premature depolarization: Secondary | ICD-10-CM | POA: Diagnosis not present

## 2018-05-10 DIAGNOSIS — R001 Bradycardia, unspecified: Secondary | ICD-10-CM | POA: Diagnosis not present

## 2018-05-10 DIAGNOSIS — I10 Essential (primary) hypertension: Secondary | ICD-10-CM

## 2018-05-10 DIAGNOSIS — R0609 Other forms of dyspnea: Secondary | ICD-10-CM | POA: Diagnosis not present

## 2018-05-10 DIAGNOSIS — I872 Venous insufficiency (chronic) (peripheral): Secondary | ICD-10-CM

## 2018-05-10 LAB — CBC WITH DIFFERENTIAL/PLATELET
BASOS PCT: 1 %
Basophils Absolute: 0.1 10*3/uL (ref 0.0–0.1)
EOS ABS: 0.3 10*3/uL (ref 0.0–0.7)
EOS PCT: 3 %
HEMATOCRIT: 42.2 % (ref 36.0–46.0)
Hemoglobin: 14.3 g/dL (ref 12.0–15.0)
Lymphocytes Relative: 44 %
Lymphs Abs: 4.3 10*3/uL — ABNORMAL HIGH (ref 0.7–4.0)
MCH: 34 pg (ref 26.0–34.0)
MCHC: 33.9 g/dL (ref 30.0–36.0)
MCV: 100.2 fL — ABNORMAL HIGH (ref 78.0–100.0)
MONO ABS: 0.8 10*3/uL (ref 0.1–1.0)
MONOS PCT: 8 %
Neutro Abs: 4.4 10*3/uL (ref 1.7–7.7)
Neutrophils Relative %: 44 %
PLATELETS: 208 10*3/uL (ref 150–400)
RBC: 4.21 MIL/uL (ref 3.87–5.11)
RDW: 13.9 % (ref 11.5–15.5)
WBC: 9.7 10*3/uL (ref 4.0–10.5)

## 2018-05-10 LAB — BASIC METABOLIC PANEL
Anion gap: 9 (ref 5–15)
BUN: 16 mg/dL (ref 8–23)
CALCIUM: 9.8 mg/dL (ref 8.9–10.3)
CO2: 27 mmol/L (ref 22–32)
CREATININE: 1.05 mg/dL — AB (ref 0.44–1.00)
Chloride: 102 mmol/L (ref 98–111)
GFR calc Af Amer: 56 mL/min — ABNORMAL LOW (ref >60)
GFR calc non Af Amer: 48 mL/min — ABNORMAL LOW (ref >60)
GLUCOSE: 93 mg/dL (ref 70–99)
Potassium: 4.1 mmol/L (ref 3.5–5.1)
Sodium: 138 mmol/L (ref 135–145)

## 2018-05-10 LAB — BRAIN NATRIURETIC PEPTIDE: B NATRIURETIC PEPTIDE 5: 207 pg/mL — AB (ref 0.0–100.0)

## 2018-05-10 LAB — MAGNESIUM: Magnesium: 2.2 mg/dL (ref 1.7–2.4)

## 2018-05-10 MED ORDER — FUROSEMIDE 20 MG PO TABS: 20.0000 mg | ORAL_TABLET | Freq: Every day | ORAL | 3 refills | Status: DC

## 2018-05-10 NOTE — Patient Instructions (Addendum)
Medication Instructions:  START LASIX 20 MG DAILY   Labwork: TODAY    Testing/Procedures: Your physician has requested that you have a lexiscan myoview. For further information please visit [email or web address]. Please follow instruction sheet, as given.  Your physician has recommended that you wear a holter monitor. Holter monitors are medical devices that record the heart's electrical activity. Doctors most often use these monitors to diagnose arrhythmias. Arrhythmias are problems with the speed or rhythm of the heartbeat. The monitor is a small, portable device. You can wear one while you do your normal daily activities. This is usually used to diagnose what is causing palpitations/syncope (passing out).    Follow-Up: Your physician recommends that you schedule a follow-up appointment in: 2 WEEKS AFTER STRESS TEST    Any Other Special Instructions Will Be Listed Below (If Applicable).     If you need a refill on your cardiac medications before your next appointment, please call your pharmacy.    For patients with swelling, we give them these special instructions:  1. Follow a low-salt diet - you are allowed no more than 2,000mg  of sodium per day. Watch your fluid intake. In general, you should not be taking in more than 2 liters of fluid per day (no more than 8 glasses per day). This includes sources of water in foods like soup, coffee, tea, milk, etc. 2. Weigh yourself on the same scale at same time of day and keep a log. 3. Call your doctor: (Anytime you feel any of the following symptoms)  - 3lb weight gain overnight or 5lb within a few days - Shortness of breath, with or without a dry hacking cough  - Swelling in the hands, feet or stomach  - If you have to sleep on extra pillows at night in order to breathe   IT IS IMPORTANT TO LET YOUR DOCTOR KNOW EARLY ON IF YOU ARE HAVING SYMPTOMS SO WE CAN HELP YOU!

## 2018-05-20 ENCOUNTER — Encounter (HOSPITAL_BASED_OUTPATIENT_CLINIC_OR_DEPARTMENT_OTHER)
Admission: RE | Admit: 2018-05-20 | Discharge: 2018-05-20 | Disposition: A | Discharge status: Home / Self Care | Payer: Medicare Other | Source: Ambulatory Visit | Attending: Physician Assistant | Admitting: Physician Assistant

## 2018-05-20 ENCOUNTER — Encounter (HOSPITAL_COMMUNITY)
Admission: RE | Admit: 2018-05-20 | Discharge: 2018-05-20 | Disposition: A | Discharge status: Home / Self Care | Payer: Medicare Other | Source: Ambulatory Visit | Attending: Physician Assistant | Admitting: Physician Assistant

## 2018-05-20 DIAGNOSIS — R0609 Other forms of dyspnea: Secondary | ICD-10-CM | POA: Insufficient documentation

## 2018-05-20 DIAGNOSIS — I493 Ventricular premature depolarization: Secondary | ICD-10-CM

## 2018-05-20 LAB — NM MYOCAR MULTI W/SPECT W/WALL MOTION / EF
CHL CUP NUCLEAR SSS: 2
CSEPPHR: 75 {beats}/min
LHR: 0.37
LVDIAVOL: 66 mL (ref 46–106)
LVSYSVOL: 18 mL
Rest HR: 55 {beats}/min
SDS: 1
SRS: 1
TID: 1

## 2018-05-20 MED ORDER — TECHNETIUM TC 99M TETROFOSMIN IV KIT
10.0000 | PACK | Freq: Once | INTRAVENOUS | Status: AC | PRN
  Administered 2018-05-20: 10 via INTRAVENOUS

## 2018-05-20 MED ORDER — REGADENOSON 0.4 MG/5ML IV SOLN
INTRAVENOUS | Status: AC
  Administered 2018-05-20: 0.4 mg via INTRAVENOUS
  Filled 2018-05-20: qty 5

## 2018-05-20 MED ORDER — TECHNETIUM TC 99M TETROFOSMIN IV KIT
30.0000 | PACK | Freq: Once | INTRAVENOUS | Status: AC | PRN
  Administered 2018-05-20: 30 via INTRAVENOUS

## 2018-05-20 MED ORDER — SODIUM CHLORIDE 0.9% FLUSH
INTRAVENOUS | Status: AC
  Administered 2018-05-20: 10 mL via INTRAVENOUS
  Filled 2018-05-20: qty 10

## 2018-05-23 ENCOUNTER — Other Ambulatory Visit (HOSPITAL_COMMUNITY)
Admission: RE | Admit: 2018-05-23 | Discharge: 2018-05-23 | Disposition: A | Discharge status: Home / Self Care | Payer: Medicare Other | Source: Ambulatory Visit | Attending: Physician Assistant | Admitting: Physician Assistant

## 2018-05-23 ENCOUNTER — Telehealth: Payer: Self-pay | Admitting: *Deleted

## 2018-05-23 DIAGNOSIS — Z79899 Other long term (current) drug therapy: Secondary | ICD-10-CM

## 2018-05-23 LAB — BASIC METABOLIC PANEL
Anion gap: 9 (ref 5–15)
BUN: 17 mg/dL (ref 8–23)
CALCIUM: 9.5 mg/dL (ref 8.9–10.3)
CO2: 28 mmol/L (ref 22–32)
Chloride: 99 mmol/L (ref 98–111)
Creatinine, Ser: 1.06 mg/dL — ABNORMAL HIGH (ref 0.44–1.00)
GFR calc non Af Amer: 48 mL/min — ABNORMAL LOW (ref >60)
GFR, EST AFRICAN AMERICAN: 56 mL/min — AB (ref >60)
Glucose, Bld: 88 mg/dL (ref 70–99)
POTASSIUM: 3.6 mmol/L (ref 3.5–5.1)
SODIUM: 136 mmol/L (ref 135–145)

## 2018-05-23 NOTE — Telephone Encounter (Signed)
-----   Message from Laurann Montanaayna N Dunn, [place] sent at 05/23/2018  9:02 AM EDT ----- Please let patient know that monitor showed some frequent PVCs which could be accounting for some of her shortness of breath, but it's not clear. We can't really go up on her beta blocker due to baseline bradycardia.  I would suggest we go ahead and get her back in to see Dr. Ladona Ridgelaylor next available appointment.  We had originally recommended to see back in 2 weeks but since her f/u was pushed out (per epic appts section), I would suggest we try and get a BMET within the next few days to f/u how her kidney function looks on the Lasix.   If Dr. Lubertha Basqueaylor's appointment availability is really far out, keep the appointment with Elon JesterMichele to re-eval symptoms but also set up with GT. Thanks.  Dayna Dunn PA-C

## 2018-05-27 ENCOUNTER — Telehealth: Payer: Self-pay | Admitting: Physician Assistant

## 2018-05-27 ENCOUNTER — Telehealth: Payer: Self-pay | Admitting: *Deleted

## 2018-05-27 MED ORDER — POTASSIUM CHLORIDE CRYS ER 20 MEQ PO TBCR: 20.0000 meq | EXTENDED_RELEASE_TABLET | Freq: Every day | ORAL | 6 refills | Status: DC

## 2018-05-27 NOTE — Telephone Encounter (Signed)
-----   Message from Laurann Montanaayna N Dunn, [place] sent at 05/24/2018  7:40 AM EDT ----- Please call patient. Labs look good except potassium level is a little lower than optimal, likely b/c of the Lasix. Recommend potassium chloride 20meq daily if still taking the Lasix. Dayna Dunn PA-C

## 2018-05-27 NOTE — Telephone Encounter (Signed)
Informed [facility] pharmarcy that pt is no longer taking Spironolactone

## 2018-05-27 NOTE — Telephone Encounter (Signed)
This was not on her med intake list at last OV. Please clarify with patient if she is taking. Dayna Dunn PA-C

## 2018-05-27 NOTE — Telephone Encounter (Signed)
Patient informed and verbalized understanding of plan. 

## 2018-05-27 NOTE — Telephone Encounter (Signed)
Call to notify office that pt is currently on spironolactone 25 mg BID. Her last fill date was 6/14 for 180 pills.

## 2018-05-27 NOTE — Telephone Encounter (Signed)
Please call pharmacist regarding RX for Potassium/tg

## 2018-05-27 NOTE — Telephone Encounter (Signed)
Pt states that Spironolactone was D/C'd 2 weeks ago in the hospital.  Discharge summary 6-20 to 6-22 states to stop taking Spironolactone.

## 2018-05-28 NOTE — Telephone Encounter (Signed)
Thank you :)

## 2018-05-29 DIAGNOSIS — R600 Localized edema: Secondary | ICD-10-CM | POA: Insufficient documentation

## 2018-05-29 NOTE — Progress Notes (Signed)
Cardiology Office Note    Date:  06/03/2018   ID:  Rhonda Spears, DOB 04/14/1934, MRN [id number]  PCP:  Kela Millin, MD  Cardiologist: Lewayne Bunting, MD  Chief Complaint  Patient presents with  . Follow-up    History of Present Illness:  Rhonda Spears is a 84 y.o. female history of hypertension, sinus node dysfunction, sinus bradycardia, PVCs, CKD stage III, COPD, chronic lower extremity edema secondary to venous insufficiency.  Patient was hospitalized 03/2018 with chest pain and dyspnea.  2D echo normal LVEF grade 2 DD, CT angios was negative for PE but did show COPD with some nodular focus in the lung recommend follow-up in 12 months if high risk.  Patient ruled out for an MI.  Outpatient nuclear stress test low risk, normal LVEF 72%.  Patient was still having some dyspnea when seen in follow-up by Ronie Spies, PA-C.  EKG showed normal sinus rhythm with ventricular bigeminy so 24-hour Holter was done showed heart rate 53 to 98 bpm with frequent PVCs and ventricular bigeminy.  PVCs were present 6% of the total beats.  Lasix 20 mg daily was resumed for worsening leg edema.  BNP was low in the hospital.  Patient comes in today accompanied by her friend.She had significant headaches taking Lasix so she stopped it and the potassium.  She has chronic dyspnea on exertion that is unchanged.  Her leg edema is better.  She eats out all the time.  She is trying to watch her sodium more closely.    Past Medical History:  Diagnosis Date  . Chest pain with moderate risk for cardiac etiology 03/30/2017  . CKD (chronic kidney disease), stage III (HCC)   . COPD (chronic obstructive pulmonary disease) (HCC)   . Gout   . Hypertension   . PVC's (premature ventricular contractions)   . Renal disorder   . Sinus bradycardia   . Tricuspid regurgitation   . Venous insufficiency     Past Surgical History:  Procedure Laterality Date  . arthroscopic left knee    . repair left femoral neck      . right medial men      Current Medications: Current Meds  Medication Sig  . allopurinol (ZYLOPRIM) 100 MG tablet Take 50 mg by mouth at bedtime.   Marland Kitchen amitriptyline (ELAVIL) 10 MG tablet Take 5 mg by mouth at bedtime.   Marland Kitchen atenolol (TENORMIN) 25 MG tablet Take 1 tablet (25 mg total) by mouth daily. (Patient taking differently: Take 12.5 mg by mouth every morning. )  . Calcium Carb-Cholecalciferol (CALCIUM 600+D) 600-800 MG-UNIT TABS Take 1 tablet by mouth every morning.   . cholecalciferol (VITAMIN D) 1000 units tablet Take 1,000 Units by mouth every morning.   . docusate sodium (COLACE) 100 MG capsule Take 100 mg by mouth daily as needed for mild constipation or moderate constipation.   . folic acid (FOLVITE) 400 MCG tablet Take 400 mcg by mouth at bedtime.  Marland Kitchen HYDROcodone-acetaminophen (NORCO/VICODIN) 5-325 MG tablet Take 1 tablet by mouth every 6 (six) hours as needed for moderate pain.  . Magnesium 250 MG TABS Take 1 tablet by mouth daily.  . Multiple Vitamin (MULTIVITAMIN) capsule Take 1 capsule by mouth daily.  . [DISCONTINUED] aspirin EC 81 MG tablet Take 81 mg by mouth 3 (three) times a week.      Allergies:   Latex; Omeprazole; Tramadol; Lasix [furosemide]; Sulfa antibiotics; and Zanaflex [tizanidine hcl]   Social History   Socioeconomic History  .  Marital status: Divorced    Spouse name: Not on file  . Number of children: Not on file  . Years of education: Not on file  . Highest education level: Not on file  Occupational History  . Occupation: Retired  Engineer, production  . Financial resource strain: Not on file  . Food insecurity:    Worry: Not on file    Inability: Not on file  . Transportation needs:    Medical: Not on file    Non-medical: Not on file  Tobacco Use  . Smoking status: Never Smoker  . Smokeless tobacco: Never Used  Substance and Sexual Activity  . Alcohol use: No  . Drug use: No  . Sexual activity: Not on file  Lifestyle  . Physical activity:     Days per week: Not on file    Minutes per session: Not on file  . Stress: Not on file  Relationships  . Social connections:    Talks on phone: Not on file    Gets together: Not on file    Attends religious service: Not on file    Active member of club or organization: Not on file    Attends meetings of clubs or organizations: Not on file    Relationship status: Not on file  Other Topics Concern  . Not on file  Social History Narrative   Pt lives alone in [place], [place]     Family History:  The patient's   family history includes Early death (age of onset: 36) in her father; Heart attack in her mother; Hypertension in her mother.   ROS:   Please see the history of present illness.    Review of Systems  Constitution: Negative.  HENT: Negative.   Eyes: Positive for visual disturbance.  Cardiovascular: Positive for dyspnea on exertion.  Respiratory: Positive for cough.   Hematologic/Lymphatic: Negative.   Musculoskeletal: Positive for back pain. Negative for joint pain.  Gastrointestinal: Negative.   Genitourinary: Negative.   Neurological: Positive for headaches.   All other systems reviewed and are negative.   PHYSICAL EXAM:   VS:  BP (!) 108/58   Pulse (!) 52   Ht 5\' 1"  (1.549 m)   Wt 143 lb (64.9 kg)   SpO2 94%   BMI 27.02 kg/m   Physical Exam  GEN: Well nourished, well developed, in no acute distress  Neck: no JVD, carotid bruits, or masses Cardiac:RRR; some skipping, 1/6 diastolic murmur left sternal border  respiratory:  clear to auscultation bilaterally, normal work of breathing GI: soft, nontender, nondistended, + BS Ext: without cyanosis, clubbing, or edema, Good distal pulses bilaterally Neuro:  Alert and Oriented x 3 Psych: euthymic mood, full affect  Wt Readings from Last 3 Encounters:  06/03/18 143 lb (64.9 kg)  05/10/18 140 lb (63.5 kg)  04/19/18 136 lb 0.4 oz (61.7 kg)      Studies/Labs Reviewed:   EKG:  EKG is not ordered today.   Recent  Labs: 04/19/2018: ALT 14 05/10/2018: B Natriuretic Peptide 207.0; Hemoglobin 14.3; Magnesium 2.2; Platelets 208 05/23/2018: BUN 17; Creatinine, Ser 1.06; Potassium 3.6; Sodium 136   Lipid Panel No results found for: CHOL, TRIG, HDL, CHOLHDL, VLDL, LDLCALC, LDLDIRECT  Additional studies/ records that were reviewed today include:    Nuclear stress test 7/22/2019No diagnostic ST segment changes to indicate ischemia. Occasional PVCs noted in recovery.  Small, mild intensity, mid to apical inferior defect that is more prominent at rest and fixed, consistent with soft tissue attenuation.  No significant ischemic defects.  This is a low risk study.  Nuclear stress EF: 72%.    Venous Doppler 04/19/2018 IMPRESSION: No evidence of DVT within either lower extremity.     Electronically Signed   By: Simonne Come M.D.   On: 04/19/2018 17:38  2D echo 04/19/2018 Study Conclusions   - Left ventricle: The cavity size was normal. Wall thickness was   normal. Systolic function was normal. The estimated ejection   fraction was in the range of 60% to 65%. Wall motion was normal;   there were no regional wall motion abnormalities. Features are   consistent with a pseudonormal left ventricular filling pattern,   with concomitant abnormal relaxation and increased filling   pressure (grade 2 diastolic dysfunction). - Aortic valve: Mildly calcified annulus. Trileaflet. - Mitral valve: There was mild regurgitation. - Left atrium: The atrium was mildly dilated. - Atrial septum: No defect or patent foramen ovale was identified. - Tricuspid valve: There was mild-moderate regurgitation. - Pulmonary arteries: Systolic pressure was mildly increased. PA   peak pressure: 35 mm Hg (S).  24-hour monitor 05/20/2018 24-hour event monitor reviewed.  Predominant rhythm is sinus.  Heart rate ranged from 53 bpm up to 98 bpm with average heart rate 65 bpm.  There were no pauses.  No atrial fibrillation documented.   Episodes of frequent PVCs noted including ventricular bigeminy and some couplets.  There were no sustained ventricular events.  PVCs represented approximately 6% of total beats.  There were rare PACs.       ASSESSMENT:    1. DOE (dyspnea on exertion)   2. PVC's (premature ventricular contractions)   3. Essential hypertension   4. Lower extremity edema      PLAN:  In order of problems listed above: Dyspnea on exertion question secondary to PVCs occurring 6% of the time on recent 24-hour Holter.  Nuclear stress test negative for ischemia, 2D echo with normal LVEF grade 2 DD.  Follow-up with Dr. Ladona Ridgel.  PVCs followed by Dr. Ladona Ridgel and has an upcoming appointment 8/23 to see if he thinks this is causing her trouble.  Reluctant to increase atenolol because of low blood pressure and pulse of 72.  Essential hypertension blood pressure runs on the low side.  Lower extremity edema felt secondary to venous insufficiency.  Low-dose Lasix 20 mg daily given to her last office visit but she had headaches from it and stopped this.  2 g sodium diet, compression hose and leg elevation recommended.     Medication Adjustments/Labs and Tests Ordered: Current medicines are reviewed at length with the patient today.  Concerns regarding medicines are outlined above.  Medication changes, Labs and Tests ordered today are listed in the Patient Instructions below. Patient Instructions  Medication Instructions:  Your physician recommends that you continue on your current medications as directed. Please refer to the Current Medication list given to you today.   Labwork: NONE   Testing/Procedures: NONE   Follow-Up: Your physician recommends that you schedule a follow-up appointment with Dr. Ladona Ridgel    Any Other Special Instructions Will Be Listed Below (If Applicable).     If you need a refill on your cardiac medications before your next appointment, please call your pharmacy.  Thank you for  choosing [facility]!   Low-Sodium Eating Plan Sodium, which is an element that makes up salt, helps you maintain a healthy balance of fluids in your body. Too much sodium can increase your blood pressure and cause  fluid and waste to be held in your body. Your health care provider or dietitian may recommend following this plan if you have high blood pressure (hypertension), kidney disease, liver disease, or heart failure. Eating less sodium can help lower your blood pressure, reduce swelling, and protect your heart, liver, and kidneys. What are tips for following this plan? General guidelines  Most people on this plan should limit their sodium intake to 1,500-2,000 mg (milligrams) of sodium each day. Reading food labels  The Nutrition Facts label lists the amount of sodium in one serving of the food. If you eat more than one serving, you must multiply the listed amount of sodium by the number of servings.  Choose foods with less than 140 mg of sodium per serving.  Avoid foods with 300 mg of sodium or more per serving. Shopping  Look for lower-sodium products, often labeled as "low-sodium" or "no salt added."  Always check the sodium content even if foods are labeled as "unsalted" or "no salt added".  Buy fresh foods. ? Avoid canned foods and premade or frozen meals. ? Avoid canned, cured, or processed meats  Buy breads that have less than 80 mg of sodium per slice. Cooking  Eat more home-cooked food and less restaurant, buffet, and fast food.  Avoid adding salt when cooking. Use salt-free seasonings or herbs instead of table salt or sea salt. Check with your health care provider or pharmacist before using salt substitutes.  Cook with plant-based oils, such as canola, sunflower, or olive oil. Meal planning  When eating at a restaurant, ask that your food be prepared with less salt or no salt, if possible.  Avoid foods that contain MSG (monosodium glutamate). MSG is  sometimes added to [place] food, bouillon, and some canned foods. What foods are recommended? The items listed may not be a complete list. Talk with your dietitian about what dietary choices are best for you. Grains Low-sodium cereals, including oats, puffed wheat and rice, and shredded wheat. Low-sodium crackers. Unsalted rice. Unsalted pasta. Low-sodium bread. Whole-grain breads and whole-grain pasta. Vegetables Fresh or frozen vegetables. "No salt added" canned vegetables. "No salt added" tomato sauce and paste. Low-sodium or reduced-sodium tomato and vegetable juice. Fruits Fresh, frozen, or canned fruit. Fruit juice. Meats and other protein foods Fresh or frozen (no salt added) meat, poultry, seafood, and fish. Low-sodium canned tuna and salmon. Unsalted nuts. Dried peas, beans, and lentils without added salt. Unsalted canned beans. Eggs. Unsalted nut butters. Dairy Milk. Soy milk. Cheese that is naturally low in sodium, such as ricotta cheese, fresh mozzarella, or Swiss cheese Low-sodium or reduced-sodium cheese. Cream cheese. Yogurt. Fats and oils Unsalted butter. Unsalted margarine with no trans fat. Vegetable oils such as canola or olive oils. Seasonings and other foods Fresh and dried herbs and spices. Salt-free seasonings. Low-sodium mustard and ketchup. Sodium-free salad dressing. Sodium-free light mayonnaise. Fresh or refrigerated horseradish. Lemon juice. Vinegar. Homemade, reduced-sodium, or low-sodium soups. Unsalted popcorn and pretzels. Low-salt or salt-free chips. What foods are not recommended? The items listed may not be a complete list. Talk with your dietitian about what dietary choices are best for you. Grains Instant hot cereals. Bread stuffing, pancake, and biscuit mixes. Croutons. Seasoned rice or pasta mixes. Noodle soup cups. Boxed or frozen macaroni and cheese. Regular salted crackers. Self-rising flour. Vegetables Sauerkraut, pickled vegetables, and relishes.  Olives. [place] fries. Onion rings. Regular canned vegetables (not low-sodium or reduced-sodium). Regular canned tomato sauce and paste (not low-sodium or reduced-sodium). Regular tomato  and vegetable juice (not low-sodium or reduced-sodium). Frozen vegetables in sauces. Meats and other protein foods Meat or fish that is salted, canned, smoked, spiced, or pickled. Bacon, ham, sausage, hotdogs, corned beef, chipped beef, packaged lunch meats, salt pork, jerky, pickled herring, anchovies, regular canned tuna, sardines, salted nuts. Dairy Processed cheese and cheese spreads. Cheese curds. Blue cheese. Feta cheese. String cheese. Regular cottage cheese. Buttermilk. Canned milk. Fats and oils Salted butter. Regular margarine. Ghee. Bacon fat. Seasonings and other foods Onion salt, garlic salt, seasoned salt, table salt, and sea salt. Canned and packaged gravies. Worcestershire sauce. Tartar sauce. Barbecue sauce. Teriyaki sauce. Soy sauce, including reduced-sodium. Steak sauce. Fish sauce. Oyster sauce. Cocktail sauce. Horseradish that you find on the shelf. Regular ketchup and mustard. Meat flavorings and tenderizers. Bouillon cubes. Hot sauce and Tabasco sauce. Premade or packaged marinades. Premade or packaged taco seasonings. Relishes. Regular salad dressings. Salsa. Potato and tortilla chips. Corn chips and puffs. Salted popcorn and pretzels. Canned or dried soups. Pizza. Frozen entrees and pot pies. Summary  Eating less sodium can help lower your blood pressure, reduce swelling, and protect your heart, liver, and kidneys.  Most people on this plan should limit their sodium intake to 1,500-2,000 mg (milligrams) of sodium each day.  Canned, boxed, and frozen foods are high in sodium. Restaurant foods, fast foods, and pizza are also very high in sodium. You also get sodium by adding salt to food.  Try to cook at home, eat more fresh fruits and vegetables, and eat less fast food, canned, processed, or  prepared foods. This information is not intended to replace advice given to you by your health care provider. Make sure you discuss any questions you have with your health care provider. Document Released: 04/07/2002 Document Revised: 10/09/2016 Document Reviewed: 10/09/2016 Elsevier Interactive Patient Education  [address]2018 [employer].     Signed, Jacolyn ReedyMichele Lenze, [place]-C  06/03/2018 11:36 AM    [facility] [address], [place], [place]  [id number] Phone: [phone]; Fax: [phone]

## 2018-06-03 ENCOUNTER — Ambulatory Visit (INDEPENDENT_AMBULATORY_CARE_PROVIDER_SITE_OTHER): Payer: Medicare Other | Admitting: Physician Assistant

## 2018-06-03 ENCOUNTER — Encounter: Payer: Self-pay | Admitting: Physician Assistant

## 2018-06-03 VITALS — BP 108/58 | HR 52 | Ht 61.0 in | Wt 143.0 lb

## 2018-06-03 DIAGNOSIS — I493 Ventricular premature depolarization: Secondary | ICD-10-CM

## 2018-06-03 DIAGNOSIS — R600 Localized edema: Secondary | ICD-10-CM

## 2018-06-03 DIAGNOSIS — R0609 Other forms of dyspnea: Secondary | ICD-10-CM

## 2018-06-03 DIAGNOSIS — I10 Essential (primary) hypertension: Secondary | ICD-10-CM

## 2018-06-03 NOTE — Patient Instructions (Signed)
Medication Instructions:  Your physician recommends that you continue on your current medications as directed. Please refer to the Current Medication list given to you today.   Labwork: NONE   Testing/Procedures: NONE   Follow-Up: Your physician recommends that you schedule a follow-up appointment with Dr. Ladona Ridgelaylor    Any Other Special Instructions Will Be Listed Below (If Applicable).     If you need a refill on your cardiac medications before your next appointment, please call your pharmacy.  Thank you for choosing [facility]!   Low-Sodium Eating Plan Sodium, which is an element that makes up salt, helps you maintain a healthy balance of fluids in your body. Too much sodium can increase your blood pressure and cause fluid and waste to be held in your body. Your health care provider or dietitian may recommend following this plan if you have high blood pressure (hypertension), kidney disease, liver disease, or heart failure. Eating less sodium can help lower your blood pressure, reduce swelling, and protect your heart, liver, and kidneys. What are tips for following this plan? General guidelines  Most people on this plan should limit their sodium intake to 1,500-2,000 mg (milligrams) of sodium each day. Reading food labels  The Nutrition Facts label lists the amount of sodium in one serving of the food. If you eat more than one serving, you must multiply the listed amount of sodium by the number of servings.  Choose foods with less than 140 mg of sodium per serving.  Avoid foods with 300 mg of sodium or more per serving. Shopping  Look for lower-sodium products, often labeled as "low-sodium" or "no salt added."  Always check the sodium content even if foods are labeled as "unsalted" or "no salt added".  Buy fresh foods. ? Avoid canned foods and premade or frozen meals. ? Avoid canned, cured, or processed meats  Buy breads that have less than 80 mg of sodium  per slice. Cooking  Eat more home-cooked food and less restaurant, buffet, and fast food.  Avoid adding salt when cooking. Use salt-free seasonings or herbs instead of table salt or sea salt. Check with your health care provider or pharmacist before using salt substitutes.  Cook with plant-based oils, such as canola, sunflower, or olive oil. Meal planning  When eating at a restaurant, ask that your food be prepared with less salt or no salt, if possible.  Avoid foods that contain MSG (monosodium glutamate). MSG is sometimes added to Cong[place]inese food, bouillon, and some canned foods. What foods are recommended? The items listed may not be a complete list. Talk with your dietitian about what dietary choices are best for you. Grains Low-sodium cereals, including oats, puffed wheat and rice, and shredded wheat. Low-sodium crackers. Unsalted rice. Unsalted pasta. Low-sodium bread. Whole-grain breads and whole-grain pasta. Vegetables Fresh or frozen vegetables. "No salt added" canned vegetables. "No salt added" tomato sauce and paste. Low-sodium or reduced-sodium tomato and vegetable juice. Fruits Fresh, frozen, or canned fruit. Fruit juice. Meats and other protein foods Fresh or frozen (no salt added) meat, poultry, seafood, and fish. Low-sodium canned tuna and salmon. Unsalted nuts. Dried peas, beans, and lentils without added salt. Unsalted canned beans. Eggs. Unsalted nut butters. Dairy Milk. Soy milk. Cheese that is naturally low in sodium, such as ricotta cheese, fresh mozzarella, or Swiss cheese Low-sodium or reduced-sodium cheese. Cream cheese. Yogurt. Fats and oils Unsalted butter. Unsalted margarine with no trans fat. Vegetable oils such as canola or olive oils. Seasonings and other  foods Fresh and dried herbs and spices. Salt-free seasonings. Low-sodium mustard and ketchup. Sodium-free salad dressing. Sodium-free light mayonnaise. Fresh or refrigerated horseradish. Lemon juice. Vinegar.  Homemade, reduced-sodium, or low-sodium soups. Unsalted popcorn and pretzels. Low-salt or salt-free chips. What foods are not recommended? The items listed may not be a complete list. Talk with your dietitian about what dietary choices are best for you. Grains Instant hot cereals. Bread stuffing, pancake, and biscuit mixes. Croutons. Seasoned rice or pasta mixes. Noodle soup cups. Boxed or frozen macaroni and cheese. Regular salted crackers. Self-rising flour. Vegetables Sauerkraut, pickled vegetables, and relishes. Olives. [place] fries. Onion rings. Regular canned vegetables (not low-sodium or reduced-sodium). Regular canned tomato sauce and paste (not low-sodium or reduced-sodium). Regular tomato and vegetable juice (not low-sodium or reduced-sodium). Frozen vegetables in sauces. Meats and other protein foods Meat or fish that is salted, canned, smoked, spiced, or pickled. Bacon, ham, sausage, hotdogs, corned beef, chipped beef, packaged lunch meats, salt pork, jerky, pickled herring, anchovies, regular canned tuna, sardines, salted nuts. Dairy Processed cheese and cheese spreads. Cheese curds. Blue cheese. Feta cheese. String cheese. Regular cottage cheese. Buttermilk. Canned milk. Fats and oils Salted butter. Regular margarine. Ghee. Bacon fat. Seasonings and other foods Onion salt, garlic salt, seasoned salt, table salt, and sea salt. Canned and packaged gravies. Worcestershire sauce. Tartar sauce. Barbecue sauce. Teriyaki sauce. Soy sauce, including reduced-sodium. Steak sauce. Fish sauce. Oyster sauce. Cocktail sauce. Horseradish that you find on the shelf. Regular ketchup and mustard. Meat flavorings and tenderizers. Bouillon cubes. Hot sauce and Tabasco sauce. Premade or packaged marinades. Premade or packaged taco seasonings. Relishes. Regular salad dressings. Salsa. Potato and tortilla chips. Corn chips and puffs. Salted popcorn and pretzels. Canned or dried soups. Pizza. Frozen entrees and  pot pies. Summary  Eating less sodium can help lower your blood pressure, reduce swelling, and protect your heart, liver, and kidneys.  Most people on this plan should limit their sodium intake to 1,500-2,000 mg (milligrams) of sodium each day.  Canned, boxed, and frozen foods are high in sodium. Restaurant foods, fast foods, and pizza are also very high in sodium. You also get sodium by adding salt to food.  Try to cook at home, eat more fresh fruits and vegetables, and eat less fast food, canned, processed, or prepared foods. This information is not intended to replace advice given to you by your health care provider. Make sure you discuss any questions you have with your health care provider. Document Released: 04/07/2002 Document Revised: 10/09/2016 Document Reviewed: 10/09/2016 Elsevier Interactive Patient Education  [employer].

## 2018-06-11 ENCOUNTER — Telehealth: Payer: Self-pay | Admitting: Internal Medicine

## 2018-06-11 ENCOUNTER — Other Ambulatory Visit: Payer: Self-pay

## 2018-06-11 ENCOUNTER — Inpatient Hospital Stay (HOSPITAL_COMMUNITY)
Admission: AD | Admit: 2018-06-11 | Discharge: 2018-06-14 | DRG: 482 | Disposition: A | Discharge status: Skilled Nursing Facility | Payer: Medicare Other | Source: Other Acute Inpatient Hospital | Attending: Family Medicine | Admitting: Family Medicine

## 2018-06-11 DIAGNOSIS — M109 Gout, unspecified: Secondary | ICD-10-CM | POA: Diagnosis present

## 2018-06-11 DIAGNOSIS — I129 Hypertensive chronic kidney disease with stage 1 through stage 4 chronic kidney disease, or unspecified chronic kidney disease: Secondary | ICD-10-CM | POA: Diagnosis present

## 2018-06-11 DIAGNOSIS — I493 Ventricular premature depolarization: Secondary | ICD-10-CM | POA: Diagnosis present

## 2018-06-11 DIAGNOSIS — Y92009 Unspecified place in unspecified non-institutional (private) residence as the place of occurrence of the external cause: Secondary | ICD-10-CM | POA: Diagnosis not present

## 2018-06-11 DIAGNOSIS — W1830XA Fall on same level, unspecified, initial encounter: Secondary | ICD-10-CM | POA: Diagnosis not present

## 2018-06-11 DIAGNOSIS — J449 Chronic obstructive pulmonary disease, unspecified: Secondary | ICD-10-CM | POA: Diagnosis present

## 2018-06-11 DIAGNOSIS — S72141A Displaced intertrochanteric fracture of right femur, initial encounter for closed fracture: Secondary | ICD-10-CM | POA: Diagnosis present

## 2018-06-11 DIAGNOSIS — N183 Chronic kidney disease, stage 3 unspecified: Secondary | ICD-10-CM | POA: Diagnosis present

## 2018-06-11 DIAGNOSIS — T148XXA Other injury of unspecified body region, initial encounter: Secondary | ICD-10-CM

## 2018-06-11 DIAGNOSIS — I10 Essential (primary) hypertension: Secondary | ICD-10-CM | POA: Diagnosis present

## 2018-06-11 DIAGNOSIS — Z419 Encounter for procedure for purposes other than remedying health state, unspecified: Secondary | ICD-10-CM

## 2018-06-11 DIAGNOSIS — Z09 Encounter for follow-up examination after completed treatment for conditions other than malignant neoplasm: Secondary | ICD-10-CM

## 2018-06-11 DIAGNOSIS — S72001A Fracture of unspecified part of neck of right femur, initial encounter for closed fracture: Secondary | ICD-10-CM | POA: Diagnosis not present

## 2018-06-11 DIAGNOSIS — R0609 Other forms of dyspnea: Secondary | ICD-10-CM

## 2018-06-11 NOTE — Progress Notes (Addendum)
84 yo female with Copd, hypertension, ckd stage3, apparently has mechanical fall and fracture  [facility] Everardo PacificVarkey [facility] and requested medicine admission  RN Please contact Flow manager when patient arrives to floor  [phone]

## 2018-06-12 ENCOUNTER — Inpatient Hospital Stay (HOSPITAL_COMMUNITY): Payer: Medicare Other

## 2018-06-12 ENCOUNTER — Encounter (HOSPITAL_COMMUNITY)
Admission: AD | Disposition: A | Discharge status: Skilled Nursing Facility | Payer: Self-pay | Source: Other Acute Inpatient Hospital | Attending: Family Medicine

## 2018-06-12 ENCOUNTER — Encounter (HOSPITAL_COMMUNITY): Payer: Self-pay | Admitting: Internal Medicine

## 2018-06-12 DIAGNOSIS — I10 Essential (primary) hypertension: Secondary | ICD-10-CM

## 2018-06-12 DIAGNOSIS — S72001A Fracture of unspecified part of neck of right femur, initial encounter for closed fracture: Secondary | ICD-10-CM | POA: Diagnosis present

## 2018-06-12 DIAGNOSIS — I493 Ventricular premature depolarization: Secondary | ICD-10-CM

## 2018-06-12 HISTORY — PX: INTRAMEDULLARY (IM) NAIL INTERTROCHANTERIC: SHX5875

## 2018-06-12 LAB — ABO/RH: ABO/RH(D): O POS

## 2018-06-12 LAB — CBC WITH DIFFERENTIAL/PLATELET
Abs Immature Granulocytes: 0.1 10*3/uL (ref 0.0–0.1)
BASOS PCT: 1 %
Basophils Absolute: 0.1 10*3/uL (ref 0.0–0.1)
EOS ABS: 0.1 10*3/uL (ref 0.0–0.7)
Eosinophils Relative: 0 %
HCT: 37.2 % (ref 36.0–46.0)
Hemoglobin: 12.3 g/dL (ref 12.0–15.0)
IMMATURE GRANULOCYTES: 1 %
Lymphocytes Relative: 22 %
Lymphs Abs: 2.7 10*3/uL (ref 0.7–4.0)
MCH: 32.9 pg (ref 26.0–34.0)
MCHC: 33.1 g/dL (ref 30.0–36.0)
MCV: 99.5 fL (ref 78.0–100.0)
Monocytes Absolute: 0.9 10*3/uL (ref 0.1–1.0)
Monocytes Relative: 7 %
NEUTROS PCT: 69 %
Neutro Abs: 8.5 10*3/uL — ABNORMAL HIGH (ref 1.7–7.7)
Platelets: 168 10*3/uL (ref 150–400)
RBC: 3.74 MIL/uL — AB (ref 3.87–5.11)
RDW: 13.8 % (ref 11.5–15.5)
WBC: 12.3 10*3/uL — AB (ref 4.0–10.5)

## 2018-06-12 LAB — TYPE AND SCREEN
ABO/RH(D): O POS
ANTIBODY SCREEN: NEGATIVE

## 2018-06-12 LAB — COMPREHENSIVE METABOLIC PANEL
ALBUMIN: 3.1 g/dL — AB (ref 3.5–5.0)
ALT: 17 U/L (ref 0–44)
AST: 26 U/L (ref 15–41)
Alkaline Phosphatase: 65 U/L (ref 38–126)
Anion gap: 8 (ref 5–15)
BUN: 18 mg/dL (ref 8–23)
CHLORIDE: 100 mmol/L (ref 98–111)
CO2: 28 mmol/L (ref 22–32)
Calcium: 9 mg/dL (ref 8.9–10.3)
Creatinine, Ser: 1.18 mg/dL — ABNORMAL HIGH (ref 0.44–1.00)
GFR calc Af Amer: 48 mL/min — ABNORMAL LOW (ref >60)
GFR calc non Af Amer: 42 mL/min — ABNORMAL LOW (ref >60)
GLUCOSE: 114 mg/dL — AB (ref 70–99)
POTASSIUM: 4.4 mmol/L (ref 3.5–5.1)
SODIUM: 136 mmol/L (ref 135–145)
Total Bilirubin: 1 mg/dL (ref 0.3–1.2)
Total Protein: 6.4 g/dL — ABNORMAL LOW (ref 6.5–8.1)

## 2018-06-12 LAB — PROTIME-INR
INR: 1.45
PROTHROMBIN TIME: 17.5 s — AB (ref 11.4–15.2)

## 2018-06-12 LAB — MRSA PCR SCREENING: MRSA BY PCR: NEGATIVE

## 2018-06-12 SURGERY — FIXATION, FRACTURE, INTERTROCHANTERIC, WITH INTRAMEDULLARY ROD
Anesthesia: General | Laterality: Right

## 2018-06-12 MED ORDER — SUGAMMADEX SODIUM 200 MG/2ML IV SOLN
INTRAVENOUS | Status: DC | PRN
  Administered 2018-06-12: 260 mg via INTRAVENOUS

## 2018-06-12 MED ORDER — OXYCODONE HCL 5 MG PO TABS
5.0000 mg | ORAL_TABLET | ORAL | Status: DC | PRN
  Administered 2018-06-14 (×2): 5 mg via ORAL
  Filled 2018-06-12 (×2): qty 1

## 2018-06-12 MED ORDER — ONDANSETRON HCL 4 MG/2ML IJ SOLN
INTRAMUSCULAR | Status: AC
  Filled 2018-06-12: qty 2

## 2018-06-12 MED ORDER — METOCLOPRAMIDE HCL 5 MG/ML IJ SOLN: 5.0000 mg | Freq: Three times a day (TID) | INTRAMUSCULAR | Status: DC | PRN

## 2018-06-12 MED ORDER — ROCURONIUM BROMIDE 100 MG/10ML IV SOLN
INTRAVENOUS | Status: DC | PRN
  Administered 2018-06-12: 50 mg via INTRAVENOUS

## 2018-06-12 MED ORDER — CHLORHEXIDINE GLUCONATE 4 % EX LIQD
60.0000 mL | Freq: Once | CUTANEOUS | Status: AC
  Administered 2018-06-12: 4 via TOPICAL
  Filled 2018-06-12: qty 60

## 2018-06-12 MED ORDER — LACTATED RINGERS IV SOLN
INTRAVENOUS | Status: DC
  Administered 2018-06-12: 12:00:00 via INTRAVENOUS

## 2018-06-12 MED ORDER — METOCLOPRAMIDE HCL 5 MG PO TABS: 5.0000 mg | ORAL_TABLET | Freq: Three times a day (TID) | ORAL | Status: DC | PRN

## 2018-06-12 MED ORDER — DEXTROSE 5 % IV SOLN
500.0000 mg | Freq: Three times a day (TID) | INTRAVENOUS | Status: DC | PRN
  Administered 2018-06-12: 500 mg via INTRAVENOUS
  Filled 2018-06-12: qty 5

## 2018-06-12 MED ORDER — SODIUM CHLORIDE 0.9 % IV SOLN
INTRAVENOUS | Status: DC | PRN
  Administered 2018-06-12: 25 ug/min via INTRAVENOUS

## 2018-06-12 MED ORDER — ENOXAPARIN SODIUM 40 MG/0.4ML ~~LOC~~ SOLN
40.0000 mg | SUBCUTANEOUS | Status: DC
  Administered 2018-06-13 – 2018-06-14 (×2): 40 mg via SUBCUTANEOUS
  Filled 2018-06-12 (×2): qty 0.4

## 2018-06-12 MED ORDER — ONDANSETRON HCL 4 MG/2ML IJ SOLN
INTRAMUSCULAR | Status: DC | PRN
  Administered 2018-06-12: 4 mg via INTRAVENOUS

## 2018-06-12 MED ORDER — PROPOFOL 10 MG/ML IV BOLUS
INTRAVENOUS | Status: DC | PRN
  Administered 2018-06-12: 20 mg via INTRAVENOUS
  Administered 2018-06-12: 50 mg via INTRAVENOUS

## 2018-06-12 MED ORDER — ROCURONIUM BROMIDE 10 MG/ML (PF) SYRINGE
PREFILLED_SYRINGE | INTRAVENOUS | Status: AC
  Filled 2018-06-12: qty 10

## 2018-06-12 MED ORDER — ACETAMINOPHEN 325 MG PO TABS
325.0000 mg | ORAL_TABLET | Freq: Four times a day (QID) | ORAL | Status: DC | PRN
  Administered 2018-06-13: 650 mg via ORAL
  Filled 2018-06-12: qty 2

## 2018-06-12 MED ORDER — FENTANYL CITRATE (PF) 250 MCG/5ML IJ SOLN
INTRAMUSCULAR | Status: AC
  Filled 2018-06-12: qty 5

## 2018-06-12 MED ORDER — CEFAZOLIN SODIUM-DEXTROSE 2-4 GM/100ML-% IV SOLN
2.0000 g | Freq: Four times a day (QID) | INTRAVENOUS | Status: AC
  Administered 2018-06-12 – 2018-06-13 (×2): 2 g via INTRAVENOUS
  Filled 2018-06-12 (×2): qty 100

## 2018-06-12 MED ORDER — LIDOCAINE HCL (CARDIAC) PF 100 MG/5ML IV SOSY
PREFILLED_SYRINGE | INTRAVENOUS | Status: DC | PRN
  Administered 2018-06-12: 60 mg via INTRAVENOUS

## 2018-06-12 MED ORDER — EPHEDRINE SULFATE-NACL 50-0.9 MG/10ML-% IV SOSY
PREFILLED_SYRINGE | INTRAVENOUS | Status: DC | PRN
  Administered 2018-06-12 (×2): 5 mg via INTRAVENOUS
  Administered 2018-06-12: 15 mg via INTRAVENOUS
  Administered 2018-06-12: 10 mg via INTRAVENOUS

## 2018-06-12 MED ORDER — FENTANYL CITRATE (PF) 250 MCG/5ML IJ SOLN
INTRAMUSCULAR | Status: DC | PRN
  Administered 2018-06-12: 75 ug via INTRAVENOUS
  Administered 2018-06-12 (×2): 25 ug via INTRAVENOUS

## 2018-06-12 MED ORDER — EPHEDRINE 5 MG/ML INJ
INTRAVENOUS | Status: AC
  Filled 2018-06-12: qty 10

## 2018-06-12 MED ORDER — MORPHINE SULFATE (PF) 2 MG/ML IV SOLN
0.5000 mg | INTRAVENOUS | Status: DC | PRN
  Administered 2018-06-12 (×4): 0.5 mg via INTRAVENOUS
  Filled 2018-06-12 (×5): qty 1

## 2018-06-12 MED ORDER — ACETAMINOPHEN 500 MG PO TABS
1000.0000 mg | ORAL_TABLET | Freq: Four times a day (QID) | ORAL | Status: AC
  Administered 2018-06-12 – 2018-06-13 (×4): 1000 mg via ORAL
  Filled 2018-06-12 (×4): qty 2

## 2018-06-12 MED ORDER — MENTHOL 3 MG MT LOZG: 1.0000 | LOZENGE | OROMUCOSAL | Status: DC | PRN

## 2018-06-12 MED ORDER — ONDANSETRON HCL 4 MG/2ML IJ SOLN: 4.0000 mg | Freq: Four times a day (QID) | INTRAMUSCULAR | Status: DC | PRN

## 2018-06-12 MED ORDER — CEFAZOLIN SODIUM-DEXTROSE 2-4 GM/100ML-% IV SOLN
2.0000 g | INTRAVENOUS | Status: AC
  Administered 2018-06-12: 2 g via INTRAVENOUS
  Filled 2018-06-12: qty 100

## 2018-06-12 MED ORDER — DOCUSATE SODIUM 100 MG PO CAPS
100.0000 mg | ORAL_CAPSULE | Freq: Two times a day (BID) | ORAL | Status: DC
  Administered 2018-06-12 – 2018-06-13 (×2): 100 mg via ORAL
  Filled 2018-06-12 (×2): qty 1

## 2018-06-12 MED ORDER — ONDANSETRON HCL 4 MG PO TABS
4.0000 mg | ORAL_TABLET | Freq: Four times a day (QID) | ORAL | Status: DC | PRN
  Administered 2018-06-13: 4 mg via ORAL
  Filled 2018-06-12: qty 1

## 2018-06-12 MED ORDER — SUGAMMADEX SODIUM 500 MG/5ML IV SOLN
INTRAVENOUS | Status: AC
  Filled 2018-06-12: qty 5

## 2018-06-12 MED ORDER — ATENOLOL 25 MG PO TABS
12.5000 mg | ORAL_TABLET | Freq: Every day | ORAL | Status: DC
  Administered 2018-06-12 – 2018-06-13 (×2): 12.5 mg via ORAL
  Filled 2018-06-12 (×2): qty 0.5

## 2018-06-12 MED ORDER — HYDROMORPHONE HCL 1 MG/ML IJ SOLN: 0.5000 mg | INTRAMUSCULAR | Status: DC | PRN

## 2018-06-12 MED ORDER — OXYCODONE HCL 5 MG PO TABS: 5.0000 mg | ORAL_TABLET | Freq: Once | ORAL | Status: DC | PRN

## 2018-06-12 MED ORDER — OXYCODONE HCL 5 MG/5ML PO SOLN: 5.0000 mg | Freq: Once | ORAL | Status: DC | PRN

## 2018-06-12 MED ORDER — LIDOCAINE 2% (20 MG/ML) 5 ML SYRINGE
INTRAMUSCULAR | Status: AC
  Filled 2018-06-12: qty 5

## 2018-06-12 MED ORDER — PHENOL 1.4 % MT LIQD: 1.0000 | OROMUCOSAL | Status: DC | PRN

## 2018-06-12 MED ORDER — PROMETHAZINE HCL 25 MG/ML IJ SOLN: 6.2500 mg | INTRAMUSCULAR | Status: DC | PRN

## 2018-06-12 MED ORDER — HYDROMORPHONE HCL 1 MG/ML IJ SOLN: 0.2500 mg | INTRAMUSCULAR | Status: DC | PRN

## 2018-06-12 SURGICAL SUPPLY — 41 items
BIT DRILL 4.3MMS DISTAL GRDTED (BIT) ×1 IMPLANT
BNDG COHESIVE 4X5 TAN STRL (GAUZE/BANDAGES/DRESSINGS) ×6 IMPLANT
BNDG GAUZE ELAST 4 BULKY (GAUZE/BANDAGES/DRESSINGS) IMPLANT
CLOSURE STERI-STRIP 1/2X4 (GAUZE/BANDAGES/DRESSINGS) ×1
CLSR STERI-STRIP ANTIMIC 1/2X4 (GAUZE/BANDAGES/DRESSINGS) ×2 IMPLANT
COVER PERINEAL POST (MISCELLANEOUS) ×3 IMPLANT
COVER SURGICAL LIGHT HANDLE (MISCELLANEOUS) ×3 IMPLANT
DRAPE STERI IOBAN 125X83 (DRAPES) ×6 IMPLANT
DRESSING AQUACEL AG SP 3.5X4 (GAUZE/BANDAGES/DRESSINGS) ×1 IMPLANT
DRILL 4.3MMS DISTAL GRADUATED (BIT) ×3
DRSG AQUACEL AG SP 3.5X4 (GAUZE/BANDAGES/DRESSINGS) ×3
DRSG MEPILEX BORDER 4X4 (GAUZE/BANDAGES/DRESSINGS) ×9 IMPLANT
DURAPREP 26ML APPLICATOR (WOUND CARE) ×3 IMPLANT
ELECT REM PT RETURN 9FT ADLT (ELECTROSURGICAL) ×3
ELECTRODE REM PT RTRN 9FT ADLT (ELECTROSURGICAL) ×1 IMPLANT
GLOVE BIOGEL PI IND STRL 8 (GLOVE) ×1 IMPLANT
GLOVE BIOGEL PI INDICATOR 8 (GLOVE) ×2
GLOVE BIOGEL PI ORTHO PRO SZ8 (GLOVE)
GLOVE ECLIPSE 8.0 STRL XLNG CF (GLOVE) IMPLANT
GLOVE PI ORTHO PRO STRL SZ8 (GLOVE) IMPLANT
GLOVE SURG ORTHO 8.0 STRL STRW (GLOVE) IMPLANT
GOWN STRL REUS W/ TWL LRG LVL3 (GOWN DISPOSABLE) ×2 IMPLANT
GOWN STRL REUS W/TWL 2XL LVL3 (GOWN DISPOSABLE) IMPLANT
GOWN STRL REUS W/TWL LRG LVL3 (GOWN DISPOSABLE) ×4
GUIDEWIRE BALL NOSE 100CM (WIRE) ×3 IMPLANT
HIP FRAC NAIL LAG SCR 10.5X100 (Orthopedic Implant) ×2 IMPLANT
KIT TURNOVER KIT B (KITS) ×3 IMPLANT
MANIFOLD NEPTUNE II (INSTRUMENTS) IMPLANT
NAIL HIP FRACTURE 11X380MM (Nail) ×3 IMPLANT
NS IRRIG 1000ML POUR BTL (IV SOLUTION) ×3 IMPLANT
PACK GENERAL/GYN (CUSTOM PROCEDURE TRAY) ×3 IMPLANT
PAD ARMBOARD 7.5X6 YLW CONV (MISCELLANEOUS) ×6 IMPLANT
SCREW BONE CORTICAL 5.0X42 (Screw) ×3 IMPLANT
SCREW CANN THRD AFF 10.5X100 (Orthopedic Implant) ×1 IMPLANT
SUT MNCRL AB 4-0 PS2 18 (SUTURE) ×3 IMPLANT
SUT VIC AB 0 CT1 27 (SUTURE) ×2
SUT VIC AB 0 CT1 27XBRD ANBCTR (SUTURE) ×1 IMPLANT
SUT VIC AB 2-0 CT1 27 (SUTURE) ×2
SUT VIC AB 2-0 CT1 TAPERPNT 27 (SUTURE) ×1 IMPLANT
TOWEL OR 17X24 6PK STRL BLUE (TOWEL DISPOSABLE) ×3 IMPLANT
TOWEL OR 17X26 10 PK STRL BLUE (TOWEL DISPOSABLE) ×3 IMPLANT

## 2018-06-12 NOTE — Progress Notes (Signed)
Patient seen and evaluated, chart reviewed, please see EMR for updated orders. Please see full H&P dictated by admitting physician Dr Toniann FailKakrakandy for same date of service.    84 y.o. female with history of hypertension, dyspnea on exertion attributed to possibly secondary to PVCs, chronic kidney disease stage III had a fall at home [facility] [place] [facility] further orthopedic evaluation  Going to  OR on 06/12/2018 for ORIF  C/o muscle cramps, will give methocarbamol, discussed with  RN [place]   Patient seen and evaluated, chart reviewed, please see EMR for updated orders. Please see full H&P dictated by admitting physician Dr Toniann FailKakrakandy for same date of service.

## 2018-06-12 NOTE — Plan of Care (Signed)

## 2018-06-12 NOTE — Transfer of Care (Signed)
Immediate Anesthesia Transfer of Care Note  Patient: Rhonda Spears  Procedure(s) Performed: INTRAMEDULLARY (IM) NAIL INTERTROCHANTRIC (Right )  Patient Location: PACU  Anesthesia Type:General  Level of Consciousness: awake, alert , oriented, drowsy and patient cooperative  Airway & Oxygen Therapy: Patient Spontanous Breathing and Patient connected to face mask oxygen  Post-op Assessment: Report given to RN and Post -op Vital signs reviewed and stable  Post vital signs: Reviewed and stable  Last Vitals:  Vitals Value Taken Time  BP 96/44 06/12/2018  2:36 PM  Temp    Pulse 29 06/12/2018  2:39 PM  Resp 15 06/12/2018  2:39 PM  SpO2 100 % 06/12/2018  2:39 PM  Vitals shown include unvalidated device data.  Last Pain:  Vitals:   06/12/18 1002  TempSrc:   PainSc: 5       Patients Stated Pain Goal: 2 (06/12/18 0951)  Complications: No apparent anesthesia complications

## 2018-06-12 NOTE — Op Note (Signed)
Orthopaedic Surgery Operative Note (CSN: [id number])  Rhonda Spears  04/12/1934 Date of Surgery: 06/11/2018 - 06/12/2018   Diagnoses:  right intertroch femur fracture  Procedure: 27245 - IMN for intertrochanteric femur fracture   Operative Finding Successful completion of planned procedure.  Good purchase of bone.  Anatomic reduction.  Post-operative plan: The patient will be WBAT.  The patient will be readmitted.  DVT prophylaxis Lovenox 40mg  qd.  Pain control with PRN pain medication preferring oral medicines.  Follow up plan will be scheduled in approximately 14 days for incision check and XR.  Post-Op Diagnosis: Same Surgeons:Primary: Bjorn PippinVarkey, Eulan Heyward T, MD Assistants:Brandon Marcell BarlowParry OPAC [facility] Anesthesia: General Antibiotics: Ancef 2g preop Tourniquet time: * No tourniquets in log * Estimated Blood Loss: 100 Complications: None Specimens: None Implants: Implant Name Type Inv. Item Serial No. Manufacturer Lot No. LRB No. Used Action  NAIL HIP FRACTURE [id number] - [id number] Nail NAIL HIP FRACTURE 11X380MM  [employer](ORTH,TRAU,BIO,SG) [id number] Right 1 Implanted  HIP FRAC NAIL LAG SCR 10.5X100 - [id number] Orthopedic Implant HIP FRAC NAIL LAG SCR 10.5X100  [employer](ORTH,TRAU,BIO,SG) [id number] Right 1 Implanted  SCREW BONE CORTICAL 5.0X42 - [id number] Screw SCREW BONE CORTICAL 5.0X42  [employer](ORTH,TRAU,BIO,SG) [id number] Right 1 Implanted    Indications for Surgery:   Rhonda Spears is a 84 y.o. female with fall resulting in displaced intertrochateric femur fracture.  Benefits and risks of operative and nonoperative management were discussed prior to surgery with patient/guardian(s) and informed consent form was completed.  Specific risks including infection, need for additional surgery, loss of fixation, continued pain.   Procedure:   The patient was identified in the preoperative holding area where the surgical site was marked. The patient was taken to the  OR where a procedural timeout was called and the above noted anesthesia was induced.  The patient was positioned supine on fracture table.  Preoperative antibiotics were dosed.  The patient's right femur was prepped and draped in the usual sterile fashion.  A second preoperative timeout was called.      The patient was placed supine on a fracture table and appropriate reduction was obtained and visualized on fluoroscopy prior to the beginning of the procedure.  We made an incision proximal to the greater trochanter and dissected down through the fascia.  We then carefully placed our starting awl localizing under fluoroscopy prior to advancing the awl into the bone and sliding a ball-tipped guidewire through the awl into the femoral canal.  The wire was passed to an appropriate level at the fascial scar of the distal femur and measurement was obtained proximally using fluoroscopy.  We selected a length of nail noted above.  Entry reamer was used needed and we reamed to 12.5 due to tight canal..  At this point we placed our nail localizing under fluoroscopy that it was at the appropriate level prior to using the outrigger device to pass a wire and then the cephalo-medullary screw.  We placed a derotational wire to avoid spinning the neck fragment.  The screw was locked proximally to avoid over collapse.  We took final shots at the proximal femur and then used perfect circle technique to place one distal interlock screw.  Final pictures were obtained.  The wounds were thoroughly irrigated closed in a multilayer fashion with absorable sutures.  A sterile dressing was placed.  The patient was awoken from general anesthesia and taken to the PACU in stable condition without complication.    Janace LittenBrandon Parry, OPA-C,  present and scrubbed throughout the case, critical for completion in a timely fashion, and for retraction, instrumentation, closure.

## 2018-06-12 NOTE — Progress Notes (Addendum)
Nutrition Consult/Brief Note  RD consulted via hip fracture protocol. Pt currently in OR for intramedullary nail surgery. Will complete nutrition assessment at later date.    Maureen ChattersKatie Keyler Hoge, RD, LDN Pager #: [phone] After-Hours Pager #: [phone]

## 2018-06-12 NOTE — Anesthesia Preprocedure Evaluation (Addendum)
Anesthesia Evaluation  Patient identified by MRN, date of birth, ID band Patient awake    Reviewed: Allergy & Precautions, NPO status , Patient's Chart, lab work & pertinent test results, reviewed documented beta blocker date and time   Airway Mallampati: II  TM Distance: >3 FB Neck ROM: Full    Dental no notable dental hx. (+) Edentulous Upper, Edentulous Lower   Pulmonary neg pulmonary ROS, COPD,    Pulmonary exam normal breath sounds clear to auscultation       Cardiovascular hypertension, Pt. on medications and Pt. on home beta blockers negative cardio ROS Normal cardiovascular exam Rhythm:Regular Rate:Normal     Neuro/Psych  Headaches, negative neurological ROS  negative psych ROS   GI/Hepatic negative GI ROS, Neg liver ROS,   Endo/Other  negative endocrine ROS  Renal/GU Renal InsufficiencyRenal diseasenegative Renal ROS  negative genitourinary   Musculoskeletal negative musculoskeletal ROS (+) Arthritis , Osteoarthritis,    Abdominal   Peds negative pediatric ROS (+)  Hematology negative hematology ROS (+)   Anesthesia Other Findings   Reproductive/Obstetrics negative OB ROS                            Anesthesia Physical Anesthesia Plan  ASA: II  Anesthesia Plan: General   Post-op Pain Management:    Induction: Intravenous  PONV Risk Score and Plan: 3 and Ondansetron, Dexamethasone, Midazolam and Treatment may vary due to age or medical condition  Airway Management Planned: Oral ETT  Additional Equipment:   Intra-op Plan:   Post-operative Plan: Extubation in OR  Informed Consent: I have reviewed the patients History and Physical, chart, labs and discussed the procedure including the risks, benefits and alternatives for the proposed anesthesia with the patient or authorized representative who has indicated his/her understanding and acceptance.   Dental advisory  given  Plan Discussed with: CRNA  Anesthesia Plan Comments:         Anesthesia Quick Evaluation

## 2018-06-12 NOTE — Anesthesia Procedure Notes (Signed)
Procedure Name: Intubation Date/Time: 06/12/2018 1:12 PM Performed by: Raenette Rover, CRNA Pre-anesthesia Checklist: Patient identified, Emergency Drugs available, Suction available, Patient being monitored and Timeout performed Patient Re-evaluated:Patient Re-evaluated prior to induction Oxygen Delivery Method: Circle system utilized Preoxygenation: Pre-oxygenation with 100% oxygen Induction Type: IV induction Ventilation: Mask ventilation without difficulty Laryngoscope Size: Mac and 3 Grade View: Grade I Tube type: Oral Tube size: 7.0 mm Number of attempts: 1 Placement Confirmation: ETT inserted through vocal cords under direct vision,  positive ETCO2,  CO2 detector and breath sounds checked- equal and bilateral Secured at: 22 cm Tube secured with: Tape Dental Injury: Teeth and Oropharynx as per pre-operative assessment  Comments: ETT placed by Violet Baldy

## 2018-06-12 NOTE — Plan of Care (Signed)
  Problem: Coping: Goal: Level of anxiety will decrease Outcome: Progressing   Problem: Pain Managment: Goal: General experience of comfort will improve Outcome: Progressing   

## 2018-06-12 NOTE — Anesthesia Postprocedure Evaluation (Signed)
Anesthesia Post Note  Patient: Rhonda Spears  Procedure(s) Performed: INTRAMEDULLARY (IM) NAIL INTERTROCHANTRIC (Right )     Patient location during evaluation: PACU Anesthesia Type: General Level of consciousness: awake and alert Pain management: pain level controlled Vital Signs Assessment: post-procedure vital signs reviewed and stable Respiratory status: spontaneous breathing, nonlabored ventilation and respiratory function stable Cardiovascular status: blood pressure returned to baseline and stable Postop Assessment: no apparent nausea or vomiting Anesthetic complications: no    Last Vitals:  Vitals:   06/12/18 1545 06/12/18 1600  BP: (!) 96/44 (!) 95/46  Pulse:    Resp: 18 14  Temp:    SpO2:  100%    Last Pain:  Vitals:   06/12/18 1600  TempSrc:   PainSc: 0-No pain                 Lowella CurbWarren Ray Siriah Treat

## 2018-06-12 NOTE — Progress Notes (Signed)
Called and gave report to short stay, patient is ready for transfer

## 2018-06-12 NOTE — Consult Note (Signed)
ORTHOPAEDIC CONSULTATION  REQUESTING PHYSICIAN: Roxan Hockey, MD  Chief Complaint: R hip fracture  HPI: Rhonda Spears is a 84 y.o. female with  acute traumatic fall from standing resulting in R hip pain and inability to ambulate.  No LOC, at baseline state of health prior to fall.  Ambulated without device prior to fall.  She reports a hip fracture on the left side managed 2-1/2 years ago she did well from this.  Reports significant pain at site of injury, no other areas of pain reported acutely.    Past Medical History:  Diagnosis Date  . Chest pain with moderate risk for cardiac etiology 03/30/2017  . CKD (chronic kidney disease), stage III ([place])   . COPD (chronic obstructive pulmonary disease) ([place])   . Gout   . Hypertension   . PVC's (premature ventricular contractions)   . Renal disorder   . Sinus bradycardia   . Tricuspid regurgitation   . Venous insufficiency    Past Surgical History:  Procedure Laterality Date  . arthroscopic left knee    . repair left femoral neck    . right medial men     Social History   Socioeconomic History  . Marital status: Divorced    Spouse name: Not on file  . Number of children: Not on file  . Years of education: Not on file  . Highest education level: Not on file  Occupational History  . Occupation: Retired  Scientific laboratory technician  . Financial resource strain: Not on file  . Food insecurity:    Worry: Not on file    Inability: Not on file  . Transportation needs:    Medical: Not on file    Non-medical: Not on file  Tobacco Use  . Smoking status: Never Smoker  . Smokeless tobacco: Never Used  Substance and Sexual Activity  . Alcohol use: No  . Drug use: No  . Sexual activity: Not on file  Lifestyle  . Physical activity:    Days per week: Not on file    Minutes per session: Not on file  . Stress: Not on file  Relationships  . Social connections:    Talks on phone: Not on file    Gets together: Not on file    Attends  religious service: Not on file    Active member of club or organization: Not on file    Attends meetings of clubs or organizations: Not on file    Relationship status: Not on file  Other Topics Concern  . Not on file  Social History Narrative   Pt lives alone in [place], [place]   Family History  Problem Relation Age of Onset  . Heart attack Mother   . Hypertension Mother   . Early death Father 15       Car accident   Allergies  Allergen Reactions  . Latex Itching  . Omeprazole Palpitations  . Tramadol Itching and Nausea Only  . Lasix [Furosemide] Other (See Comments)    SEVERE HEADACHE  . Sulfa Antibiotics Nausea Only and Other (See Comments)    Severe headaches  . Zanaflex [Tizanidine Hcl] Itching and Other (See Comments)    oversedation   Prior to Admission medications   Medication Sig Start Date End Date Taking? Authorizing Provider  allopurinol (ZYLOPRIM) 100 MG tablet Take 50 mg by mouth at bedtime.     [provider]  amitriptyline (ELAVIL) 10 MG tablet Take 5 mg by mouth at bedtime.  [provider]  atenolol (TENORMIN) 25 MG tablet Take 1 tablet (25 mg total) by mouth daily. Patient taking differently: Take 12.5 mg by mouth every morning.  04/01/17   Mikhail, Velta Addison, DO  Calcium Carb-Cholecalciferol (CALCIUM 600+D) 600-800 MG-UNIT TABS Take 1 tablet by mouth every morning.     [provider]  cholecalciferol (VITAMIN D) 1000 units tablet Take 1,000 Units by mouth every morning.     [provider]  docusate sodium (COLACE) 100 MG capsule Take 100 mg by mouth daily as needed for mild constipation or moderate constipation.     [provider]  folic acid (FOLVITE) 583 MCG tablet Take 400 mcg by mouth at bedtime.    [provider]  HYDROcodone-acetaminophen (NORCO/VICODIN) 5-325 MG tablet Take 1 tablet by mouth every 6 (six) hours as needed for moderate pain.    [provider]  Magnesium 250 MG TABS Take 1  tablet by mouth daily.    [provider]  Multiple Vitamin (MULTIVITAMIN) capsule Take 1 capsule by mouth daily.    [provider]   No results found. Family History Reviewed and non-contributory, no pertinent history of problems with bleeding or anesthesia      Review of Systems 14 system ROS conducted and negative except for that noted in HPI   OBJECTIVE  Vitals: Patient Vitals for the past 8 hrs:  BP Temp Temp src Pulse Resp SpO2  06/12/18 0518 106/62 98.5 F (36.9 C) Oral 60 14 99 %   General: Alert, no acute distress Cardiovascular: No pedal edema Respiratory: No cyanosis, no use of accessory musculature GI: No organomegaly, abdomen is soft and non-tender Skin: No lesions in the area of chief complaint other than those listed below in MSK exam.  Neurologic: Sensation intact distally save for the below mentioned MSK exam Psychiatric: Patient is competent for consent with normal mood and affect Lymphatic: No axillary or cervical lymphadenopathy Extremities  RLE: Shortened and externally rotated.  ROM deferred. + GS/TA/EHL. Sensation intact in DP/SP/S/S/P distributions. 2+ DP pulse with warm and well perfused digits. Compartments soft and compressible, with no pain on passive stretch.     Test Results Imaging Trace demonstrate a intertrochanteric femur fracture on the right with lesser trochanter involvement  Labs cbc Recent Labs    06/12/18 0220  WBC 12.3*  HGB 12.3  HCT 37.2  PLT 168    Labs inflam No results for input(s): CRP in the last 72 hours.  Invalid input(s): ESR  Labs coag Recent Labs    06/12/18 0220  INR 1.45    Recent Labs    06/12/18 0220  NA 136  K 4.4  CL 100  CO2 28  GLUCOSE 114*  BUN 18  CREATININE 1.18*  CALCIUM 9.0     ASSESSMENT AND PLAN: 84 y.o. female with the following: Right intertrochanteric femur fracture  Discussed the nature of the injury as well as the care with the patient as well as the  family.  Nonoperative measures are not well tolerated as patient's on bedrest for extended periods of time tend to develop secondary issues such as pneumonia, urinary tract infections, bedsores and delirium.  Based on this our recommendation is for operative measures.  The risks benefits and alternatives were discussed with the patient including but not limited to the risks of nonoperative treatment, versus surgical intervention including infection, bleeding, nerve injury, periprosthetic fracture, the need for revision surgery, leg length discrepancy, gait change, blood clots, cardiopulmonary complications, morbidity,  mortality, among others, and they were willing to proceed.    Plan for surgery today pending schedule availability

## 2018-06-12 NOTE — H&P (Signed)
History and Physical    FLYNN LININGER ZOX:[id number] DOB: 04/24/1934 DOA: 06/11/2018  PCP: Kela Millin, MD  Patient coming from: Patient was transferred from [facility].  Chief Complaint: Fall.  HPI: Rhonda Spears is a 84 y.o. female with history of hypertension, dyspnea on exertion attributed to possibly secondary to PVCs, chronic kidney disease stage III had a fall at home while changing her pants.  Denies hitting her head or losing consciousness.  Denies any chest pain or shortness of breath.  After the fall patient started having right hip pain and was taken to ER at [facility].  X-rays revealed right hip fracture and on-call orthopedic surgeon at [facility] was consulted.  Patient transferred for further work-up.  On my exam patient is not in any acute distress.  I have reviewed patient's labs done at [facility] and chest x-rays and EKG.  Chest x-ray shows some chronic changes.  Labs are largely unremarkable.  ED Course: Patient was a direct admit.  Review of Systems: As per HPI, rest all negative.   Past Medical History:  Diagnosis Date  . Chest pain with moderate risk for cardiac etiology 03/30/2017  . CKD (chronic kidney disease), stage III (HCC)   . COPD (chronic obstructive pulmonary disease) (HCC)   . Gout   . Hypertension   . PVC's (premature ventricular contractions)   . Renal disorder   . Sinus bradycardia   . Tricuspid regurgitation   . Venous insufficiency     Past Surgical History:  Procedure Laterality Date  . arthroscopic left knee    . repair left femoral neck    . right medial men       reports that she has never smoked. She has never used smokeless tobacco. She reports that she does not drink alcohol or use drugs.  Allergies  Allergen Reactions  . Latex Itching  . Omeprazole Palpitations  . Tramadol Itching and Nausea Only  . Lasix [Furosemide] Other (See Comments)    SEVERE HEADACHE  . Sulfa Antibiotics Nausea Only and Other (See Comments)    Severe headaches  . Zanaflex [Tizanidine Hcl] Itching and Other (See Comments)    oversedation    Family History  Problem Relation Age of Onset  . Heart attack Mother   . Hypertension Mother   . Early death Father 29       Car accident    Prior to Admission medications   Medication Sig Start Date End Date Taking? Authorizing Provider  allopurinol (ZYLOPRIM) 100 MG tablet Take 50 mg by mouth at bedtime.     [provider]  amitriptyline (ELAVIL) 10 MG tablet Take 5 mg by mouth at bedtime.     [provider]  atenolol (TENORMIN) 25 MG tablet Take 1 tablet (25 mg total) by mouth daily. Patient taking differently: Take 12.5 mg by mouth every morning.  04/01/17   Mikhail, Nita Sells, DO  Calcium Carb-Cholecalciferol (CALCIUM 600+D) 600-800 MG-UNIT TABS Take 1 tablet by mouth every morning.     [provider]  cholecalciferol (VITAMIN D) 1000 units tablet Take 1,000 Units by mouth every morning.     [provider]  docusate sodium (COLACE) 100 MG capsule Take 100 mg by mouth daily as needed for mild constipation or moderate constipation.     [provider]  folic acid (FOLVITE) 400 MCG tablet Take 400 mcg by mouth at bedtime.    [provider]  HYDROcodone-acetaminophen (NORCO/VICODIN) 5-325 MG tablet  Take 1 tablet by mouth every 6 (six) hours as needed for moderate pain.    [provider]  Magnesium 250 MG TABS Take 1 tablet by mouth daily.    [provider]  Multiple Vitamin (MULTIVITAMIN) capsule Take 1 capsule by mouth daily.    [provider]    Physical Exam: Vitals:   06/11/18 2245 06/11/18 2316 06/11/18 2317  BP: 112/70    Pulse: 60    Resp: 16    Temp: 98 F (36.7 C)    TempSrc: Oral    SpO2: 100%    Weight:   65.5 kg  Height:  5\' 1"  (1.549 m)       Constitutional: Moderately built and nourished. Vitals:   06/11/18 2245 06/11/18 2316 06/11/18 2317  BP: 112/70    Pulse: 60    Resp:  16    Temp: 98 F (36.7 C)    TempSrc: Oral    SpO2: 100%    Weight:   65.5 kg  Height:  5\' 1"  (1.549 m)    Eyes: Anicteric no pallor. ENMT: No discharge from the ears eyes nose or mouth. Neck: No mass felt.  No JVD appreciated. Respiratory: No rhonchi or crepitations. Cardiovascular: S1-S2 heard no murmurs appreciated. Abdomen: Soft nontender bowel sounds present. Musculoskeletal: Pain on moving right hip. Skin: No rash. Neurologic: Alert awake oriented to time place and person.  Moves all extremities. Psychiatric: Appears normal per normal affect.   Labs on Admission: I have personally reviewed following labs and imaging studies  CBC: No results for input(s): WBC, NEUTROABS, HGB, HCT, MCV, PLT in the last 168 hours. Basic Metabolic Panel: No results for input(s): NA, K, CL, CO2, GLUCOSE, BUN, CREATININE, CALCIUM, MG, PHOS in the last 168 hours. GFR: Estimated Creatinine Clearance: 35.5 mL/min (A) (by C-G formula based on SCr of 1.06 mg/dL (H)). Liver Function Tests: No results for input(s): AST, ALT, ALKPHOS, BILITOT, PROT, ALBUMIN in the last 168 hours. No results for input(s): LIPASE, AMYLASE in the last 168 hours. No results for input(s): AMMONIA in the last 168 hours. Coagulation Profile: No results for input(s): INR, PROTIME in the last 168 hours. Cardiac Enzymes: No results for input(s): CKTOTAL, CKMB, CKMBINDEX, TROPONINI in the last 168 hours. BNP (last 3 results) No results for input(s): PROBNP in the last 8760 hours. HbA1C: No results for input(s): HGBA1C in the last 72 hours. CBG: No results for input(s): GLUCAP in the last 168 hours. Lipid Profile: No results for input(s): CHOL, HDL, LDLCALC, TRIG, CHOLHDL, LDLDIRECT in the last 72 hours. Thyroid Function Tests: No results for input(s): TSH, T4TOTAL, FREET4, T3FREE, THYROIDAB in the last 72 hours. Anemia Panel: No results for input(s): VITAMINB12, FOLATE, FERRITIN, TIBC, IRON, RETICCTPCT in the last 72  hours. Urine analysis: No results found for: COLORURINE, APPEARANCEUR, LABSPEC, PHURINE, GLUCOSEU, HGBUR, BILIRUBINUR, KETONESUR, PROTEINUR, UROBILINOGEN, NITRITE, LEUKOCYTESUR Sepsis Labs: @LABRCNTIP (procalcitonin:4,lacticidven:4) )No results found for this or any previous visit (from the past 240 hour(s)).   Radiological Exams on Admission: No results found.  EKG: Independently reviewed.  Normal sinus rhythm with first-degree AV block.  Assessment/Plan Principal Problem:   Closed right hip fracture, initial encounter (HCC) Active Problems:   HTN (hypertension)   DOE (dyspnea on exertion)   Gout   CKD (chronic kidney disease) stage 3, GFR 30-59 ml/min (HCC)   PVC's (premature ventricular contractions)    1. Right hip fracture status post mechanical fall -patient appears at moderate risk for intermediate risk procedure.  Will  keep patient n.p.o. past midnight in anticipation of surgery.  Pain relief medications.  Further recommendations per orthopedic surgery. 2. History of hypertension on atenolol. 3. History of PVCs on atenolol. 4. Chronic kidney disease stage III creatinine appears to be at baseline.   DVT prophylaxis: SCDs. Code Status: Full code. Family Communication: Discussed with patient. Disposition Plan: Probably will need rehab. Consults called: Orthopedic surgery. Admission status: Inpatient.   Eduard ClosArshad N Shep Porter MD [facility] Pager [phone].  If 7PM-7AM, please contact night-coverage [email or web address] Password [email or web address]  06/12/2018, 1:35 AM

## 2018-06-13 ENCOUNTER — Encounter (HOSPITAL_COMMUNITY): Payer: Self-pay | Admitting: Orthopaedic Surgery

## 2018-06-13 LAB — CBC
HEMATOCRIT: 31.5 % — AB (ref 36.0–46.0)
Hemoglobin: 10.3 g/dL — ABNORMAL LOW (ref 12.0–15.0)
MCH: 33 pg (ref 26.0–34.0)
MCHC: 32.7 g/dL (ref 30.0–36.0)
MCV: 101 fL — ABNORMAL HIGH (ref 78.0–100.0)
Platelets: 137 10*3/uL — ABNORMAL LOW (ref 150–400)
RBC: 3.12 MIL/uL — AB (ref 3.87–5.11)
RDW: 13.7 % (ref 11.5–15.5)
WBC: 13.1 10*3/uL — AB (ref 4.0–10.5)

## 2018-06-13 LAB — BASIC METABOLIC PANEL
ANION GAP: 9 (ref 5–15)
BUN: 13 mg/dL (ref 8–23)
CALCIUM: 7.9 mg/dL — AB (ref 8.9–10.3)
CO2: 24 mmol/L (ref 22–32)
Chloride: 100 mmol/L (ref 98–111)
Creatinine, Ser: 1.19 mg/dL — ABNORMAL HIGH (ref 0.44–1.00)
GFR calc non Af Amer: 41 mL/min — ABNORMAL LOW (ref >60)
GFR, EST AFRICAN AMERICAN: 48 mL/min — AB (ref >60)
Glucose, Bld: 144 mg/dL — ABNORMAL HIGH (ref 70–99)
Potassium: 3.7 mmol/L (ref 3.5–5.1)
Sodium: 133 mmol/L — ABNORMAL LOW (ref 135–145)

## 2018-06-13 MED ORDER — ATENOLOL 25 MG PO TABS
12.5000 mg | ORAL_TABLET | Freq: Every day | ORAL | Status: DC
  Administered 2018-06-14: 12.5 mg via ORAL
  Filled 2018-06-13: qty 0.5

## 2018-06-13 MED ORDER — SENNOSIDES-DOCUSATE SODIUM 8.6-50 MG PO TABS
2.0000 | ORAL_TABLET | Freq: Two times a day (BID) | ORAL | Status: DC
  Administered 2018-06-14: 2 via ORAL
  Filled 2018-06-13: qty 2

## 2018-06-13 MED FILL — Fentanyl Citrate Preservative Free (PF) Inj 100 MCG/2ML: INTRAMUSCULAR | Qty: 2 | Status: AC

## 2018-06-13 NOTE — Evaluation (Signed)
Physical Therapy Evaluation Patient Details Name: Rhonda Spears MRN: [id number] DOB: 02/11/1934 Today's Date: 06/13/2018   History of Present Illness  Pt is an 84 y/o female s/p R femur IM nail. PMH including but not limited to CKD, COPD and HTN.  Clinical Impression  Pt presented supine in bed with HOB elevated, awake and willing to participate in therapy session. Prior to admission, pt reported that she was independent with all functional mobility and ADLs. Pt currently requires mod-max A for bed mobility and mod A for sit<>stand transfers. Pt very limited this session secondary to pain, weakness, dizziness in standing and reporting "everthing is going dark" in standing. Therefore, returned to sitting position after only a few seconds of standing at EOB. Pt would continue to benefit from skilled physical therapy services at this time while admitted and after d/c to address the below listed limitations in order to improve overall safety and independence with functional mobility.     Follow Up Recommendations SNF    Equipment Recommendations  None recommended by PT    Recommendations for Other Services       Precautions / Restrictions Precautions Precautions: Fall Restrictions Weight Bearing Restrictions: Yes RLE Weight Bearing: Weight bearing as tolerated      Mobility  Bed Mobility Overal bed mobility: Needs Assistance Bed Mobility: Supine to Sit;Sit to Supine     Supine to sit: Mod assist Sit to supine: Max assist   General bed mobility comments: increased time and effort, assist with R LE movement off of and onto bed, assistance with trunk elevation as well  Transfers Overall transfer level: Needs assistance Equipment used: Rolling walker (2 wheeled) Transfers: Sit to/from Stand Sit to Stand: Mod assist         General transfer comment: increased time and effort, cueing for safe hand placement, mod A to power into standing from EOB  Ambulation/Gait              General Gait Details: deferred as pt with reported dizziness and stating "everything is going dark" after a few seconds of standing  Stairs            Wheelchair Mobility    Modified Rankin (Stroke Patients Only)       Balance Overall balance assessment: Needs assistance Sitting-balance support: Feet supported Sitting balance-Leahy Scale: Fair     Standing balance support: Bilateral upper extremity supported Standing balance-Leahy Scale: Poor                               Pertinent Vitals/Pain Pain Assessment: Faces Faces Pain Scale: Hurts even more Pain Location: R hip Pain Descriptors / Indicators: Sore;Grimacing;Guarding Pain Intervention(s): Monitored during session;Repositioned    Home Living Family/patient expects to be discharged to:: Private residence Living Arrangements: Spouse/significant other Available Help at Discharge: Family;Available 24 hours/day Type of Home: House Home Access: Stairs to enter   Entergy CorporationEntrance Stairs-Number of Steps: 1 Home Layout: One level Home Equipment: Shower seat;Hand held Careers information officershower head;Walker - 2 wheels;Crutches;Cane - single point      Prior Function Level of Independence: Independent               Hand Dominance        Extremity/Trunk Assessment   Upper Extremity Assessment Upper Extremity Assessment: Generalized weakness    Lower Extremity Assessment Lower Extremity Assessment: Generalized weakness;RLE deficits/detail RLE Deficits / Details: pt with decreased strength and ROM limitations secondary to post-op  pain and weakness.  RLE: Unable to fully assess due to pain       Communication   Communication: No difficulties  Cognition Arousal/Alertness: Awake/alert Behavior During Therapy: WFL for tasks assessed/performed Overall Cognitive Status: Within Functional Limits for tasks assessed                                        General Comments      Exercises      Assessment/Plan    PT Assessment Patient needs continued PT services  PT Problem List Decreased strength;Decreased range of motion;Decreased activity tolerance;Decreased balance;Decreased mobility;Decreased coordination;Decreased knowledge of use of DME;Decreased safety awareness;Decreased knowledge of precautions;Pain       PT Treatment Interventions DME instruction;Gait training;Stair training;Functional mobility training;Therapeutic activities;Therapeutic exercise;Balance training;Neuromuscular re-education;Patient/family education    PT Goals (Current goals can be found in the Care Plan section)  Acute Rehab PT Goals Patient Stated Goal: decrease pain PT Goal Formulation: With patient Time For Goal Achievement: 06/27/18 Potential to Achieve Goals: Fair    Frequency Min 2X/week   Barriers to discharge        Co-evaluation               AM-PAC PT "6 Clicks" Daily Activity  Outcome Measure Difficulty turning over in bed (including adjusting bedclothes, sheets and blankets)?: Unable Difficulty moving from lying on back to sitting on the side of the bed? : Unable Difficulty sitting down on and standing up from a chair with arms (e.g., wheelchair, bedside commode, etc,.)?: Unable Help needed moving to and from a bed to chair (including a wheelchair)?: A Lot Help needed walking in hospital room?: Total Help needed climbing 3-5 steps with a railing? : Total 6 Click Score: 7    End of Session Equipment Utilized During Treatment: Gait belt Activity Tolerance: Patient limited by pain;Patient limited by fatigue Patient left: in bed;with call bell/phone within reach;with family/visitor present;with SCD's reapplied Nurse Communication: Mobility status PT Visit Diagnosis: Other abnormalities of gait and mobility (R26.89);Pain Pain - Right/Left: Right Pain - part of body: Hip    Time: [id number] PT Time Calculation (min) (ACUTE ONLY): 19 min   Charges:   PT  Evaluation $PT Eval Moderate Complexity: 1 [address]Mod          Heide Brossart, [place], [place] [phone]   Rhonda BevelsJennifer M [name] 06/13/2018, 5:33 PM

## 2018-06-13 NOTE — Progress Notes (Signed)
Nutrition Brief Note  RD consulted via hip fracture protocol.   Wt Readings from Last 15 Encounters:  06/12/18 65.5 kg  06/03/18 64.9 kg  05/10/18 63.5 kg  04/19/18 61.7 kg  10/01/17 61.2 kg  04/17/17 61.7 kg  04/03/17 60.8 kg  03/30/17 60.8 kg   Body mass index is 27.3 kg/m. Patient meets criteria for Overweight based on current BMI.   Pt's current diet order is Regular. She is hungry. Did not receive a lunch tray. RN aware.   Pt reports eating well PTA. No unintentional weight loss. Labs & medications reviewed.   No nutrition interventions warranted at this time. If nutrition issues arise, please re-consult RD.   Maureen ChattersKatie Manasseh Pittsley, RD, LDN Pager #: [phone] After-Hours Pager #: [phone]

## 2018-06-13 NOTE — Progress Notes (Signed)
ORTHOPAEDIC PROGRESS NOTE  s/p Procedure(s): INTRAMEDULLARY (IM) NAIL INTERTROCHANTRIC R   SUBJECTIVE: Reports mild pain about operative site. No chest pain. No SOB. No nausea/vomiting. No other complaints.  OBJECTIVE: PE: AVW:[id number]:incision CDI, leg lengths equal, warm well perfused foot, intact EHL/TA/GSC   Vitals:   06/12/18 2328 06/13/18 0455  BP: (!) 92/54 (!) 96/55  Pulse: 74 66  Resp: 14 14  Temp: 98 F (36.7 C) 98.1 F (36.7 C)  SpO2: 98% 99%     ASSESSMENT: Rhonda Spears is a 84 y.o. female doing well postoperatively.  PLAN: Weightbearing: WBAT RLE Insicional and dressing care: OK to remove dressings POD7 and leave open to air with dry gauze PRN Orthopedic device(s): None Showering: prn VTE prophylaxis: Lovenox 40mg  qd x6weeks Pain control: prn meds Follow - up plan: 2 weeks Contact information:  Weekdays 8-5 Ramond Marrowax Mariah Harn MD [phone], After hours and holidays please check [email or web address] for group call information for [facility]

## 2018-06-13 NOTE — Progress Notes (Signed)
Patient Demographics:    Tiffane Sheldon, is a 84 y.o. female, DOB - 25-May-1934, UJW:[id number]  Admit date - 06/11/2018   Admitting Physician Pearson Grippe, MD  Outpatient Primary MD for the patient is Kela Millin, MD  LOS - 2   No chief complaint on file.       Subjective:    Elvera Bicker today has no fevers, no emesis,  No chest pain,  Rt hip pain is better   Assessment  & Plan :    Principal Problem:   Closed right hip fracture, initial encounter (HCC) Active Problems:   HTN (hypertension)   DOE (dyspnea on exertion)   Gout   CKD (chronic kidney disease) stage 3, GFR 30-59 ml/min (HCC)   PVC's (premature ventricular contractions)   Brief Summary 84 y.o.femalewithhistory of hypertension, dyspnea on exertion , chronic kidney disease stage III  Who sustained right hip fracture after mechanical fall at home and was subsequently transferred from [facility] in [place] for further orthopedic evaluation, went to OR on 06/12/2018 for ORIF  Plan:-  1)HTN---  BP slightly soft, Patient with history of PVCs as well, continue atenolol 12.5 mg daily  2)CKD III--  Stable, avoid nephrotoxin agent,  3)Rt Hip Fx----  Status post mechanical fall with right hip pressure, status post ORIF on 06/12/2018, further care per orthopedic team  Code Status : Full  Disposition Plan  : SNF Rehab  Consults  :  ortho  DVT Prophylaxis  :  Lovenox -   Lab Results  Component Value Date   PLT 137 (L) 06/13/2018    Inpatient Medications  Scheduled Meds: . [START ON 06/14/2018] atenolol  12.5 mg Oral Daily  . enoxaparin (LOVENOX) injection  40 mg Subcutaneous Q24H  . senna-docusate  2 tablet Oral BID   Continuous Infusions: . lactated ringers 10 mL/hr at 06/12/18 1200  . methocarbamol (ROBAXIN) IV 500 mg (06/12/18 0959)   PRN Meds:.acetaminophen, HYDROmorphone (DILAUDID) injection,  menthol-cetylpyridinium **OR** phenol, methocarbamol (ROBAXIN) IV, metoCLOPramide **OR** metoCLOPramide (REGLAN) injection, morphine injection, ondansetron **OR** ondansetron (ZOFRAN) IV, oxyCODONE    Anti-infectives (From admission, onward)   Start     Dose/Rate Route Frequency Ordered Stop   06/12/18 2000  ceFAZolin (ANCEF) IVPB 2g/100 mL premix     2 g 200 mL/hr over 30 Minutes Intravenous Every 6 hours 06/12/18 1639 06/13/18 0224   06/12/18 1130  ceFAZolin (ANCEF) IVPB 2g/100 mL premix     2 g 200 mL/hr over 30 Minutes Intravenous To [facility]Stay [facility] 06/12/18 1122 06/12/18 1327        Objective:   Vitals:   06/12/18 2328 06/13/18 0455 06/13/18 1134 06/13/18 1501  BP: (!) 92/54 (!) 96/55  (!) 86/50  Pulse: 74 66 60 60  Resp: 14 14  14   Temp: 98 F (36.7 C) 98.1 F (36.7 C)  98.1 F (36.7 C)  TempSrc: Oral Oral  Oral  SpO2: 98% 99%  96%  Weight:      Height:        Wt Readings from Last 3 Encounters:  06/12/18 65.5 kg  06/03/18 64.9 kg  05/10/18 63.5 kg     Intake/Output Summary (Last 24 hours) at 06/13/2018 1722 Last data filed at 06/13/2018 1526 Gross per  24 hour  Intake 419.44 ml  Output 1750 ml  Net -1330.56 ml     Physical Exam  Gen:- Awake Alert,  In no apparent distress  HEENT:- Benjamin Perez.AT, No sclera icterus Neck-Supple Neck,No JVD,.  Lungs-  CTAB ,  Good air movement CV- S1, S2 normal Abd-  +ve B.Sounds, Abd Soft, No tenderness,    Extremity/Skin:- No  edema, Rt Hip post up wound clean dry and intact Psych-affect is appropriate, oriented x3 Neuro-no new focal deficits, no tremors   Data Review:   Micro Results Recent Results (from the past 240 hour(s))  MRSA PCR Screening     Status: None   Collection Time: 06/12/18  1:19 AM  Result Value Ref Range Status   MRSA by PCR NEGATIVE NEGATIVE Final    Comment:        The GeneXpert MRSA Assay (FDA approved for NASAL specimens only), is one component of a comprehensive MRSA  colonization surveillance program. It is not intended to diagnose MRSA infection nor to guide or monitor treatment for MRSA infections. Performed at [facility], [address] [place], [place] [address]     Radiology Reports Nm Myocar Multi W/spect W/wall Motion / Ef  Result Date: 05/20/2018  No diagnostic ST segment changes to indicate ischemia. Occasional PVCs noted in recovery.  Small, mild intensity, mid to apical inferior defect that is more prominent at rest and fixed, consistent with soft tissue attenuation. No significant ischemic defects.  This is a low risk study.  Nuclear stress EF: 72%.    Dg C-arm 1-60 Min  Result Date: 06/12/2018 CLINICAL DATA:  Intramedullary rod fixation of right femur. EXAM: RIGHT FEMUR 2 VIEWS; DG C-ARM 61-120 MIN FLUOROSCOPY TIME:  1 minutes 15 seconds. COMPARISON:  Radiographs of June 12, 2018. FINDINGS: Five intraoperative fluoroscopic images of the right femur demonstrate the patient be status post intramedullary rod fixation of the right femur and internal fixation of proximal right femoral intertrochanteric fracture. Good alignment of fracture components is noted. IMPRESSION: Status post intramedullary rod fixation of right femur and internal fixation of proximal right femoral intertrochanteric fracture. Electronically Signed   By: Lupita RaiderJames  Green Jr, M.D.   On: 06/12/2018 15:33   Dg Femur, Min 2 Views Right  Result Date: 06/12/2018 CLINICAL DATA:  Intramedullary rod fixation of right femur. EXAM: RIGHT FEMUR 2 VIEWS; DG C-ARM 61-120 MIN FLUOROSCOPY TIME:  1 minutes 15 seconds. COMPARISON:  Radiographs of June 12, 2018. FINDINGS: Five intraoperative fluoroscopic images of the right femur demonstrate the patient be status post intramedullary rod fixation of the right femur and internal fixation of proximal right femoral intertrochanteric fracture. Good alignment of fracture components is noted. IMPRESSION: Status post intramedullary rod  fixation of right femur and internal fixation of proximal right femoral intertrochanteric fracture. Electronically Signed   By: Lupita RaiderJames  Green Jr, M.D.   On: 06/12/2018 15:33   Dg Femur Port, Min 2 Views Right  Result Date: 06/12/2018 CLINICAL DATA:  Right hip fracture status post ORIF EXAM: RIGHT FEMUR PORTABLE 2 VIEW COMPARISON:  None. FINDINGS: Intertrochanteric fracture transfixed with a intramedullary nail and interlocking femoral neck screw without hardware failure or complication. Anatomic alignment. Postsurgical changes at the orthopedic hardware insertion. No other fracture or dislocation. IMPRESSION: ORIF right intertrochanteric fracture. Electronically Signed   By: Elige KoHetal  Patel   On: 06/12/2018 16:40   Dg Femur Port, Min 2 Views Right  Result Date: 06/12/2018 CLINICAL DATA:  Right hip fracture.  Fall yesterday. EXAM: RIGHT  FEMUR PORTABLE 2 VIEW COMPARISON:  06/11/2018 FINDINGS: There is a comminuted right femoral intertrochanteric fracture noted. Mild displacement and varus angulation. No additional acute femoral abnormality. No subluxation or dislocation. IMPRESSION: Comminuted, displaced right femoral intertrochanteric fracture with varus angulation. Electronically Signed   By: Charlett NoseKevin  Dover M.D.   On: 06/12/2018 09:35     CBC Recent Labs  Lab 06/12/18 0220 06/13/18 0431  WBC 12.3* 13.1*  HGB 12.3 10.3*  HCT 37.2 31.5*  PLT 168 137*  MCV 99.5 101.0*  MCH 32.9 33.0  MCHC 33.1 32.7  RDW 13.8 13.7  LYMPHSABS 2.7  --   MONOABS 0.9  --   EOSABS 0.1  --   BASOSABS 0.1  --     Chemistries  Recent Labs  Lab 06/12/18 0220 06/13/18 0431  NA 136 133*  K 4.4 3.7  CL 100 100  CO2 28 24  GLUCOSE 114* 144*  BUN 18 13  CREATININE 1.18* 1.19*  CALCIUM 9.0 7.9*  AST 26  --   ALT 17  --   ALKPHOS 65  --   BILITOT 1.0  --    ------------------------------------------------------------------------------------------------------------------ No results for input(s): CHOL, HDL,  LDLCALC, TRIG, CHOLHDL, LDLDIRECT in the last 72 hours.  No results found for: HGBA1C ------------------------------------------------------------------------------------------------------------------ No results for input(s): TSH, T4TOTAL, T3FREE, THYROIDAB in the last 72 hours.  Invalid input(s): FREET3 ------------------------------------------------------------------------------------------------------------------ No results for input(s): VITAMINB12, FOLATE, FERRITIN, TIBC, IRON, RETICCTPCT in the last 72 hours.  Coagulation profile Recent Labs  Lab 06/12/18 0220  INR 1.45    No results for input(s): DDIMER in the last 72 hours.  Cardiac Enzymes No results for input(s): CKMB, TROPONINI, MYOGLOBIN in the last 168 hours.  Invalid input(s): CK ------------------------------------------------------------------------------------------------------------------    Component Value Date/Time   BNP 207.0 (H) 05/10/2018 1547   Shon Haleourage Shareka Casale M.D on 06/13/2018 at 5:22 PM   Go to [email or web address] - password [email or web address] for contact info  [facility] - Office  [phone]

## 2018-06-14 LAB — BASIC METABOLIC PANEL
Anion gap: 6 (ref 5–15)
BUN: 17 mg/dL (ref 8–23)
CALCIUM: 8.1 mg/dL — AB (ref 8.9–10.3)
CO2: 27 mmol/L (ref 22–32)
CREATININE: 1.05 mg/dL — AB (ref 0.44–1.00)
Chloride: 103 mmol/L (ref 98–111)
GFR calc non Af Amer: 48 mL/min — ABNORMAL LOW (ref >60)
GFR, EST AFRICAN AMERICAN: 56 mL/min — AB (ref >60)
GLUCOSE: 114 mg/dL — AB (ref 70–99)
Potassium: 4 mmol/L (ref 3.5–5.1)
Sodium: 136 mmol/L (ref 135–145)

## 2018-06-14 LAB — CBC
HEMATOCRIT: 31 % — AB (ref 36.0–46.0)
Hemoglobin: 10.1 g/dL — ABNORMAL LOW (ref 12.0–15.0)
MCH: 33.1 pg (ref 26.0–34.0)
MCHC: 32.6 g/dL (ref 30.0–36.0)
MCV: 101.6 fL — AB (ref 78.0–100.0)
Platelets: 138 10*3/uL — ABNORMAL LOW (ref 150–400)
RBC: 3.05 MIL/uL — ABNORMAL LOW (ref 3.87–5.11)
RDW: 13.7 % (ref 11.5–15.5)
WBC: 13.6 10*3/uL — ABNORMAL HIGH (ref 4.0–10.5)

## 2018-06-14 MED ORDER — CALCIUM-VITAMIN D 500-200 MG-UNIT PO TABS: 1.0000 | ORAL_TABLET | Freq: Three times a day (TID) | ORAL | 1 refills | Status: DC

## 2018-06-14 MED ORDER — ATENOLOL 25 MG PO TABS: 12.5000 mg | ORAL_TABLET | Freq: Every day | ORAL | 1 refills | Status: DC

## 2018-06-14 MED ORDER — ONDANSETRON HCL 4 MG PO TABS: 4.0000 mg | ORAL_TABLET | Freq: Four times a day (QID) | ORAL | 0 refills | Status: DC | PRN

## 2018-06-14 MED ORDER — ACETAMINOPHEN 325 MG PO TABS: 650.0000 mg | ORAL_TABLET | Freq: Four times a day (QID) | ORAL | 2 refills | Status: DC | PRN

## 2018-06-14 MED ORDER — AMITRIPTYLINE HCL 10 MG PO TABS: 5.0000 mg | ORAL_TABLET | Freq: Every evening | ORAL | 0 refills | Status: DC | PRN

## 2018-06-14 MED ORDER — ENOXAPARIN SODIUM 40 MG/0.4ML ~~LOC~~ SOLN: 40.0000 mg | Freq: Every day | SUBCUTANEOUS | 0 refills | Status: AC

## 2018-06-14 MED ORDER — METHOCARBAMOL 500 MG PO TABS: 500.0000 mg | ORAL_TABLET | Freq: Three times a day (TID) | ORAL | 0 refills | Status: AC

## 2018-06-14 MED ORDER — OXYCODONE HCL 5 MG PO TABS: 5.0000 mg | ORAL_TABLET | ORAL | 0 refills | Status: DC | PRN

## 2018-06-14 MED ORDER — SENNOSIDES-DOCUSATE SODIUM 8.6-50 MG PO TABS: 2.0000 | ORAL_TABLET | Freq: Two times a day (BID) | ORAL | 2 refills | Status: DC

## 2018-06-14 NOTE — Discharge Instructions (Signed)
1)Low-salt diet advised 2) Hold parameters for Atenolol as outlined (Please Hold atenolol for heart rate less than 60 or systolic blood pressure less than 100 mmhg ) 3) follow-up with orthopedic surgeon as advised 4) Lovenox 40 mg subcu daily for 28 days for post hip surgery DVT prophylaxis 5) physical therapy and Occupational Therapy as advised 6) WBCs elevated, repeat CBC advised on Monday, 06/17/2018

## 2018-06-14 NOTE — Clinical Social Work Note (Deleted)
[facility] has started [facility].   Velora MediateBridget Oralia Criger, MSW [phone]

## 2018-06-14 NOTE — Clinical Social Work Placement (Signed)
   CLINICAL SOCIAL WORK PLACEMENT  NOTE  Date:  06/14/2018  Patient Details  Name: Rhonda DadaYvonne L Traub MRN: [id number] Date of Birth: 09/25/1934  Clinical Social Work is seeking post-discharge placement for this patient at the Skilled  Nursing Facility level of care (*CSW will initial, date and re-position this form in  chart as items are completed):      Patient/family provided with [facility] Clinical Social Work Department's list of facilities offering this level of care within the geographic area requested by the patient (or if unable, by the patient's family).  Yes   Patient/family informed of their freedom to choose among providers that offer the needed level of care, that participate in Medicare, Medicaid or managed care program needed by the patient, have an available bed and are willing to accept the patient.      Patient/family informed of [facility]'s ownership interest in [facility] and [facility], as well as of the fact that they are under no obligation to receive care at these facilities.  PASRR submitted to EDS on       PASRR number received on       Existing PASRR number confirmed on       FL2 transmitted to all facilities in geographic area requested by pt/family on       FL2 transmitted to all facilities within larger geographic area on       Patient informed that his/her managed care company has contracts with or will negotiate with certain facilities, including the following:        Yes   Patient/family informed of bed offers received.  [facility]     Physician recommends and patient chooses bed at      [facility].  Patient to be transferred to facility by [facility]     Patient family notified on 06/14/18 of transfer.  Name of family member notified:  Olegario MessierKathy, daughter     PHYSICIAN       Additional Comment:     _______________________________________________ Maree KrabbeBridget A Zamia Tyminski, LCSW 06/14/2018, 11:37 AM

## 2018-06-14 NOTE — Discharge Summary (Addendum)
Rhonda Spears, is a 84 y.o. female  DOB [birth year]  MRN [id number].  Admission date:  06/11/2018  Admitting Physician  Pearson Grippe, MD  Discharge Date:  06/14/2018   Primary MD  Kela Millin, MD  Recommendations for primary care physician for things to follow:  1)Low-salt diet advised 2) Hold parameters for Atenolol as outlined (Please Hold atenolol for heart rate less than 60 or systolic blood pressure less than 100 mmhg ) 3) follow-up with orthopedic surgeon as advised 4) Lovenox 40 mg subcu daily for 28 days for post hip surgery DVT prophylaxis 5) physical therapy and Occupational Therapy as advised 6) WBCs elevated, repeat CBC advised on Monday, 06/17/2018  Admission Diagnosis  RT FEMUR FRACTURE   Discharge Diagnosis  RT FEMUR FRACTURE    Principal Problem:   Closed right hip fracture, initial encounter (HCC) Active Problems:   HTN (hypertension)   DOE (dyspnea on exertion)   Gout   CKD (chronic kidney disease) stage 3, GFR 30-59 ml/min (HCC)   PVC's (premature ventricular contractions)      Past Medical History:  Diagnosis Date  . Chest pain with moderate risk for cardiac etiology 03/30/2017  . CKD (chronic kidney disease), stage III (HCC)   . COPD (chronic obstructive pulmonary disease) (HCC)   . Gout   . Hypertension   . PVC's (premature ventricular contractions)   . Renal disorder   . Sinus bradycardia   . Tricuspid regurgitation   . Venous insufficiency     Past Surgical History:  Procedure Laterality Date  . arthroscopic left knee    . INTRAMEDULLARY (IM) NAIL INTERTROCHANTERIC Right 06/12/2018   Procedure: INTRAMEDULLARY (IM) NAIL INTERTROCHANTRIC;  Surgeon: Bjorn Pippin, MD;  Location: [facility] OR;  Service: Orthopedics;  Laterality: Right;  . repair left femoral neck    . right medial men         HPI  from the history and physical done on the day of admission:    Patient coming from: Patient was transferred from [facility].  Chief Complaint: Fall.  HPI: Rhonda Spears is a 84 y.o. female with history of hypertension, dyspnea on exertion attributed to possibly secondary to PVCs, chronic kidney disease stage III had a fall at home while changing her pants.  Denies hitting her head or losing consciousness.  Denies any chest pain or shortness of breath.  After the fall patient started having right hip pain and was taken to ER at [facility].  X-rays revealed right hip fracture and on-call orthopedic surgeon at [facility] was consulted.  Patient transferred for further work-up.  On my exam patient is not in any acute distress.  I have reviewed patient's labs done at [facility] and chest x-rays and EKG.  Chest x-ray shows some chronic changes.  Labs are largely unremarkable.  ED Course: Patient was a direct admit.   Hospital Course:    Brief Summary 84 y.o.femalewithhistory of hypertension, dyspnea on exertion , chronic kidney disease stage III  Who sustained  right hip fracture after mechanical fall at home and [facility] [place] [facility], went to OR on 06/12/2018 for ORIF, elevated WBC noted, but no signs or symptoms of infection at this time repeat CBC on 06/17/2018 advised  Plan:-  1)HTN--- stable, patient with history of PVCs as well, continue atenolol 12.5 mg daily with hold parameters to avoid hypotension and bradycardia  2)CKD III--  Stable, avoid nephrotoxic agent, maintain adequate hydration  3)Rt Hip Fx----  Status post mechanical fall with right hip pressure, status post ORIF on 06/12/2018, further care per orthopedic team  4) elevated WBC--- no evidence of acute infection this time, no fevers, no chills, no Reiger, postop wound does not look infected, no cough, no urinary symptoms repeat CBC on 06/17/2018 advised  Code Status : Full  Disposition Plan  : SNF Rehab  Consults  :   ortho   DVT Prophylaxis  :  Lovenox 40 mg subcu daily for 28 days for DVT prophylaxis-   Discharge Condition: stable  Follow UP-orthopedics as advised  Diet and Activity recommendation:  As advised  Discharge Instructions    Discharge Instructions    Call MD for:  difficulty breathing, headache or visual disturbances   Complete by:  As directed    Call MD for:  persistant dizziness or light-headedness   Complete by:  As directed    Call MD for:  persistant nausea and vomiting   Complete by:  As directed    Call MD for:  severe uncontrolled pain   Complete by:  As directed    Call MD for:  temperature >100.4   Complete by:  As directed    Diet - low sodium heart healthy   Complete by:  As directed    Discharge instructions   Complete by:  As directed    1)Low-salt diet advised 2) Hold parameters for Atenolol as outlined (Please Hold atenolol for heart rate less than 60 or systolic blood pressure less than 100 mmhg ) 3) follow-up with orthopedic surgeon as advised 4) Lovenox 40 mg subcu daily for 28 days for post hip surgery DVT prophylaxis 5) physical therapy and Occupational Therapy as advised 6) WBCs elevated, repeat CBC advised on Monday, 06/17/2018   Increase activity slowly   Complete by:  As directed    May ambulate with assistance, with physical therapist and occupational therapist        Discharge Medications     Allergies as of 06/14/2018      Reactions   Latex Itching   Omeprazole Palpitations   Tramadol Itching, Nausea Only   Lasix [furosemide] Other (See Comments)   SEVERE HEADACHE   Sulfa Antibiotics Nausea Only, Other (See Comments)   Severe headaches   Zanaflex [tizanidine Hcl] Itching, Other (See Comments)   oversedation      Medication List    STOP taking these medications   DOCUSATE SODIUM PO   FOLIC ACID PO   HYDROcodone-acetaminophen 5-325 MG tablet Commonly known as:  NORCO/VICODIN   MAGNESIUM PO     TAKE these medications     acetaminophen 325 MG tablet Commonly known as:  TYLENOL Take 2 tablets (650 mg total) by mouth every 6 (six) hours as needed for mild pain (pain score 1-3 or temp > 100.5).   allopurinol 100 MG tablet Commonly known as:  ZYLOPRIM Take 200 mg by mouth at bedtime.   amitriptyline 10 MG tablet Commonly known as:  ELAVIL Take 0.5 tablets (  5 mg total) by mouth at bedtime as needed for sleep. What changed:    when to take this  reasons to take this   atenolol 25 MG tablet Commonly known as:  TENORMIN Take 0.5 tablets (12.5 mg total) by mouth daily. Hold for heart rate less than 60 bpm or systolic blood pressure less than 100 mmhg Start taking on:  06/15/2018 What changed:    how much to take  additional instructions   calcium-vitamin D 500-200 MG-UNIT tablet Take 1 tablet by mouth 3 (three) times daily. What changed:    medication strength  when to take this   cholecalciferol 1000 units tablet Commonly known as:  VITAMIN D Take 1,000 Units by mouth every morning.   enoxaparin 40 MG/0.4ML injection Commonly known as:  LOVENOX Inject 0.4 mLs (40 mg total) into the skin daily at 12 noon. For post Hip Surgery DVT Prophylaxis   methocarbamol 500 MG tablet Commonly known as:  ROBAXIN Take 1 tablet (500 mg total) by mouth 3 (three) times daily for 15 days.   multivitamin capsule Take 1 capsule by mouth daily.   ondansetron 4 MG tablet Commonly known as:  ZOFRAN Take 1 tablet (4 mg total) by mouth every 6 (six) hours as needed for nausea or vomiting.   oxyCODONE 5 MG immediate release tablet Commonly known as:  Oxy IR/ROXICODONE Take 1 tablet (5 mg total) by mouth every 4 (four) hours as needed for moderate pain or severe pain.   senna-docusate 8.6-50 MG tablet Commonly known as:  Senokot-S Take 2 tablets by mouth 2 (two) times daily.       Major procedures and Radiology Reports - PLEASE review detailed and final reports for all details, in brief -   Nm Myocar  Multi W/spect W/wall Motion / Ef  Result Date: 05/20/2018  No diagnostic ST segment changes to indicate ischemia. Occasional PVCs noted in recovery.  Small, mild intensity, mid to apical inferior defect that is more prominent at rest and fixed, consistent with soft tissue attenuation. No significant ischemic defects.  This is a low risk study.  Nuclear stress EF: 72%.    Dg C-arm 1-60 Min  Result Date: 06/12/2018 CLINICAL DATA:  Intramedullary rod fixation of right femur. EXAM: RIGHT FEMUR 2 VIEWS; DG C-ARM 61-120 MIN FLUOROSCOPY TIME:  1 minutes 15 seconds. COMPARISON:  Radiographs of June 12, 2018. FINDINGS: Five intraoperative fluoroscopic images of the right femur demonstrate the patient be status post intramedullary rod fixation of the right femur and internal fixation of proximal right femoral intertrochanteric fracture. Good alignment of fracture components is noted. IMPRESSION: Status post intramedullary rod fixation of right femur and internal fixation of proximal right femoral intertrochanteric fracture. Electronically Signed   By: Lupita Raider, M.D.   On: 06/12/2018 15:33   Dg Femur, Min 2 Views Right  Result Date: 06/12/2018 CLINICAL DATA:  Intramedullary rod fixation of right femur. EXAM: RIGHT FEMUR 2 VIEWS; DG C-ARM 61-120 MIN FLUOROSCOPY TIME:  1 minutes 15 seconds. COMPARISON:  Radiographs of June 12, 2018. FINDINGS: Five intraoperative fluoroscopic images of the right femur demonstrate the patient be status post intramedullary rod fixation of the right femur and internal fixation of proximal right femoral intertrochanteric fracture. Good alignment of fracture components is noted. IMPRESSION: Status post intramedullary rod fixation of right femur and internal fixation of proximal right femoral intertrochanteric fracture. Electronically Signed   By: Lupita Raider, M.D.   On: 06/12/2018 15:33   Dg Femur [place], [place]  2 Views Right  Result Date: 06/12/2018 CLINICAL DATA:  Right  hip fracture status post ORIF EXAM: RIGHT FEMUR PORTABLE 2 VIEW COMPARISON:  None. FINDINGS: Intertrochanteric fracture transfixed with a intramedullary nail and interlocking femoral neck screw without hardware failure or complication. Anatomic alignment. Postsurgical changes at the orthopedic hardware insertion. No other fracture or dislocation. IMPRESSION: ORIF right intertrochanteric fracture. Electronically Signed   By: Elige KoHetal  Patel   On: 06/12/2018 16:40   Dg Femur [place], Min 2 Views Right  Result Date: 06/12/2018 CLINICAL DATA:  Right hip fracture.  Fall yesterday. EXAM: RIGHT FEMUR PORTABLE 2 VIEW COMPARISON:  06/11/2018 FINDINGS: There is a comminuted right femoral intertrochanteric fracture noted. Mild displacement and varus angulation. No additional acute femoral abnormality. No subluxation or dislocation. IMPRESSION: Comminuted, displaced right femoral intertrochanteric fracture with varus angulation. Electronically Signed   By: Charlett NoseKevin  Dover M.D.   On: 06/12/2018 09:35    Micro Results   Recent Results (from the past 240 hour(s))  MRSA PCR Screening     Status: None   Collection Time: 06/12/18  1:19 AM  Result Value Ref Range Status   MRSA by PCR NEGATIVE NEGATIVE Final    Comment:        The GeneXpert MRSA Assay (FDA approved for NASAL specimens only), is one component of a comprehensive MRSA colonization surveillance program. It is not intended to diagnose MRSA infection nor to guide or monitor treatment for MRSA infections. Performed at [facility], [address] [place], [place] [address]        Today   Subjective    Rhonda BickerYvonne Spears today has  no new complaints, no fevers, no chills, no cough, no urinary symptoms, no abdominal pain,, eating and drinking well, no vomiting or diarrhea,          Patient has been seen and examined prior to discharge   Objective   Blood pressure (!) 122/57, pulse 87, temperature 98 F (36.7 C), temperature source Oral,  resp. rate 14, height 5' 0.98" (1.549 m), weight 65.5 kg, SpO2 94 %.   Intake/Output Summary (Last 24 hours) at 06/14/2018 1053 Last data filed at 06/14/2018 1043 Gross per 24 hour  Intake 805.5 ml  Output 2900 ml  Net -2094.5 ml    Exam Gen:- Awake Alert,  In no apparent distress  HEENT:- [place].AT, No sclera icterus Neck-Supple Neck,No JVD,.  Lungs-  CTAB ,  Good air movement CV- S1, S2 normal Abd-  +ve B.Sounds, Abd Soft, No tenderness,    Extremity/Skin:- No  edema, Rt Hip post up wound clean dry and intact Psych-affect is appropriate, oriented x3 Neuro-no new focal deficits, no tremors   Data Review   CBC w Diff:  Lab Results  Component Value Date   WBC 13.6 (H) 06/14/2018   HGB 10.1 (L) 06/14/2018   HCT 31.0 (L) 06/14/2018   PLT 138 (L) 06/14/2018   LYMPHOPCT 22 06/12/2018   MONOPCT 7 06/12/2018   EOSPCT 0 06/12/2018   BASOPCT 1 06/12/2018    CMP:  Lab Results  Component Value Date   NA 136 06/14/2018   K 4.0 06/14/2018   CL 103 06/14/2018   CO2 27 06/14/2018   BUN 17 06/14/2018   CREATININE 1.05 (H) 06/14/2018   PROT 6.4 (L) 06/12/2018   ALBUMIN 3.1 (L) 06/12/2018   BILITOT 1.0 06/12/2018   ALKPHOS 65 06/12/2018   AST 26 06/12/2018   ALT 17 06/12/2018    Total Discharge time is about 33  minutes  Shon Haleourage Rhonda Spears M.D on 06/14/2018 at 10:53 AM   Go to [email or web address] - password [email or web address] for contact info  [facility] - Office  [phone]

## 2018-06-14 NOTE — Care Management Important Message (Signed)
Important Message  Patient Details  Name: Rhonda Spears MRN: [id number] Date of Birth: 03/12/1934   Medicare Important Message Given:  Yes    Nonie Lochner Stefan ChurchBratton 06/14/2018, 3:42 PM

## 2018-06-14 NOTE — Clinical Social Work Note (Signed)
Clinical Social Worker facilitated patient discharge including contacting patient family and facility to confirm patient discharge plans.  Clinical information faxed to facility and family agreeable with plan.  CSW arranged ambulance transport via [facility] to [place] (2:30 PM) .  RN to call [phone] for report prior to discharge.  Clinical Social Worker will sign off for now as social work intervention is no longer needed. Please consult us again if new need arises.  Velora MediateBridget Ryu Cerreta, MSW [phone]

## 2018-06-14 NOTE — [place] FL2 (Signed)
[employer] MEDICAID FL2 LEVEL OF CARE SCREENING TOOL     IDENTIFICATION  Patient Name: Rhonda DadaYvonne L Widmann Birthdate: 01/20/1934 Sex: female Admission Date (Current Location): 06/11/2018  [facility] [place]   Facility and Address:  [facility], [address], [place], [place] [address]      Provider Number: [id number]  Attending Physician Name and Address:  Shon HaleEmokpae, Courage, MD  Relative Name and Phone Number:       Current Level of Care: Hospital Recommended Level of Care: Skilled Nursing Facility Prior Approval Number:    Date Approved/Denied:   PASRR Number:    Discharge Plan: SNF    Current Diagnoses: Patient Active Problem List   Diagnosis Date Noted  . Closed right hip fracture, initial encounter (HCC) 06/12/2018  . Lower extremity edema 05/29/2018  . PVC's (premature ventricular contractions) 05/09/2018  . CKD (chronic kidney disease) stage 3, GFR 30-59 ml/min (HCC) 04/20/2018  . HTN (hypertension) 03/30/2017  . Atypical chest pain 03/30/2017  . Chronic headache disorder 03/30/2017  . Osteoarthritis 03/30/2017  . Venous insufficiency 03/30/2017  . Diverticulosis 03/30/2017  . Bigeminy 03/30/2017  . DOE (dyspnea on exertion) 03/30/2017  . Idiopathic hypotension 03/30/2017  . Chest pain with moderate risk for cardiac etiology 03/30/2017  . Gout   . Renal disorder     Orientation RESPIRATION BLADDER Height & Weight     Self, Time, Situation, Place  Normal Continent Weight: 144 lb 6.4 oz (65.5 kg) Height:  5' 0.98" (154.9 cm)  BEHAVIORAL SYMPTOMS/MOOD NEUROLOGICAL BOWEL NUTRITION STATUS      Continent Diet(regular diet, thin liquids)  AMBULATORY STATUS COMMUNICATION OF NEEDS Skin   Extensive Assist Verbally Surgical wounds(Closed incision right hip, adhesive bandage)                       Personal Care Assistance Level of Assistance  Dressing, Feeding, Bathing Bathing Assistance: Maximum  assistance Feeding assistance: Independent Dressing Assistance: Maximum assistance     Functional Limitations Info  Sight, Hearing, Speech Sight Info: Adequate Hearing Info: Adequate Speech Info: Adequate    SPECIAL CARE FACTORS FREQUENCY  PT (By licensed PT), OT (By licensed OT)     PT Frequency: 2x OT Frequency: 2x            Contractures Contractures Info: Not present    Additional Factors Info  Code Status, Allergies Code Status Info: Full Code Allergies Info: Latex, Omeprazole, Tramadol, Lasix Furosemide, Sulfa Antibiotics, Zanaflex Tizanidine Hcl           Current Medications (06/14/2018):  This is the current hospital active medication list Current Facility-Administered Medications  Medication Dose Route Frequency Provider Last Rate Last Dose  . acetaminophen (TYLENOL) tablet 325-650 mg  325-650 mg Oral Q6H PRN Bjorn PippinVarkey, Dax T, MD   650 mg at 06/13/18 1755  . atenolol (TENORMIN) tablet 12.5 mg  12.5 mg Oral Daily Emokpae, Courage, MD   12.5 mg at 06/14/18 0844  . enoxaparin (LOVENOX) injection 40 mg  40 mg Subcutaneous Q24H Ramond MarrowVarkey, Dax T, MD   40 mg at 06/14/18 0845  . HYDROmorphone (DILAUDID) injection 0.5-1 mg  0.5-1 mg Intravenous Q4H PRN Bjorn PippinVarkey, Dax T, MD      . lactated ringers infusion   Intravenous Continuous Lowella CurbMiller, Warren Ray, MD 10 mL/hr at 06/12/18 1200    . menthol-cetylpyridinium (CEPACOL) lozenge 3 mg  1 lozenge Oral PRN Bjorn PippinVarkey, Dax T, MD  Or  . phenol (CHLORASEPTIC) mouth spray 1 spray  1 spray Mouth/Throat PRN Ramond MarrowVarkey, Dax T, MD      . methocarbamol (ROBAXIN) 500 mg in dextrose 5 % 50 mL IVPB  500 mg Intravenous Q8H PRN Schorr, Roma KayserKatherine P, NP 100 mL/hr at 06/12/18 0959 500 mg at 06/12/18 0959  . metoCLOPramide (REGLAN) tablet 5-10 mg  5-10 mg Oral Q8H PRN Bjorn PippinVarkey, Dax T, MD       Or  . metoCLOPramide (REGLAN) injection 5-10 mg  5-10 mg Intravenous Q8H PRN Ramond MarrowVarkey, Dax T, MD      . morphine 2 MG/ML injection 0.5 mg  0.5 mg Intravenous Q2H PRN  Eduard ClosKakrakandy, Arshad N, MD   0.5 mg at 06/12/18 1758  . ondansetron (ZOFRAN) tablet 4 mg  4 mg Oral Q6H PRN Ramond MarrowVarkey, Dax T, MD   4 mg at 06/13/18 1756   Or  . ondansetron (ZOFRAN) injection 4 mg  4 mg Intravenous Q6H PRN Ramond MarrowVarkey, Dax T, MD      . oxyCODONE (Oxy IR/ROXICODONE) immediate release tablet 5 mg  5 mg Oral Q4H PRN Bjorn PippinVarkey, Dax T, MD   5 mg at 06/14/18 0848  . senna-docusate (Senokot-S) tablet 2 tablet  2 tablet Oral BID Shon HaleEmokpae, Courage, MD   2 tablet at 06/14/18 0844     Discharge Medications: Please see discharge summary for a list of discharge medications.  Relevant Imaging Results:  Relevant Lab Results:   Additional Information SSN; [id number]  Maree KrabbeBridget A Robbi Scurlock, LCSW

## 2018-06-14 NOTE — Clinical Social Work Note (Signed)
Clinical Social Work Assessment  Patient Details  Name: Rhonda Spears MRN: [id number] Date of Birth: 04/13/1934  Date of referral:  06/14/18               Reason for consult:  Facility Placement                Permission sought to share information with:  Family Supports Permission granted to share information::  Yes, Verbal Permission Granted  Name::     Olegario MessierKathy  [place]::  [facility]  Relationship::  Daughter  Contact Information:  [phone]  Housing/Transportation Living arrangements for the past 2 months:  Single Family Home Source of Information:  Patient Patient Interpreter Needed:  None Criminal Activity/Legal Involvement Pertinent to Current Situation/Hospitalization:  No - Comment as needed Significant Relationships:  Adult Children Lives with:  Self Do you feel safe going back to the place where you live?  No Need for family participation in patient care:  No (Coment)  Care giving concerns:  Pt is alert and oriented.   Social Worker assessment / plan:  CSW spoke with pt at bedside. Pt was independent prior to admission. Pt is agreeable to SNF at d/c. Pt states her daughters [facility] and she would [facility] as she has been there before. CSW will send [facility] [place] and follow up with pt.  Employment status:  Retired Health and safety inspectornsurance information:  Medicare PT Recommendations:  Skilled Nursing Facility Information / Referral to community resources:  Skilled Nursing Facility  Patient/Family's Response to care:  Pt verbalized understanding of CSW role and expressed appreciation for support. Pt denies any concern regarding pt care at this time.   Patient/Family's Understanding of and Emotional Response to Diagnosis, Current Treatment, and Prognosis:  Pt understanding and realistic regarding physical limitations. Pt understands the need for SNF placement at d/c. Pt agreeable to SNF  placement at d/c, at this time. Pt's responses emotionally appropriate during conversation with CSW. Pt denies any concern regarding treatment plan at this time. CSW will continue to provide support and facilitate d/c needs.   Emotional Assessment Appearance:  Appears stated age Attitude/Demeanor/Rapport:  (Patient was appropriate) Affect (typically observed):  Accepting, Appropriate, Calm Orientation:  Oriented to Situation, Oriented to  Time, Oriented to Place, Oriented to Self Alcohol / Substance use:  Not Applicable Psych involvement (Current and /or in the community):  No (Comment)  Discharge Needs  Concerns to be addressed:  Basic Needs Readmission within the last 30 days:  No Current discharge risk:  Dependent with Mobility Barriers to Discharge:  Continued Medical Work up   [employer] MutualBridget A Siyana Erney, LCSW 06/14/2018, 10:33 AM

## 2018-06-21 ENCOUNTER — Ambulatory Visit (HOSPITAL_COMMUNITY): Admission: RE | Admit: 2018-06-21 | Payer: Medicare Other | Source: Ambulatory Visit

## 2018-06-21 ENCOUNTER — Ambulatory Visit: Payer: Self-pay | Admitting: Internal Medicine

## 2018-06-21 ENCOUNTER — Other Ambulatory Visit (HOSPITAL_COMMUNITY): Payer: Self-pay | Admitting: Orthopaedic Surgery

## 2018-06-21 DIAGNOSIS — M79604 Pain in right leg: Secondary | ICD-10-CM

## 2018-06-21 DIAGNOSIS — M7989 Other specified soft tissue disorders: Principal | ICD-10-CM

## 2018-10-28 IMAGING — NM NM MYOCAR MULTI W/SPECT W/WALL MOTION & EF
2 series · 12 of 12 positions shown · non-contrast
Comparison: none

[Series 1: rest · 6.51mm/px · 6 of 64 frames shown]
[frame 6/64]
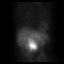
[frame 16/64]
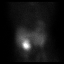
[frame 27/64]
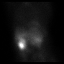
[frame 38/64]
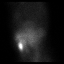
[frame 48/64]
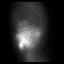
[frame 59/64]
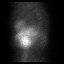

[Series 3: stress gated - perfusion · 6.51mm/px · 6 of 64 frames shown]
[frame 6/64]
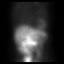
[frame 16/64]
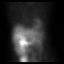
[frame 27/64]
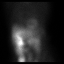
[frame 38/64]
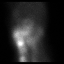
[frame 48/64]
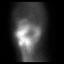
[frame 59/64]
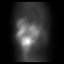

[12 of 12 positions shown; findings below may reference images not displayed]

Canned report from images found in remote index.

Refer to host system for actual result text.

## 2018-11-20 IMAGING — RF DG FEMUR 2+V*R*
1 series · 5 of 5 positions shown · non-contrast
Comparison: Radiographs June 12, 2018.

CLINICAL DATA: Intramedullary rod fixation of right femur.

EXAM:
RIGHT FEMUR 2 VIEWS; DG C-ARM 61-120 MIN
FLUOROSCOPY TIME:  1 minutes 15 seconds.

[Series 1: run · 5 of 5 slices shown]
[im 1/5]
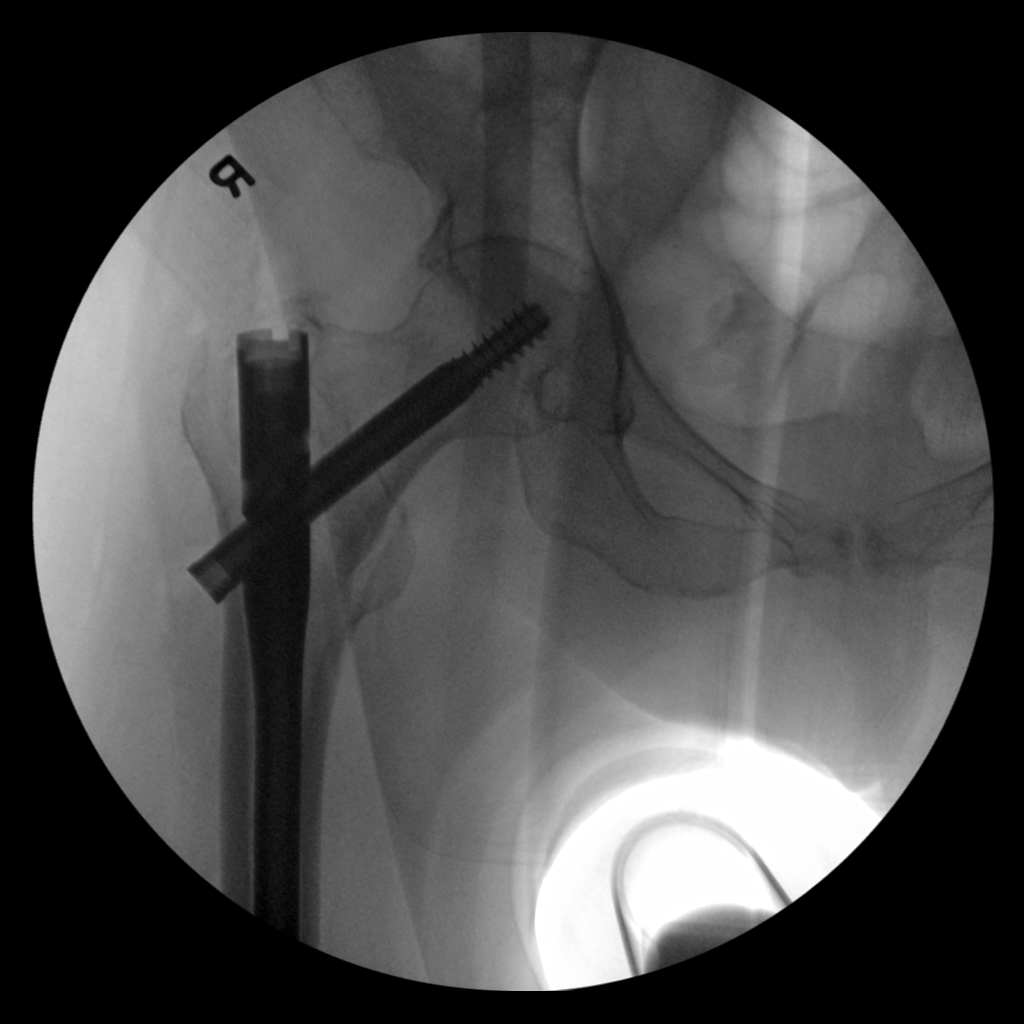
[im 2/5]
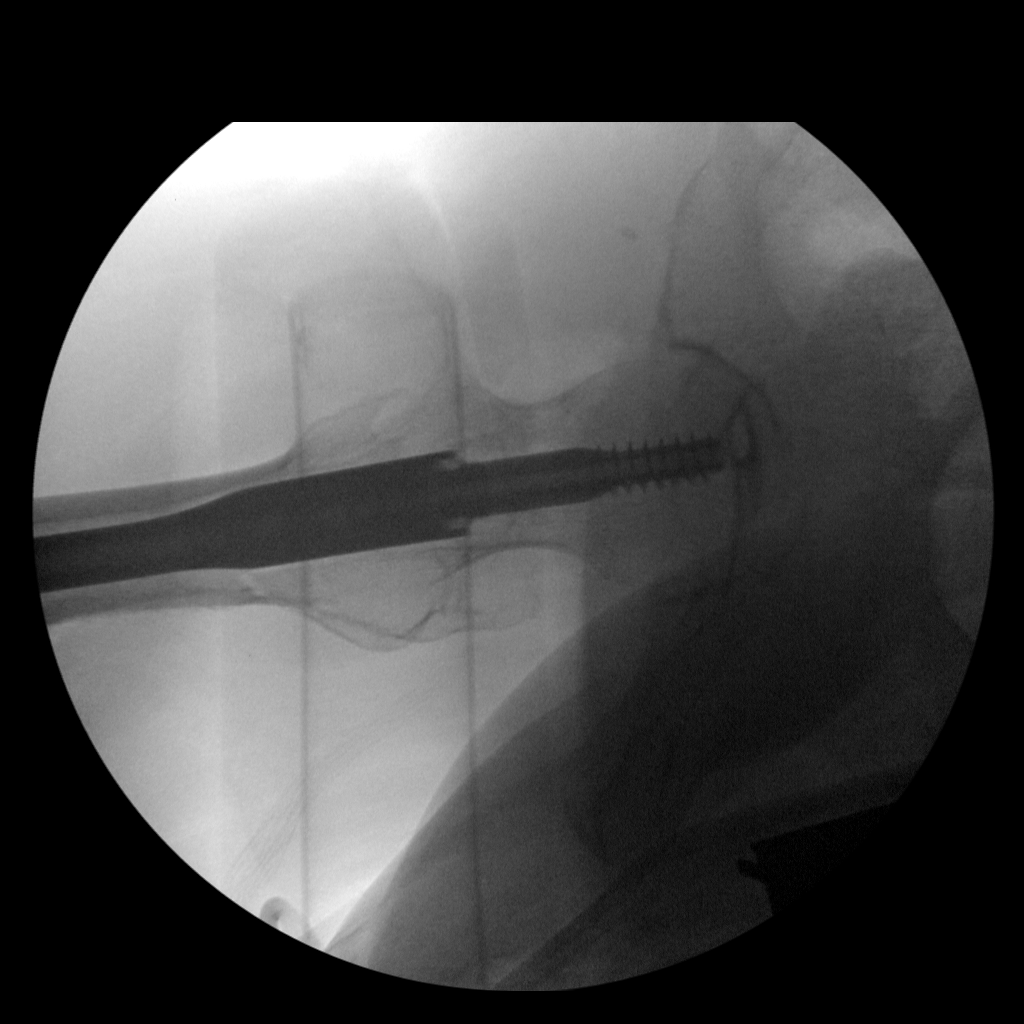
[im 3/5]
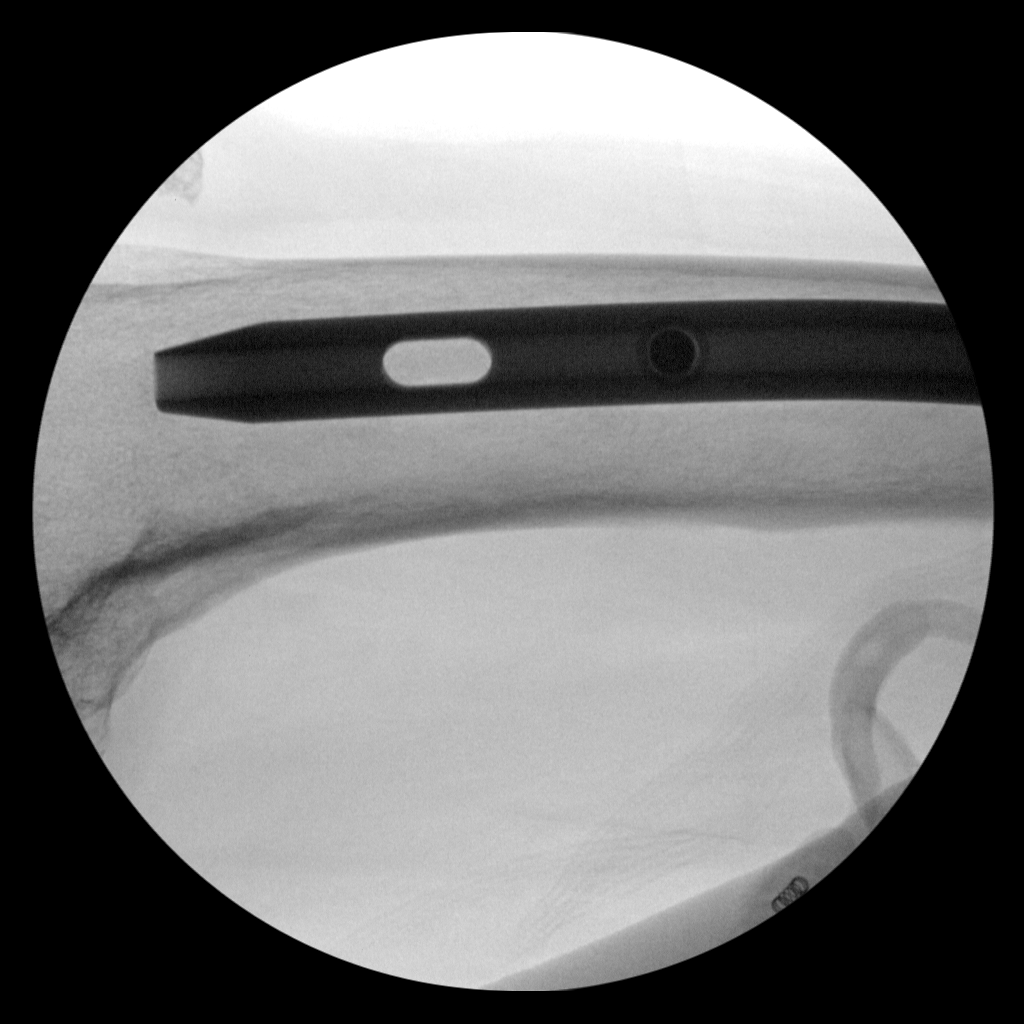
[im 4/5]
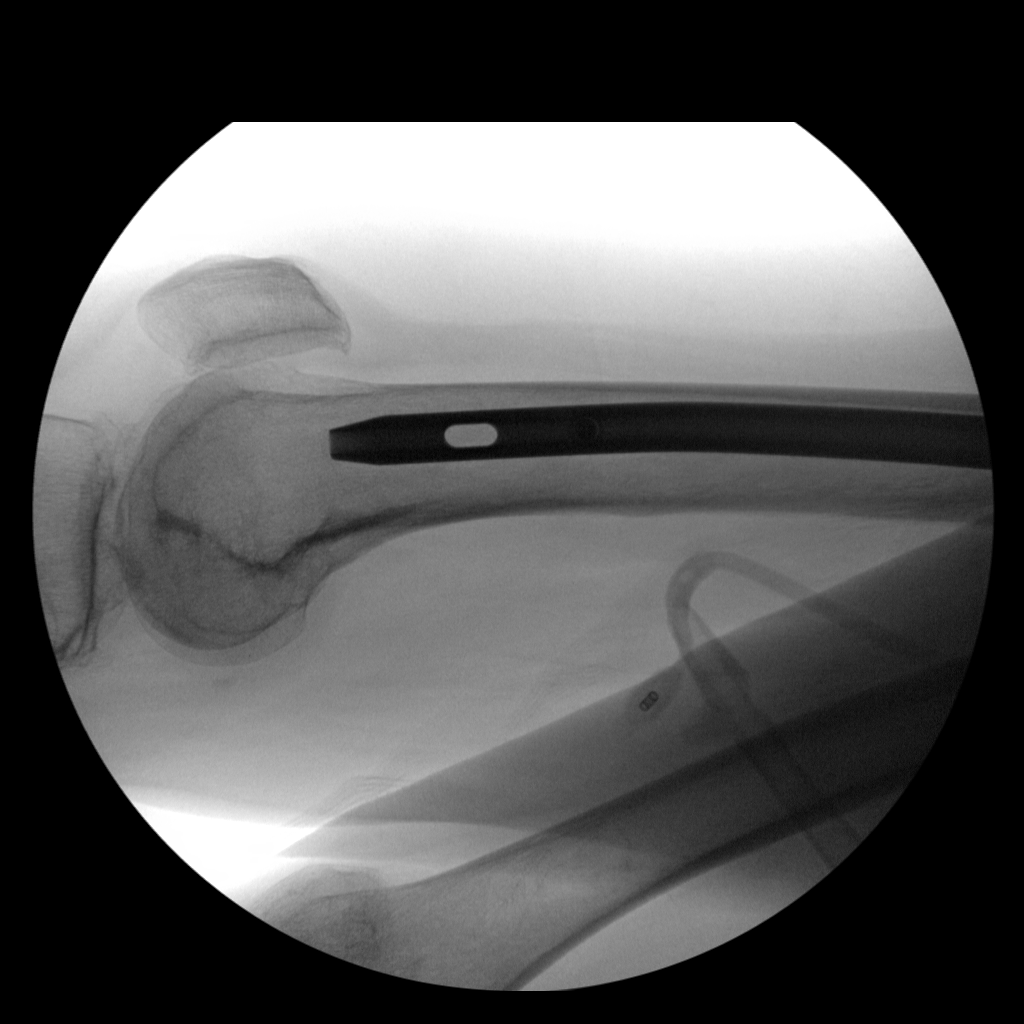
[im 5/5]
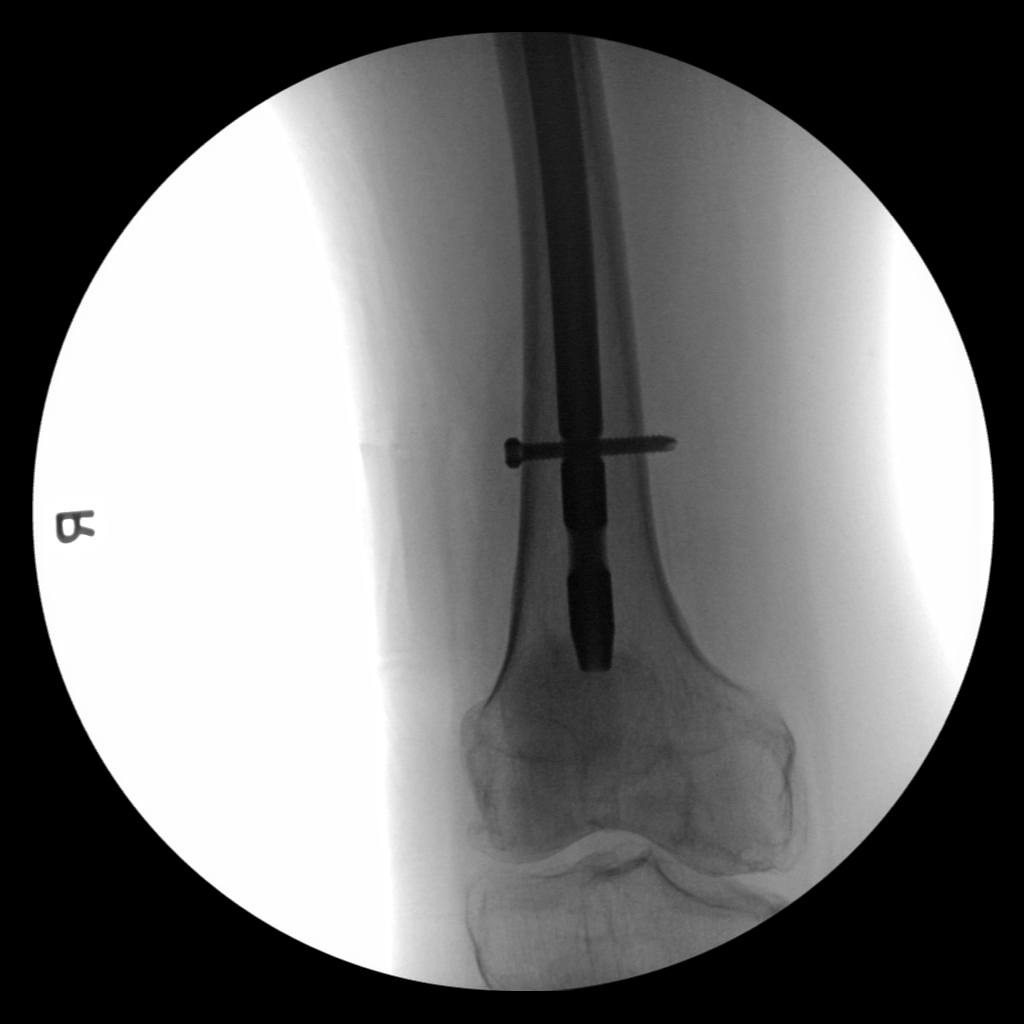

[5 of 5 positions shown; findings below may reference images not displayed]

FINDINGS: Five intraoperative fluoroscopic images of the right femur
demonstrate the patient be status post intramedullary rod fixation
of the right femur and internal fixation of proximal right femoral
intertrochanteric fracture. Good alignment of fracture components is
noted.
IMPRESSION: Status post intramedullary rod fixation of right femur and internal
fixation of proximal right femoral intertrochanteric fracture.

## 2018-12-17 ENCOUNTER — Ambulatory Visit (INDEPENDENT_AMBULATORY_CARE_PROVIDER_SITE_OTHER): Payer: Medicare Other | Admitting: Internal Medicine

## 2018-12-17 ENCOUNTER — Encounter: Payer: Self-pay | Admitting: Internal Medicine

## 2018-12-17 VITALS — BP 122/60 | HR 36 | Ht 64.0 in | Wt 138.0 lb

## 2018-12-17 DIAGNOSIS — R001 Bradycardia, unspecified: Secondary | ICD-10-CM

## 2018-12-17 MED ORDER — AMIODARONE HCL 200 MG PO TABS: 100.0000 mg | ORAL_TABLET | Freq: Two times a day (BID) | ORAL | 3 refills | Status: DC

## 2018-12-17 NOTE — Progress Notes (Signed)
HPI Rhonda Spears returns today for ongoing evaluation and management of symptomatic PVC's and sinus node dysfunction. She is a pleasant 85 yo woman with a h/o HTN and arthritis who I saw over a year ago. She has had worsening fatigue and malaise. Her palpitations have remained. No syncope. She has been bradycardic.  Allergies  Allergen Reactions  . Latex Itching  . Omeprazole Palpitations  . Tramadol Itching and Nausea Only  . Lasix [Furosemide] Other (See Comments)    SEVERE HEADACHE  . Sulfa Antibiotics Nausea Only and Other (See Comments)    Severe headaches  . Zanaflex [Tizanidine Hcl] Itching and Other (See Comments)    oversedation     Current Outpatient Medications  Medication Sig Dispense Refill  . allopurinol (ZYLOPRIM) 100 MG tablet Take 200 mg by mouth at bedtime.     Marland Kitchen atenolol (TENORMIN) 25 MG tablet Take 0.5 tablets (12.5 mg total) by mouth daily. Hold for heart rate less than 60 bpm or systolic blood pressure less than 100 mmhg 15 tablet 1  . Calcium Carb-Cholecalciferol (CALCIUM-VITAMIN D) 500-200 MG-UNIT tablet Take 1 tablet by mouth 3 (three) times daily. 90 tablet 1  . cholecalciferol (VITAMIN D) 1000 units tablet Take 1,000 Units by mouth every morning.     . Multiple Vitamin (MULTIVITAMIN) capsule Take 1 capsule by mouth daily.    . ondansetron (ZOFRAN) 4 MG tablet Take 1 tablet (4 mg total) by mouth every 6 (six) hours as needed for nausea or vomiting. 20 tablet 0  . oxyCODONE (OXY IR/ROXICODONE) 5 MG immediate release tablet Take 1 tablet (5 mg total) by mouth every 4 (four) hours as needed for moderate pain or severe pain. 14 tablet 0  . senna-docusate (SENOKOT-S) 8.6-50 MG tablet Take 2 tablets by mouth 2 (two) times daily. 120 tablet 2   No current facility-administered medications for this visit.      Past Medical History:  Diagnosis Date  . Chest pain with moderate risk for cardiac etiology 03/30/2017  . CKD (chronic kidney disease), stage III  (HCC)   . COPD (chronic obstructive pulmonary disease) (HCC)   . Gout   . Hypertension   . PVC's (premature ventricular contractions)   . Renal disorder   . Sinus bradycardia   . Tricuspid regurgitation   . Venous insufficiency     ROS:   All systems reviewed and negative except as noted in the HPI.   Past Surgical History:  Procedure Laterality Date  . arthroscopic left knee    . INTRAMEDULLARY (IM) NAIL INTERTROCHANTERIC Right 06/12/2018   Procedure: INTRAMEDULLARY (IM) NAIL INTERTROCHANTRIC;  Surgeon: Bjorn Pippin, MD;  Location: [facility] OR;  Service: Orthopedics;  Laterality: Right;  . repair left femoral neck    . right medial men       Family History  Problem Relation Age of Onset  . Heart attack Mother   . Hypertension Mother   . Early death Father 68       Car accident     Social History   Socioeconomic History  . Marital status: Divorced    Spouse name: Not on file  . Number of children: Not on file  . Years of education: Not on file  . Highest education level: Not on file  Occupational History  . Occupation: Retired  Engineer, production  . Financial resource strain: Not on file  . Food insecurity:    Worry: Not on file    Inability: Not on  file  . Transportation needs:    Medical: Not on file    Non-medical: Not on file  Tobacco Use  . Smoking status: Never Smoker  . Smokeless tobacco: Never Used  Substance and Sexual Activity  . Alcohol use: No  . Drug use: No  . Sexual activity: Not on file  Lifestyle  . Physical activity:    Days per week: Not on file    Minutes per session: Not on file  . Stress: Not on file  Relationships  . Social connections:    Talks on phone: Not on file    Gets together: Not on file    Attends religious service: Not on file    Active member of club or organization: Not on file    Attends meetings of clubs or organizations: Not on file    Relationship status: Not on file  . Intimate partner violence:    Fear of current  or ex partner: Not on file    Emotionally abused: Not on file    Physically abused: Not on file    Forced sexual activity: Not on file  Other Topics Concern  . Not on file  Social History Narrative   Pt lives alone in [place], [place]     BP 122/60   Pulse (!) 36   Ht 5\' 4"  (1.626 m)   Wt 138 lb (62.6 kg)   SpO2 95%   BMI 23.69 kg/m   Physical Exam:  Well appearing 85 yo woman, NAD HEENT: Unremarkable Neck:  No JVD, no thyromegally Lymphatics:  No adenopathy Back:  No CVA tenderness Lungs:  Clear with no wheezes HEART:  IRegular brady rhythm, no murmurs, no rubs, no clicks Abd:  soft, positive bowel sounds, no organomegally, no rebound, no guarding Ext:  2 plus pulses, no edema, no cyanosis, no clubbing Skin:  No rashes no nodules Neuro:  CN II through XII intact, motor grossly intact  EKG - nsr with bigeminal PVC's   Assess/Plan: 1. PVC's - she is in ventricular bigeminy today. I have recommended she switch from atenolol to amiodarone. 2. Sinus node dysfunction - she is minimally symptomatic. Hopefully she will not develop more bradycardia on amiodarone. We discussed PPM insertion.  3. HTN - her blood pressure is well controlled.  4. Venous insufficiency - she has minimal peripheral edema. She does have some varicose veins.   Rhonda Spears.D.

## 2018-12-17 NOTE — Patient Instructions (Addendum)
Medication Instructions:  Your physician has recommended you make the following change in your medication:  Stop Taking Atenolol  Start Taking Amiodarone 100 mg Two Times Daily   If you need a refill on your cardiac medications before your next appointment, please call your pharmacy.   Lab work: NONE  If you have labs (blood work) drawn today and your tests are completely normal, you will receive your results only by: Marland Kitchen MyChart Message (if you have MyChart) OR . A paper copy in the mail If you have any lab test that is abnormal or we need to change your treatment, we will call you to review the results.  Testing/Procedures: NONE   Follow-Up: At [facility], you and your health needs are our priority.  As part of our continuing mission to provide you with exceptional heart care, we have created designated Provider Care Teams.  These Care Teams include your primary Cardiologist (physician) and Advanced Practice Providers (APPs -  Physician Assistants and Nurse Practitioners) who all work together to provide you with the care you need, when you need it. You will need a follow up appointment in 4-5 weeks.  Please call our office 2 months in advance to schedule this appointment.  You may see Lewayne Bunting, MD or one of the following Advanced Practice Providers on your designated Care Team:   Gypsy Balsam, NP . Francis Dowse, PA-C  Any Other Special Instructions Will Be Listed Below (If Applicable). Thank you for choosing [facility]!

## 2018-12-23 ENCOUNTER — Encounter: Payer: Self-pay | Admitting: Internal Medicine

## 2018-12-23 ENCOUNTER — Telehealth: Payer: Self-pay | Admitting: Internal Medicine

## 2018-12-23 NOTE — Telephone Encounter (Signed)
Please call pt concerning BP readings and new med changes from Dr. Ladona Ridgel.

## 2018-12-24 NOTE — Telephone Encounter (Signed)
Returned pt call. She wanted to inform Dr. Ladona Ridgel that since her medication changes on LOV, she has had a headache and her blood pressure is going up a little.(140/86,131/360,124/92)   She takes it at different times, with no specific reason.I asked her to take it for next three days (two hours after taking medication)  and call Friday with readings.  She voiced understanding & Is willing to work through the side effects of the medication.

## 2019-01-23 ENCOUNTER — Ambulatory Visit: Payer: Self-pay | Admitting: Internal Medicine

## 2019-03-13 ENCOUNTER — Telehealth: Payer: Self-pay | Admitting: Internal Medicine

## 2019-03-13 NOTE — Telephone Encounter (Signed)
Virtual Visit Pre-Appointment Phone Call  "(Name), I am calling you today to discuss your upcoming appointment. We are currently trying to limit exposure to the virus that causes COVID-19 by seeing patients at home rather than in the office."  1. "What is the BEST phone number to call the day of the visit?" - include this in appointment notes  2. Do you have or have access to (through a family member/friend) a smartphone with video capability that we can use for your visit?" a. If yes - list this number in appt notes as cell (if different from BEST phone #) and list the appointment type as a VIDEO visit in appointment notes b. If no - list the appointment type as a PHONE visit in appointment notes  3. Confirm consent - "In the setting of the current Covid19 crisis, you are scheduled for a (phone or video) visit with your provider on (date) at (time).  Just as we do with many in-office visits, in order for you to participate in this visit, we must obtain consent.  If you'd like, I can send this to your mychart (if signed up) or email for you to review.  Otherwise, I can obtain your verbal consent now.  All virtual visits are billed to your insurance company just like a normal visit would be.  By agreeing to a virtual visit, we'd like you to understand that the technology does not allow for your provider to perform an examination, and thus may limit your provider's ability to fully assess your condition. If your provider identifies any concerns that need to be evaluated in person, we will make arrangements to do so.  Finally, though the technology is pretty good, we cannot assure that it will always work on either your or our end, and in the setting of a video visit, we may have to convert it to a phone-only visit.  In either situation, we cannot ensure that we have a secure connection.  Are you willing to proceed?" STAFF: Did the patient verbally acknowledge consent to telehealth visit? Document  YES/NO here: Yes  4. Advise patient to be prepared - "Two hours prior to your appointment, go ahead and check your blood pressure, pulse, oxygen saturation, and your weight (if you have the equipment to check those) and write them all down. When your visit starts, your provider will ask you for this information. If you have an Apple Watch or Kardia device, please plan to have heart rate information ready on the day of your appointment. Please have a pen and paper handy nearby the day of the visit as well."  5. Give patient instructions for MyChart download to smartphone OR [email or web address].me as below if video visit (depending on what platform provider is using)  6. Inform patient they will receive a phone call 15 minutes prior to their appointment time (may be from unknown caller ID) so they should be prepared to answer    TELEPHONE CALL NOTE  Rhonda Spears has been deemed a candidate for a follow-up tele-health visit to limit community exposure during the Covid-19 pandemic. I spoke with the patient via phone to ensure availability of phone/video source, confirm preferred email & phone number, and discuss instructions and expectations.  I reminded Rhonda Spears to be prepared with any vital sign and/or heart rhythm information that could potentially be obtained via home monitoring, at the time of her visit. I reminded Rhonda Spears to expect a phone call prior to  her visit.  Terry L Goins 03/13/2019 1:28 PM

## 2019-03-20 ENCOUNTER — Encounter: Payer: Self-pay | Admitting: Internal Medicine

## 2019-03-21 ENCOUNTER — Telehealth (INDEPENDENT_AMBULATORY_CARE_PROVIDER_SITE_OTHER): Payer: Medicare Other | Admitting: Internal Medicine

## 2019-03-21 VITALS — BP 122/54 | HR 58 | Ht 64.0 in | Wt 135.0 lb

## 2019-03-21 DIAGNOSIS — I10 Essential (primary) hypertension: Secondary | ICD-10-CM

## 2019-03-21 DIAGNOSIS — I495 Sick sinus syndrome: Secondary | ICD-10-CM

## 2019-03-21 DIAGNOSIS — I493 Ventricular premature depolarization: Secondary | ICD-10-CM | POA: Diagnosis not present

## 2019-03-21 NOTE — Progress Notes (Signed)
Electrophysiology TeleHealth Note   Due to national recommendations of social distancing due to COVID 19, an audio/video telehealth visit is felt to be most appropriate for this patient at this time.  See MyChart message from today for the patient's consent to telehealth for [facility].   Date:  03/21/2019   ID:  Rhonda Spears, DOB 08/16/1934, MRN [id number]  Location: patient's home  Provider location: [address], [place]  Evaluation Performed: Follow-up visit  PCP:  Kela MillinBarrino, Alethea Y, MD  Cardiologist:  No primary care provider on file. Electrophysiologist:  Dr Ladona Ridgelaylor  Chief Complaint:  "I am still coughing since Thanksgiving."  History of Present Illness:    Rhonda DadaYvonne L Nack is a 85 y.o. female who presents via audio/video conferencing for a telehealth visit today. She has a h/o PVC"s and was placed on amiodarone and the atenolol. She also has venous insufficiency. Her bp has been in the 130-160.  Since last being seen in our clinic, the patient reports doing very well.  Today, she denies symptoms of palpitations, chest pain, shortness of breath, or syncope.  The patient is otherwise without complaint today.  The patient denies symptoms of fevers, chills, cough, or new SOB worrisome for COVID 19.  Past Medical History:  Diagnosis Date  . Chest pain with moderate risk for cardiac etiology 03/30/2017  . CKD (chronic kidney disease), stage III (HCC)   . COPD (chronic obstructive pulmonary disease) (HCC)   . Gout   . Hypertension   . PVC's (premature ventricular contractions)   . Renal disorder   . Sinus bradycardia   . Tricuspid regurgitation   . Venous insufficiency     Past Surgical History:  Procedure Laterality Date  . arthroscopic left knee    . INTRAMEDULLARY (IM) NAIL INTERTROCHANTERIC Right 06/12/2018   Procedure: INTRAMEDULLARY (IM) NAIL INTERTROCHANTRIC;  Surgeon: Bjorn PippinVarkey, Dax T, MD;  Location: [facility] OR;  Service: Orthopedics;  Laterality:  Right;  . repair left femoral neck    . right medial men      Current Outpatient Medications  Medication Sig Dispense Refill  . allopurinol (ZYLOPRIM) 100 MG tablet Take 200 mg by mouth at bedtime.     Marland Kitchen. amiodarone (PACERONE) 200 MG tablet Take 0.5 tablets (100 mg total) by mouth 2 (two) times daily. 90 tablet 3  . Calcium Carb-Cholecalciferol (CALCIUM-VITAMIN D) 500-200 MG-UNIT tablet Take 1 tablet by mouth 3 (three) times daily. 90 tablet 1  . cholecalciferol (VITAMIN D) 1000 units tablet Take 1,000 Units by mouth every morning.     . folic acid (FOLVITE) 400 MCG tablet Take 400 mcg by mouth daily.    . Magnesium 250 MG TABS Take 250 mg by mouth daily.    . montelukast (SINGULAIR) 10 MG tablet Take 10 mg by mouth at bedtime.     . Multiple Vitamin (MULTIVITAMIN) capsule Take 1 capsule by mouth daily.    Marland Kitchen. oxyCODONE (OXY IR/ROXICODONE) 5 MG immediate release tablet Take 1 tablet (5 mg total) by mouth every 4 (four) hours as needed for moderate pain or severe pain. 14 tablet 0  . potassium chloride (KLOR-CON) 20 MEQ packet Take 20 mEq by mouth as directed. Takes twice a week    . senna-docusate (SENOKOT-S) 8.6-50 MG tablet Take 2 tablets by mouth 2 (two) times daily. 120 tablet 2   No current facility-administered medications for this visit.     Allergies:   Latex; Omeprazole; Tramadol; Lasix [furosemide]; Sulfa antibiotics; and  Zanaflex [tizanidine hcl]   Social History:  The patient  reports that she has never smoked. She has never used smokeless tobacco. She reports that she does not drink alcohol or use drugs.   Family History:  The patient's  family history includes Early death (age of onset: 58) in her father; Heart attack in her mother; Hypertension in her mother.   ROS:  Please see the history of present illness.   All other systems are personally reviewed and negative.    Exam:    Vital Signs:  BP (!) 122/54   Pulse (!) 58   Ht 5\' 4"  (1.626 m)   Wt 135 lb (61.2 kg)   BMI  23.17 kg/m    Labs/Other Tests and Data Reviewed:    Recent Labs: 05/10/2018: B Natriuretic Peptide 207.0; Magnesium 2.2 06/12/2018: ALT 17 06/14/2018: BUN 17; Creatinine, Ser 1.05; Hemoglobin 10.1; Platelets 138; Potassium 4.0; Sodium 136   Wt Readings from Last 3 Encounters:  03/20/19 135 lb (61.2 kg)  12/17/18 138 lb (62.6 kg)  06/12/18 144 lb 6.4 oz (65.5 kg)     Other studies personally reviewed: Additional studies/ records that were reviewed today include:    ASSESSMENT & PLAN:    1.  HTN - her pressures have been a little high. I asked her to observe and to call if the systolic is frequently over 150. She is encouraged to reduce her salt intake. 2. PVC's - she thinks that her symptoms are better on low dose amio. I am incline to continue.  3. Sinus node dysfunction - she is asymptomatic.  4. COVID 19 screen The patient denies symptoms of COVID 19 at this time.  The importance of social distancing was discussed today.  Follow-up:  2-3 months Next remote: n/a  Current medicines are reviewed at length with the patient today.   The patient does not have concerns regarding her medicines.  The following changes were made today:  none  Labs/ tests ordered today include: none No orders of the defined types were placed in this encounter.    Patient Risk:  after full review of this patients clinical status, I feel that they are at moderate risk at this time.  Today, I have spent 20 minutes with the patient with telehealth technology discussing all of the above .    Signed, Lewayne Bunting, MD  03/21/2019 1:44 PM     [facility] [address] [place] [address] [phone] (office) [phone] (fax)

## 2019-03-21 NOTE — Patient Instructions (Signed)
Medication Instructions: Your physician recommends that you continue on your current medications as directed. Please refer to the Current Medication list given to you today.   Labwork: None today  Procedures/Testing: None today  Follow-Up: 2-3 months with Dr.Taylor  Any Additional Special Instructions Will Be Listed Below (If Applicable).  Please watch your sodium intake  Please call us if your systolic (top number) blood pressure reading is over 150    If you need a refill on your cardiac medications before your next appointment, please call your pharmacy.      Thank you for choosing [facility] !

## 2019-04-14 ENCOUNTER — Other Ambulatory Visit (HOSPITAL_COMMUNITY): Payer: Self-pay | Admitting: Family Medicine

## 2019-04-15 ENCOUNTER — Other Ambulatory Visit (HOSPITAL_COMMUNITY): Payer: Self-pay | Admitting: Family Medicine

## 2019-04-15 DIAGNOSIS — G44009 Cluster headache syndrome, unspecified, not intractable: Secondary | ICD-10-CM

## 2019-04-15 DIAGNOSIS — R221 Localized swelling, mass and lump, neck: Secondary | ICD-10-CM

## 2019-04-15 DIAGNOSIS — R911 Solitary pulmonary nodule: Secondary | ICD-10-CM

## 2019-04-15 DIAGNOSIS — R9389 Abnormal findings on diagnostic imaging of other specified body structures: Secondary | ICD-10-CM

## 2019-04-21 ENCOUNTER — Ambulatory Visit (HOSPITAL_COMMUNITY)
Admission: RE | Admit: 2019-04-21 | Discharge: 2019-04-21 | Disposition: A | Discharge status: Home / Self Care | Payer: Medicare Other | Source: Ambulatory Visit | Attending: Family Medicine | Admitting: Family Medicine

## 2019-04-21 ENCOUNTER — Other Ambulatory Visit: Payer: Self-pay

## 2019-04-21 DIAGNOSIS — R221 Localized swelling, mass and lump, neck: Secondary | ICD-10-CM | POA: Diagnosis present

## 2019-04-21 DIAGNOSIS — R911 Solitary pulmonary nodule: Secondary | ICD-10-CM | POA: Diagnosis not present

## 2019-04-21 DIAGNOSIS — G44009 Cluster headache syndrome, unspecified, not intractable: Secondary | ICD-10-CM | POA: Diagnosis present

## 2019-04-21 DIAGNOSIS — R9389 Abnormal findings on diagnostic imaging of other specified body structures: Secondary | ICD-10-CM | POA: Diagnosis present

## 2019-04-21 MED ORDER — FLUDEOXYGLUCOSE F - 18 (FDG) INJECTION
8.7100 | Freq: Once | INTRAVENOUS | Status: AC | PRN
  Administered 2019-04-21: 8.71 via INTRAVENOUS

## 2019-05-05 ENCOUNTER — Ambulatory Visit (INDEPENDENT_AMBULATORY_CARE_PROVIDER_SITE_OTHER): Payer: Medicare Other | Admitting: Otolaryngology

## 2019-05-05 DIAGNOSIS — R05 Cough: Secondary | ICD-10-CM | POA: Diagnosis not present

## 2019-05-05 DIAGNOSIS — K219 Gastro-esophageal reflux disease without esophagitis: Secondary | ICD-10-CM | POA: Diagnosis not present

## 2019-05-21 ENCOUNTER — Ambulatory Visit (INDEPENDENT_AMBULATORY_CARE_PROVIDER_SITE_OTHER): Payer: Medicare Other | Admitting: Internal Medicine

## 2019-05-21 ENCOUNTER — Encounter: Payer: Self-pay | Admitting: Internal Medicine

## 2019-05-21 ENCOUNTER — Other Ambulatory Visit: Payer: Self-pay

## 2019-05-21 VITALS — BP 132/78 | HR 67 | Ht 61.0 in | Wt 140.0 lb

## 2019-05-21 DIAGNOSIS — I493 Ventricular premature depolarization: Secondary | ICD-10-CM

## 2019-05-21 DIAGNOSIS — I495 Sick sinus syndrome: Secondary | ICD-10-CM

## 2019-05-21 DIAGNOSIS — I10 Essential (primary) hypertension: Secondary | ICD-10-CM | POA: Diagnosis not present

## 2019-05-21 MED ORDER — FUROSEMIDE 20 MG PO TABS: 20.0000 mg | ORAL_TABLET | Freq: Every day | ORAL | 3 refills | Status: AC

## 2019-05-21 MED ORDER — AMIODARONE HCL 200 MG PO TABS: 100.0000 mg | ORAL_TABLET | Freq: Every day | ORAL | 3 refills | Status: DC

## 2019-05-21 NOTE — Progress Notes (Signed)
HPI [name] returns today for followup. She is a pleasant 85 yo woman with a h/o PVC's, COPD, and diastolic heart failure. She was placed on amiodarone and her PVC's improved. She c/o sob today with no chest pain. She denies palpitations. Minimal edema.  No fever or chills.  Allergies  Allergen Reactions  . Latex Itching  . Omeprazole Palpitations  . Tramadol Itching and Nausea Only  . Lasix [Furosemide] Other (See Comments)    SEVERE HEADACHE  . Sulfa Antibiotics Nausea Only and Other (See Comments)    Severe headaches  . Zanaflex [Tizanidine Hcl] Itching and Other (See Comments)    oversedation     Current Outpatient Medications  Medication Sig Dispense Refill  . allopurinol (ZYLOPRIM) 100 MG tablet Take 200 mg by mouth at bedtime.     Marland Kitchen. amiodarone (PACERONE) 200 MG tablet Take 0.5 tablets (100 mg total) by mouth daily. 45 tablet 3  . Calcium Carb-Cholecalciferol (CALCIUM-VITAMIN D) 500-200 MG-UNIT tablet Take 1 tablet by mouth 3 (three) times daily. 90 tablet 1  . cholecalciferol (VITAMIN D) 1000 units tablet Take 1,000 Units by mouth every morning.     . folic acid (FOLVITE) 400 MCG tablet Take 400 mcg by mouth daily.    . Magnesium 250 MG TABS Take 250 mg by mouth daily.    . montelukast (SINGULAIR) 10 MG tablet Take 10 mg by mouth at bedtime.     . Multiple Vitamin (MULTIVITAMIN) capsule Take 1 capsule by mouth daily.    Marland Kitchen. oxyCODONE (OXY IR/ROXICODONE) 5 MG immediate release tablet Take 1 tablet (5 mg total) by mouth every 4 (four) hours as needed for moderate pain or severe pain. 14 tablet 0  . potassium chloride (KLOR-CON) 20 MEQ packet Take 20 mEq by mouth as directed. Takes twice a week    . senna-docusate (SENOKOT-S) 8.6-50 MG tablet Take 2 tablets by mouth 2 (two) times daily. 120 tablet 2  . furosemide (LASIX) 20 MG tablet Take 1 tablet (20 mg total) by mouth daily. 90 tablet 3   No current facility-administered medications for this visit.      Past  Medical History:  Diagnosis Date  . Chest pain with moderate risk for cardiac etiology 03/30/2017  . CKD (chronic kidney disease), stage III (HCC)   . COPD (chronic obstructive pulmonary disease) (HCC)   . Gout   . Hypertension   . PVC's (premature ventricular contractions)   . Renal disorder   . Sinus bradycardia   . Tricuspid regurgitation   . Venous insufficiency     ROS:   All systems reviewed and negative except as noted in the HPI.   Past Surgical History:  Procedure Laterality Date  . arthroscopic left knee    . INTRAMEDULLARY (IM) NAIL INTERTROCHANTERIC Right 06/12/2018   Procedure: INTRAMEDULLARY (IM) NAIL INTERTROCHANTRIC;  Surgeon: Bjorn PippinVarkey, Dax T, MD;  Location: [facility] OR;  Service: Orthopedics;  Laterality: Right;  . repair left femoral neck    . right medial men       Family History  Problem Relation Age of Onset  . Heart attack Mother   . Hypertension Mother   . Early death Father [id number]       Car accident     Social History   Socioeconomic History  . Marital status: Divorced    Spouse name: Not on file  . Number of children: Not on file  . Years of education: Not on file  . Highest education  level: Not on file  Occupational History  . Occupation: Retired  Scientific laboratory technician  . Financial resource strain: Not on file  . Food insecurity    Worry: Not on file    Inability: Not on file  . Transportation needs    Medical: Not on file    Non-medical: Not on file  Tobacco Use  . Smoking status: Never Smoker  . Smokeless tobacco: Never Used  Substance and Sexual Activity  . Alcohol use: No  . Drug use: No  . Sexual activity: Not on file  Lifestyle  . Physical activity    Days per week: Not on file    Minutes per session: Not on file  . Stress: Not on file  Relationships  . Social Herbalist on phone: Not on file    Gets together: Not on file    Attends religious service: Not on file    Active member of club or organization: Not on file     Attends meetings of clubs or organizations: Not on file    Relationship status: Not on file  . Intimate partner violence    Fear of current or ex partner: Not on file    Emotionally abused: Not on file    Physically abused: Not on file    Forced sexual activity: Not on file  Other Topics Concern  . Not on file  Social History Narrative   Pt lives alone in [place], [place]     BP 132/78 (BP Location: Left Arm)   Pulse 67   Ht 5\' 1"  (1.549 m)   Wt 140 lb (63.5 kg)   SpO2 94%   BMI 26.45 kg/m   Physical Exam:  Well appearing NAD HEENT: Unremarkable Neck:  No JVD, no thyromegally Lymphatics:  No adenopathy Back:  No CVA tenderness Lungs:  Clear with minimal basilar rales HEART:  Regular rate rhythm, no murmurs, no rubs, no clicks Abd:  soft, positive bowel sounds, no organomegally, no rebound, no guarding Ext:  2 plus pulses, no edema, no cyanosis, no clubbing Skin:  No rashes no nodules Neuro:  CN II through XII intact, motor grossly intact  DEVICE  Normal device function.  See PaceArt for details.   Assess/Plan: 1. PVC's - she appears to be well controlled. I have asked her to reduce her dose of amiodarone to 100 mg daily. 2. Dyspnea - I have asked her to take lasix daily. If her breathing is not improved in 2 weeks then she is instructed to stop lasix.  3. Sinus node dysfunction - she is asymptomatic  4. HTN - her bp is well controlled. No change in meds except as outlined above.  Mikle Bosworth.D.

## 2019-05-21 NOTE — Patient Instructions (Signed)
Medication Instructions:  TAKE LASIX 20 MG DAILY   DECREASE AMIODARONE TO 100 MG DAILY ( 1/2 TABLET)   Labwork: NONE  Testing/Procedures: NONE  Follow-Up: Your physician wants you to follow-up in: 6 MONTHS.  You will receive a reminder letter in the mail two months in advance. If you don't receive a letter, please call our office to schedule the follow-up appointment.   Any Other Special Instructions Will Be Listed Below (If Applicable).     If you need a refill on your cardiac medications before your next appointment, please call your pharmacy.

## 2019-06-16 ENCOUNTER — Ambulatory Visit (INDEPENDENT_AMBULATORY_CARE_PROVIDER_SITE_OTHER): Payer: Medicare Other | Admitting: Otolaryngology

## 2019-06-16 DIAGNOSIS — R05 Cough: Secondary | ICD-10-CM

## 2019-06-16 DIAGNOSIS — K219 Gastro-esophageal reflux disease without esophagitis: Secondary | ICD-10-CM

## 2019-06-23 ENCOUNTER — Telehealth: Payer: Self-pay | Admitting: *Deleted

## 2019-06-23 NOTE — Telephone Encounter (Signed)
Pt notified that DMV Disabled parking placard has been completed and mailed to pt.

## 2019-06-27 ENCOUNTER — Encounter (HOSPITAL_COMMUNITY): Payer: Self-pay

## 2019-06-30 ENCOUNTER — Encounter (HOSPITAL_COMMUNITY): Payer: Self-pay | Admitting: Hematology

## 2019-06-30 ENCOUNTER — Other Ambulatory Visit: Payer: Self-pay

## 2019-06-30 ENCOUNTER — Inpatient Hospital Stay (HOSPITAL_COMMUNITY): Payer: Medicare Other | Attending: Hematology | Admitting: Hematology

## 2019-06-30 VITALS — BP 165/82 | HR 65 | Temp 97.1°F | Resp 22 | Ht 62.5 in | Wt 140.0 lb

## 2019-06-30 DIAGNOSIS — R911 Solitary pulmonary nodule: Secondary | ICD-10-CM | POA: Insufficient documentation

## 2019-06-30 NOTE — Patient Instructions (Addendum)
[facility] Discharge Instructions  You were seen today by Dr. Delton Coombes. He went over your history, family history and how you've been feeling lately.  We will also do pulmonary function testing. You will have to be covid tested prior to this test.  We will refer you to radiation in [facility] at [facility] with Dr. Frances Maywood.  They will call you with that appointment.  We will see you back after scans and biopsy.   Thank you for choosing [employer] at [facility] to provide your oncology and hematology care.  To afford each patient quality time with our provider, please arrive at least 15 minutes before your scheduled appointment time.   If you have a lab appointment with the [facility] please come in thru the  Main Entrance and check in at the main information desk  You need to re-schedule your appointment should you arrive 10 or more minutes late.  We strive to give you quality time with our providers, and arriving late affects you and other patients whose appointments are after yours.  Also, if you no show three or more times for appointments you may be dismissed from the clinic at the providers discretion.     Again, thank you for choosing [facility].  Our hope is that these requests will decrease the amount of time that you wait before being seen by our physicians.       _____________________________________________________________  Should you have questions after your visit to [facility], please contact our office at [phone] between the hours of 8:00 a.m. and 4:30 p.m.  Voicemails left after 4:00 p.m. will not be returned until the following business day.  For prescription refill requests, have your pharmacy contact our office and allow 72 hours.    Cancer Center Support Programs:   > Cancer Support Group  2nd Tuesday of the month 1pm-2pm, Journey Room

## 2019-06-30 NOTE — Assessment & Plan Note (Addendum)
1.  Right lower lobe lung nodule: - Patient seen by Dr. Benjamine Mola for evaluation of chronic cough since November 2019. -A CT of the neck and chest on 04/14/2019 done at [facility] showed right lung nodule.  Patient is a never smoker. -PET CT scan on 04/21/2019 showed 1.5 x 1.2 cm (SUV 8.9) Right lower lobe lung nodule in the superior segment.  There is some indistinctly marginated left hilar activity with maximum SUV of 3.6. - Flexible laryngoscopy on 06/16/2019 showed no lesions in the nasopharynx, oropharynx, hypopharynx, larynx.  Base of tongue was within normal limits. - Patient was reluctant to consider any form of therapy after the PET CT scan.  Today she wants to know her options. - I have recommended doing brain scan to complete work-up.  We will also refer her for CT-guided biopsy of the lung nodule. - We talked about surgical resection option.  She does not want to have surgery done as she had problems with anesthesia the last 2 times.  The next best option would be radiation therapy, likely SBRT. - We will also order PFTs.  I will see her back after the biopsy to discuss various options.

## 2019-06-30 NOTE — Progress Notes (Signed)
  Oncology Nurse Navigator Documentation  Navigator Location: [facility] (06/30/19 1600) Referral Date to RadOnc/MedOnc: 06/30/19 (06/30/19 1600) )Navigator Encounter Type: Initial MedOnc (06/30/19 1600) Patient Visit Type: Initial (06/30/19 1600) Treatment Phase: Abnormal Scans (06/30/19 1600) Barriers/Navigation Needs: Coordination of Care;Education (06/30/19 1600) Education: Coping with Diagnosis/ Prognosis;Newly Diagnosed Cancer Education (06/30/19 1600) Interventions: Coordination of Care;Referrals;Education (06/30/19 1600) Referrals: Radiation (06/30/19 1600) Coordination of Care: Radiology;Appts;Other (06/30/19 1600) Education Method: Verbal (06/30/19 1600)      Acuity: Level 2 (06/30/19 1600)         Time Spent with Patient: 30 (06/30/19 1600)    Met with patient during the visit with Dr. Delton Coombes. She was here today alone. She states that she fears having surgery or biopsy of the lung nodule due to her already poor lung status.  She is willing to see radiation for consult and possible treatment but wishes to definitely not have surgery.  We have referred her to radiation and will see her back after scans.  She was given my contact information and I explained how I will be involved in her care.  She verbalizes understanding and appreciation.

## 2019-06-30 NOTE — Progress Notes (Signed)
[facility] CONSULT NOTE  Patient Care Team: Kela Millin, MD as PCP - General (Family Medicine) Marinus Maw, MD as PCP - Electrophysiology (Cardiology)  CHIEF COMPLAINTS/PURPOSE OF CONSULTATION:  Right lung nodule.  HISTORY OF PRESENTING ILLNESS:  Rhonda Spears 85 y.o. female is seen in consultation today for further work-up and management of right lung nodule.  She had developed chronic cough after a bout of laryngitis around Thanksgiving of 2019.  She has been seeing Dr. Suszanne Conners for this cough.  He had ordered a CT of the neck and chest which was done in [facility].  This revealed right lower lobe lung nodule on 04/14/2019.  A PET CT scan on 04/21/2019 showed 1.5 x 1.2 cm right lower lobe lung nodule SUV 8.9.  Flexible laryngoscopy on 06/16/2019 showed normal morphology of head and neck region.  She has dry cough which is stable.  Denies any infections in the last 6 months, particularly pneumonia.  No headaches or vision changes.  Denies any fevers or night sweats.  She was trying to lose weight and lost 10 pounds but gained them back.  Denies any recent hospitalizations.  Patient was a never smoker.  She had secondhand cigarette smoke exposure.  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  Patient lives by herself at home and is independent of ADLs and IADLs.  No family history of malignancy reported.  Appetite is 75%.  Energy levels are 50%.  Low back pain was rated as 4 out of 10.  MEDICAL HISTORY:  Past Medical History:  Diagnosis Date  . Chest pain with moderate risk for cardiac etiology 03/30/2017  . CKD (chronic kidney disease), stage III (HCC)   . COPD (chronic obstructive pulmonary disease) (HCC)   . Gout   . Hypertension   . PVC's (premature ventricular contractions)   . Renal disorder   . Sinus bradycardia   . Tricuspid regurgitation   . Venous insufficiency     SURGICAL HISTORY: Past Surgical History:  Procedure  Laterality Date  . ABDOMINAL HYSTERECTOMY    . arthroscopic left knee    . INTRAMEDULLARY (IM) NAIL INTERTROCHANTERIC Right 06/12/2018   Procedure: INTRAMEDULLARY (IM) NAIL INTERTROCHANTRIC;  Surgeon: Bjorn Pippin, MD;  Location: [facility] OR;  Service: Orthopedics;  Laterality: Right;  . repair left femoral neck    . right medial men      SOCIAL HISTORY: Social History   Socioeconomic History  . Marital status: Divorced    Spouse name: Not on file  . Number of children: 10  . Years of education: Not on file  . Highest education level: Not on file  Occupational History  . Occupation: Retired  Engineer, production  . Financial resource strain: Not very hard  . Food insecurity    Worry: Never true    Inability: Never true  . Transportation needs    Medical: No    Non-medical: No  Tobacco Use  . Smoking status: Never Smoker  . Smokeless tobacco: Never Used  Substance and Sexual Activity  . Alcohol use: No  . Drug use: No  . Sexual activity: Not on file  Lifestyle  . Physical activity    Days per week: 0 days    Minutes per session: 0 min  . Stress: Not at all  Relationships  . Social Musician on phone: Three times a week    Gets together: Three times a  week    Attends religious service: More than 4 times per year    Active member of club or organization: Yes    Attends meetings of clubs or organizations: More than 4 times per year    Relationship status: Divorced  . Intimate partner violence    Fear of current or ex partner: No    Emotionally abused: No    Physically abused: No    Forced sexual activity: No  Other Topics Concern  . Not on file  Social History Narrative   [place]    FAMILY HISTORY: Family History  Problem Relation Age of Onset  . Heart attack Mother   . Hypertension Mother   . Early death Father [id number]       Car accident    ALLERGIES:  is allergic to latex; omeprazole; tramadol; lasix [furosemide]; sulfa antibiotics; and  zanaflex [tizanidine hcl].  MEDICATIONS:  Current Outpatient Medications  Medication Sig Dispense Refill  . allopurinol (ZYLOPRIM) 100 MG tablet Take 200 mg by mouth at bedtime.     Marland Kitchen. amiodarone (PACERONE) 200 MG tablet Take 0.5 tablets (100 mg total) by mouth daily. 45 tablet 3  . aspirin EC 81 MG tablet Take 81 mg by mouth daily.    . Calcium Carb-Cholecalciferol (CALCIUM-VITAMIN D) 500-200 MG-UNIT tablet Take 1 tablet by mouth 3 (three) times daily. 90 tablet 1  . cholecalciferol (VITAMIN D) 1000 units tablet Take 1,000 Units by mouth every morning.     . diclofenac sodium (VOLTAREN) 1 % GEL APPLY 2 GRAMS TOPICALLY EVERY 12 HOURS AS NEEDED FOR PAIN    . folic acid (FOLVITE) 400 MCG tablet Take 400 mcg by mouth daily.    . furosemide (LASIX) 20 MG tablet Take 1 tablet (20 mg total) by mouth daily. 90 tablet 3  . Magnesium 250 MG TABS Take 250 mg by mouth daily.    . Multiple Vitamin (MULTIVITAMIN) capsule Take 1 capsule by mouth daily.    Marland Kitchen. oxyCODONE (OXY IR/ROXICODONE) 5 MG immediate release tablet Take 1 tablet (5 mg total) by mouth every 4 (four) hours as needed for moderate pain or severe pain. (Patient not taking: Reported on 06/27/2019) 14 tablet 0  . potassium chloride (KLOR-CON) 20 MEQ packet Take 20 mEq by mouth as directed. Takes twice a week    . senna-docusate (SENOKOT-S) 8.6-50 MG tablet Take 2 tablets by mouth 2 (two) times daily. (Patient not taking: Reported on 06/27/2019) 120 tablet 2  . TURMERIC PO daily.      No current facility-administered medications for this visit.     REVIEW OF SYSTEMS:   Constitutional: Denies fevers, chills or abnormal night sweats Eyes: Denies blurriness of vision, double vision or watery eyes Ears, nose, mouth, throat, and face: Denies mucositis or sore throat Respiratory: Chronic dry cough present.  Shortness of breath on exertion present. Cardiovascular: Denies palpitation, chest discomfort or lower extremity swelling Gastrointestinal:   Denies nausea, heartburn or change in bowel habits Skin: Denies abnormal skin rashes Lymphatics: Denies new lymphadenopathy or easy bruising Neurological:Denies numbness, tingling or new weaknesses Behavioral/Psych: Mood is stable, no new changes  All other systems were reviewed with the patient and are negative.  PHYSICAL EXAMINATION: ECOG PERFORMANCE STATUS: 1 - Symptomatic but completely ambulatory  Vitals:   06/30/19 1441  BP: (!) 165/82  Pulse: 65  Resp: (!) 22  Temp: (!) 97.1 F (36.2 C)  SpO2: 98%   Filed Weights   06/30/19 1441  Weight:  140 lb (63.5 kg)    GENERAL:alert, no distress and comfortable SKIN: skin color, texture, turgor are normal, no rashes or significant lesions EYES: normal, conjunctiva are pink and non-injected, sclera clear OROPHARYNX:no exudate, no erythema and lips, buccal mucosa, and tongue normal  NECK: supple, thyroid normal size, non-tender, without nodularity LYMPH:  no palpable lymphadenopathy in the cervical, axillary or inguinal LUNGS: clear to auscultation and percussion with normal breathing effort HEART: regular rate & rhythm and no murmurs and no lower extremity edema ABDOMEN:abdomen soft, non-tender and normal bowel sounds Musculoskeletal:no cyanosis of digits and no clubbing  PSYCH: alert & oriented x 3 with fluent speech NEURO: no focal motor/sensory deficits  LABORATORY DATA:  I have reviewed the data as listed Lab Results  Component Value Date   WBC 13.6 (H) 06/14/2018   HGB 10.1 (L) 06/14/2018   HCT 31.0 (L) 06/14/2018   MCV 101.6 (H) 06/14/2018   PLT 138 (L) 06/14/2018     Chemistry      Component Value Date/Time   NA 136 06/14/2018 0309   K 4.0 06/14/2018 0309   CL 103 06/14/2018 0309   CO2 27 06/14/2018 0309   BUN 17 06/14/2018 0309   CREATININE 1.05 (H) 06/14/2018 0309      Component Value Date/Time   CALCIUM 8.1 (L) 06/14/2018 0309   ALKPHOS 65 06/12/2018 0220   AST 26 06/12/2018 0220   ALT 17 06/12/2018  0220   BILITOT 1.0 06/12/2018 0220       RADIOGRAPHIC STUDIES: I have personally reviewed the radiological images as listed and agreed with the findings in the report.  ASSESSMENT & PLAN:  Nodule of right lung 1.  Right lower lobe lung nodule: - Patient seen by Dr. Suszanne Connerseoh for evaluation of chronic cough since November 2019. -[facility] CT of the neck [facility].  Patient is a never smoker. -PET CT scan on 04/21/2019 showed 1.5 x 1.2 cm (SUV 8.9) Right lower lobe lung nodule in the superior segment.  There is some indistinctly marginated left hilar activity with maximum SUV of 3.6. - Flexible laryngoscopy on 06/16/2019 showed no lesions in the nasopharynx, oropharynx, hypopharynx, larynx.  Base of tongue was within normal limits. - Patient was reluctant to consider any form of therapy after the PET CT scan.  Today she wants to know her options. - I have recommended doing brain scan to complete work-up.  We will also refer her for CT-guided biopsy of the lung nodule. - We talked about surgical resection option.  She does not want to have surgery done as she had problems with anesthesia the last 2 times.  The next best option would be radiation therapy, likely SBRT. - We will also order PFTs.  I will see her back after the biopsy to discuss various options.  Orders Placed This Encounter  Procedures  . MR Brain W Wo Contrast    Standing Status:   Future    Standing Expiration Date:   06/29/2020    Order Specific Question:   ** REASON FOR EXAM (FREE TEXT)    Answer:   right lower lobe lung nodule    Order Specific Question:   If indicated for the ordered procedure, I authorize the administration of contrast media per Radiology protocol    Answer:   Yes    Order Specific Question:   What is the patient's sedation requirement?    Answer:   No Sedation  Order Specific Question:   Does the patient have a pacemaker or implanted devices?    Answer:   No     Order Specific Question:   Use SRS Protocol?    Answer:   No    Order Specific Question:   Radiology Contrast Protocol - do NOT remove file path    Answer:   \\[email or web address]    Order Specific Question:   Preferred imaging location?    Answer:   [facility] (table limit-350lbs)  . CT Biopsy    Standing Status:   Future    Standing Expiration Date:   06/29/2020    Order Specific Question:   Lab orders requested (DO NOT place separate lab orders, these will be automatically ordered during procedure specimen collection):    Answer:   Surgical Pathology    Order Specific Question:   Reason for Exam (SYMPTOM  OR DIAGNOSIS REQUIRED)    Answer:   right lung nodule    Order Specific Question:   Preferred location?    Answer:   [facility]    Order Specific Question:   Radiology Contrast Protocol - do NOT remove file path    Answer:   \\[email or web address]  . Pulmonary Function Test    Standing Status:   Future    Standing Expiration Date:   06/29/2020    Order Specific Question:   Where should this test be performed?    Answer:   [facility]    Order Specific Question:   Full PFT: includes the following: basic spirometry, spirometry pre & post bronchodilator, diffusion capacity (DLCO), lung volumes    Answer:   Full PFT    All questions were answered. The patient knows to call the clinic with any problems, questions or concerns.      Derek Jack, MD 06/30/2019 4:29 PM

## 2019-07-01 ENCOUNTER — Encounter (HOSPITAL_COMMUNITY): Payer: Self-pay | Admitting: Lab

## 2019-07-01 NOTE — Progress Notes (Signed)
Faxed referral and patient records to [facility] radiation oncology.  [phone]

## 2019-07-04 ENCOUNTER — Other Ambulatory Visit: Payer: Self-pay | Admitting: Radiology

## 2019-07-04 ENCOUNTER — Ambulatory Visit (HOSPITAL_COMMUNITY)
Admission: RE | Admit: 2019-07-04 | Discharge: 2019-07-04 | Disposition: A | Discharge status: Home / Self Care | Payer: Medicare Other | Source: Ambulatory Visit | Attending: Hematology | Admitting: Hematology

## 2019-07-04 ENCOUNTER — Other Ambulatory Visit: Payer: Self-pay

## 2019-07-04 DIAGNOSIS — R911 Solitary pulmonary nodule: Secondary | ICD-10-CM | POA: Diagnosis present

## 2019-07-04 LAB — POCT I-STAT CREATININE: Creatinine, Ser: 1.4 mg/dL — ABNORMAL HIGH (ref 0.44–1.00)

## 2019-07-05 ENCOUNTER — Ambulatory Visit (HOSPITAL_COMMUNITY)
Admission: RE | Admit: 2019-07-05 | Discharge: 2019-07-05 | Disposition: A | Discharge status: Home / Self Care | Payer: Medicare Other | Source: Ambulatory Visit | Attending: Family Medicine | Admitting: Family Medicine

## 2019-07-05 NOTE — Progress Notes (Signed)
Called Rhonda Spears in regards to Covid testing for today.  She states she can't come today.  Told her if she could come Tuesday at 8am we could do a rapid test for her CT scan.  She states she is thinking about canceling her procedure.  I instructed her to contact her MD to let them know what her decision is.

## 2019-07-08 ENCOUNTER — Ambulatory Visit (HOSPITAL_COMMUNITY)
Admission: RE | Admit: 2019-07-08 | Discharge: 2019-07-08 | Disposition: A | Discharge status: Home / Self Care | Payer: Medicare Other | Source: Ambulatory Visit | Attending: Hematology | Admitting: Hematology

## 2019-07-08 ENCOUNTER — Encounter (HOSPITAL_COMMUNITY): Payer: Self-pay

## 2019-07-09 ENCOUNTER — Ambulatory Visit (HOSPITAL_COMMUNITY): Admission: RE | Admit: 2019-07-09 | Payer: Medicare Other | Source: Ambulatory Visit

## 2019-07-09 ENCOUNTER — Telehealth (HOSPITAL_COMMUNITY): Payer: Self-pay | Admitting: *Deleted

## 2019-07-09 NOTE — Telephone Encounter (Signed)
I had a lengthy conversation with the patient today. She states that she has decided she does not want any scans or biopsies of the areas in concern. She fears that she will find out something that will keep her from living.  She states that after the MRI machine went down she saw that as a sign to not go through with the biopsy.  I advised her to still come and have a conversation with the provider to share her fears and concerns and she refused.  She wants to visit family that she hasn't seen in a while and spend time with friends that she has left.  She stated that she just wants to cancel everything for now and if she decides differently she will call us to reschedule a visit with Dr. Delton Coombes. I have cancelled her testing, follow ups with [place] and I have notified Dr. Sondra Come of the above.

## 2019-07-14 ENCOUNTER — Ambulatory Visit (HOSPITAL_COMMUNITY): Payer: Medicare Other | Admitting: Hematology

## 2019-07-16 ENCOUNTER — Ambulatory Visit: Payer: Medicare Other

## 2019-07-16 ENCOUNTER — Institutional Professional Consult (permissible substitution): Payer: Medicare Other | Admitting: Radiation Oncology

## 2019-08-18 ENCOUNTER — Other Ambulatory Visit: Payer: Self-pay

## 2019-08-18 ENCOUNTER — Ambulatory Visit (INDEPENDENT_AMBULATORY_CARE_PROVIDER_SITE_OTHER): Payer: Medicare Other | Admitting: Otolaryngology

## 2019-08-18 DIAGNOSIS — H6983 Other specified disorders of Eustachian tube, bilateral: Secondary | ICD-10-CM

## 2019-08-18 DIAGNOSIS — R05 Cough: Secondary | ICD-10-CM

## 2019-08-18 DIAGNOSIS — J343 Hypertrophy of nasal turbinates: Secondary | ICD-10-CM | POA: Diagnosis not present

## 2019-08-18 DIAGNOSIS — J310 Chronic rhinitis: Secondary | ICD-10-CM

## 2019-08-18 DIAGNOSIS — H9209 Otalgia, unspecified ear: Secondary | ICD-10-CM

## 2019-09-18 ENCOUNTER — Other Ambulatory Visit: Payer: Self-pay | Admitting: Orthopaedic Surgery

## 2019-09-18 DIAGNOSIS — M79604 Pain in right leg: Secondary | ICD-10-CM

## 2019-11-26 ENCOUNTER — Encounter: Payer: Self-pay | Admitting: Internal Medicine

## 2019-11-26 ENCOUNTER — Other Ambulatory Visit: Payer: Self-pay

## 2019-11-26 ENCOUNTER — Ambulatory Visit (INDEPENDENT_AMBULATORY_CARE_PROVIDER_SITE_OTHER): Payer: Medicare Other | Admitting: Internal Medicine

## 2019-11-26 VITALS — BP 153/85 | HR 64 | Ht 61.5 in | Wt 140.0 lb

## 2019-11-26 DIAGNOSIS — I495 Sick sinus syndrome: Secondary | ICD-10-CM

## 2019-11-26 DIAGNOSIS — I493 Ventricular premature depolarization: Secondary | ICD-10-CM

## 2019-11-26 DIAGNOSIS — I10 Essential (primary) hypertension: Secondary | ICD-10-CM | POA: Diagnosis not present

## 2019-11-26 MED ORDER — AMIODARONE HCL 200 MG PO TABS: 100.0000 mg | ORAL_TABLET | Freq: Every day | ORAL | 3 refills | Status: DC

## 2019-11-26 NOTE — Patient Instructions (Signed)
Medication Instructions:  Your physician recommends that you continue on your current medications as directed. Please refer to the Current Medication list given to you today.  Hold Amiodarone for 3 months  *If you need a refill on your cardiac medications before your next appointment, please call your pharmacy*  Lab Work: NONE   If you have labs (blood work) drawn today and your tests are completely normal, you will receive your results only by: Marland Kitchen MyChart Message (if you have MyChart) OR . A paper copy in the mail If you have any lab test that is abnormal or we need to change your treatment, we will call you to review the results.  Testing/Procedures: NONE   Follow-Up: At [facility], you and your health needs are our priority.  As part of our continuing mission to provide you with exceptional heart care, we have created designated Provider Care Teams.  These Care Teams include your primary Cardiologist (physician) and Advanced Practice Providers (APPs -  Physician Assistants and Nurse Practitioners) who all work together to provide you with the care you need, when you need it.  Your next appointment:   1 year(s)  The format for your next appointment:   In Person  Provider:   Lewayne Bunting, MD  Other Instructions Thank you for choosing [facility]!

## 2019-11-26 NOTE — Progress Notes (Signed)
HPI [name] returns today for followup. She is a pleasant 86 yo woman with a h/o PVC's, COPD, and diastolic heart failure. She was placed on amiodarone and her PVC's improved. She c/o sob today with no chest pain. She denies palpitations. Minimal edema. She has been diagnosed with probable lung CA. She has opted not to undergo evaluation and treatment. She notes some difficulty with balance in the past 3 months. No obvious etiology.   Allergies  Allergen Reactions  . Latex Itching  . Omeprazole Palpitations  . Tramadol Itching and Nausea Only  . Sulfa Antibiotics Nausea Only and Other (See Comments)    Severe headaches  . Zanaflex [Tizanidine Hcl] Itching and Other (See Comments)    oversedation     Current Outpatient Medications  Medication Sig Dispense Refill  . allopurinol (ZYLOPRIM) 100 MG tablet Take 100 mg by mouth at bedtime.     Marland Kitchen amiodarone (PACERONE) 200 MG tablet Take 0.5 tablets (100 mg total) by mouth daily. 45 tablet 3  . aspirin EC 81 MG tablet Take 81 mg by mouth daily.    . Calcium Carb-Cholecalciferol (CALCIUM 600 + D PO) Take 1 tablet by mouth daily.    . Carboxymethylcellulose Sod PF (THERATEARS PF) 0.25 % SOLN Place 1 drop into both eyes 2 (two) times daily as needed (dry eyes).    . cholecalciferol (VITAMIN D) 1000 units tablet Take 1,000 Units by mouth every morning.     . diclofenac sodium (VOLTAREN) 1 % GEL Apply 2 g topically 2 (two) times daily as needed (pain).     . folic acid (FOLVITE) 400 MCG tablet Take 400 mcg by mouth daily.    . furosemide (LASIX) 20 MG tablet Take 1 tablet (20 mg total) by mouth daily. 90 tablet 3  . HYDROcodone-acetaminophen (NORCO/VICODIN) 5-325 MG tablet Take 1 tablet by mouth every 6 (six) hours as needed for moderate pain.    . Magnesium 250 MG TABS Take 250 mg by mouth daily.    . Multiple Vitamin (MULTIVITAMIN) capsule Take 1 capsule by mouth daily.    . potassium chloride (KLOR-CON) 20 MEQ packet Take 20  mEq by mouth once a week.      No current facility-administered medications for this visit.     Past Medical History:  Diagnosis Date  . Chest pain with moderate risk for cardiac etiology 03/30/2017  . CKD (chronic kidney disease), stage III (HCC)   . COPD (chronic obstructive pulmonary disease) (HCC)   . Gout   . Hypertension   . PVC's (premature ventricular contractions)   . Renal disorder   . Sinus bradycardia   . Tricuspid regurgitation   . Venous insufficiency     ROS:   All systems reviewed and negative except as noted in the HPI.   Past Surgical History:  Procedure Laterality Date  . ABDOMINAL HYSTERECTOMY    . arthroscopic left knee    . INTRAMEDULLARY (IM) NAIL INTERTROCHANTERIC Right 06/12/2018   Procedure: INTRAMEDULLARY (IM) NAIL INTERTROCHANTRIC;  Surgeon: Bjorn Pippin, MD;  Location: [facility];  Service: Orthopedics;  Laterality: Right;  . repair left femoral neck    . right medial men       Family History  Problem Relation Age of Onset  . Heart attack Mother   . Hypertension Mother   . Early death Father 21       Car accident     Social History  Socioeconomic History  . Marital status: Divorced    Spouse name: Not on file  . Number of children: 10  . Years of education: Not on file  . Highest education level: Not on file  Occupational History  . Occupation: Retired  Tobacco Use  . Smoking status: Never Smoker  . Smokeless tobacco: Never Used  Substance and Sexual Activity  . Alcohol use: No  . Drug use: No  . Sexual activity: Not on file  Other Topics Concern  . Not on file  Social History Narrative   Pt lives alone in [place], [place]   Social Determinants of Health   Financial Resource Strain: Low Risk   . Difficulty of Paying Living Expenses: Not very hard  Food Insecurity: No Food Insecurity  . Worried About Charity fundraiser in the Last Year: Never true  . Ran Out of Food in the Last Year: Never true  Transportation Needs: No  Transportation Needs  . Lack of Transportation (Medical): No  . Lack of Transportation (Non-Medical): No  Physical Activity: Inactive  . Days of Exercise per Week: 0 days  . Minutes of Exercise per Session: 0 min  Stress: No Stress Concern Present  . Feeling of Stress : Not at all  Social Connections: Slightly Isolated  . Frequency of Communication with Friends and Family: Three times a week  . Frequency of Social Gatherings with Friends and Family: Three times a week  . Attends Religious Services: More than 4 times per year  . Active Member of Clubs or Organizations: Yes  . Attends Archivist Meetings: More than 4 times per year  . Marital Status: Divorced  Human resources officer Violence: Not At Risk  . Fear of Current or Ex-Partner: No  . Emotionally Abused: No  . Physically Abused: No  . Sexually Abused: No     Ht 5' 1.5" (1.562 m)   Wt 140 lb (63.5 kg)   BMI 26.02 kg/m   Physical Exam:  Well appearing NAD HEENT: Unremarkable Neck:  No JVD, no thyromegally Lymphatics:  No adenopathy Back:  No CVA tenderness Lungs:  Clear with no wheezes HEART:  Regular rate rhythm, no murmurs, no rubs, no clicks Abd:  soft, positive bowel sounds, no organomegally, no rebound, no guarding Ext:  2 plus pulses, no edema, no cyanosis, no clubbing Skin:  No rashes no nodules Neuro:  CN II through XII intact, motor grossly intact  DEVICE  Normal device function.  See PaceArt for details.   Assess/Plan: 1. Symptomatic PVC's - She has few symptoms. I have recommended she stop amiodarone due to problems below. 2. Poor balance - she has fallen several times in the past few months. I have asked her to stop amiodarone for 3 months. If the balance improves, then she will stay off of the amio. If the balance does not change, then she will restart the amio. 3. Diastolic heart failure - her symptoms are class 2. She will continue her current meds. 4. Lung Ca - she has no symptoms and has  elected not to undergo eval and treatment.    ,M.D. 2. Diverticulitis - his symptoms currently appear to be controlled. He will continue his current meds. Hopefully his symptoms will remain quite 3. ICD - his

## 2020-02-28 ENCOUNTER — Encounter (HOSPITAL_COMMUNITY): Payer: Self-pay | Admitting: *Deleted

## 2020-02-28 ENCOUNTER — Emergency Department (HOSPITAL_COMMUNITY): Payer: Medicare Other

## 2020-02-28 ENCOUNTER — Other Ambulatory Visit: Payer: Self-pay

## 2020-02-28 ENCOUNTER — Observation Stay (HOSPITAL_COMMUNITY)
Admission: EM | Admit: 2020-02-28 | Discharge: 2020-03-01 | Disposition: A | Discharge status: Home / Self Care | Payer: Medicare Other | Attending: Internal Medicine | Admitting: Internal Medicine

## 2020-02-28 DIAGNOSIS — I2510 Atherosclerotic heart disease of native coronary artery without angina pectoris: Secondary | ICD-10-CM | POA: Diagnosis not present

## 2020-02-28 DIAGNOSIS — N1831 Chronic kidney disease, stage 3a: Secondary | ICD-10-CM | POA: Insufficient documentation

## 2020-02-28 DIAGNOSIS — I081 Rheumatic disorders of both mitral and tricuspid valves: Secondary | ICD-10-CM | POA: Diagnosis not present

## 2020-02-28 DIAGNOSIS — Z885 Allergy status to narcotic agent status: Secondary | ICD-10-CM | POA: Insufficient documentation

## 2020-02-28 DIAGNOSIS — N183 Chronic kidney disease, stage 3 unspecified: Secondary | ICD-10-CM | POA: Diagnosis present

## 2020-02-28 DIAGNOSIS — I440 Atrioventricular block, first degree: Secondary | ICD-10-CM | POA: Insufficient documentation

## 2020-02-28 DIAGNOSIS — I872 Venous insufficiency (chronic) (peripheral): Secondary | ICD-10-CM | POA: Diagnosis not present

## 2020-02-28 DIAGNOSIS — Z79899 Other long term (current) drug therapy: Secondary | ICD-10-CM | POA: Insufficient documentation

## 2020-02-28 DIAGNOSIS — Z7982 Long term (current) use of aspirin: Secondary | ICD-10-CM | POA: Diagnosis not present

## 2020-02-28 DIAGNOSIS — M109 Gout, unspecified: Secondary | ICD-10-CM | POA: Insufficient documentation

## 2020-02-28 DIAGNOSIS — Z20822 Contact with and (suspected) exposure to covid-19: Secondary | ICD-10-CM | POA: Diagnosis not present

## 2020-02-28 DIAGNOSIS — I950 Idiopathic hypotension: Secondary | ICD-10-CM | POA: Insufficient documentation

## 2020-02-28 DIAGNOSIS — R072 Precordial pain: Secondary | ICD-10-CM

## 2020-02-28 DIAGNOSIS — I700 Atherosclerosis of aorta: Secondary | ICD-10-CM | POA: Diagnosis not present

## 2020-02-28 DIAGNOSIS — Z888 Allergy status to other drugs, medicaments and biological substances status: Secondary | ICD-10-CM | POA: Diagnosis not present

## 2020-02-28 DIAGNOSIS — J479 Bronchiectasis, uncomplicated: Secondary | ICD-10-CM

## 2020-02-28 DIAGNOSIS — I130 Hypertensive heart and chronic kidney disease with heart failure and stage 1 through stage 4 chronic kidney disease, or unspecified chronic kidney disease: Secondary | ICD-10-CM | POA: Diagnosis not present

## 2020-02-28 DIAGNOSIS — I5032 Chronic diastolic (congestive) heart failure: Secondary | ICD-10-CM | POA: Insufficient documentation

## 2020-02-28 DIAGNOSIS — K449 Diaphragmatic hernia without obstruction or gangrene: Secondary | ICD-10-CM | POA: Insufficient documentation

## 2020-02-28 DIAGNOSIS — J449 Chronic obstructive pulmonary disease, unspecified: Secondary | ICD-10-CM | POA: Insufficient documentation

## 2020-02-28 DIAGNOSIS — Z882 Allergy status to sulfonamides status: Secondary | ICD-10-CM | POA: Diagnosis not present

## 2020-02-28 DIAGNOSIS — I10 Essential (primary) hypertension: Secondary | ICD-10-CM | POA: Diagnosis present

## 2020-02-28 DIAGNOSIS — R079 Chest pain, unspecified: Principal | ICD-10-CM | POA: Insufficient documentation

## 2020-02-28 LAB — RESPIRATORY PANEL BY RT PCR (FLU A&B, COVID)
Influenza A by PCR: NEGATIVE
Influenza B by PCR: NEGATIVE
SARS Coronavirus 2 by RT PCR: NEGATIVE

## 2020-02-28 LAB — TROPONIN I (HIGH SENSITIVITY)
Troponin I (High Sensitivity): 4 ng/L (ref <18)
Troponin I (High Sensitivity): 4 ng/L (ref <18)
Troponin I (High Sensitivity): 5 ng/L (ref <18)

## 2020-02-28 LAB — CBC WITH DIFFERENTIAL/PLATELET
Abs Immature Granulocytes: 0.05 10*3/uL (ref 0.00–0.07)
Basophils Absolute: 0.1 10*3/uL (ref 0.0–0.1)
Basophils Relative: 1 %
Eosinophils Absolute: 0.3 10*3/uL (ref 0.0–0.5)
Eosinophils Relative: 3 %
HCT: 40.7 % (ref 36.0–46.0)
Hemoglobin: 13.2 g/dL (ref 12.0–15.0)
Immature Granulocytes: 1 %
Lymphocytes Relative: 39 %
Lymphs Abs: 3.4 10*3/uL (ref 0.7–4.0)
MCH: 32.9 pg (ref 26.0–34.0)
MCHC: 32.4 g/dL (ref 30.0–36.0)
MCV: 101.5 fL — ABNORMAL HIGH (ref 80.0–100.0)
Monocytes Absolute: 0.7 10*3/uL (ref 0.1–1.0)
Monocytes Relative: 8 %
Neutro Abs: 4.3 10*3/uL (ref 1.7–7.7)
Neutrophils Relative %: 48 %
Platelets: 218 10*3/uL (ref 150–400)
RBC: 4.01 MIL/uL (ref 3.87–5.11)
RDW: 13.9 % (ref 11.5–15.5)
WBC: 8.7 10*3/uL (ref 4.0–10.5)
nRBC: 0 % (ref 0.0–0.2)

## 2020-02-28 LAB — COMPREHENSIVE METABOLIC PANEL
ALT: 16 U/L (ref 0–44)
AST: 25 U/L (ref 15–41)
Albumin: 3.8 g/dL (ref 3.5–5.0)
Alkaline Phosphatase: 60 U/L (ref 38–126)
Anion gap: 10 (ref 5–15)
BUN: 20 mg/dL (ref 8–23)
CO2: 26 mmol/L (ref 22–32)
Calcium: 9.4 mg/dL (ref 8.9–10.3)
Chloride: 101 mmol/L (ref 98–111)
Creatinine, Ser: 1.16 mg/dL — ABNORMAL HIGH (ref 0.44–1.00)
GFR calc Af Amer: 50 mL/min — ABNORMAL LOW (ref >60)
GFR calc non Af Amer: 44 mL/min — ABNORMAL LOW (ref >60)
Glucose, Bld: 85 mg/dL (ref 70–99)
Potassium: 3.8 mmol/L (ref 3.5–5.1)
Sodium: 137 mmol/L (ref 135–145)
Total Bilirubin: 0.7 mg/dL (ref 0.3–1.2)
Total Protein: 7.9 g/dL (ref 6.5–8.1)

## 2020-02-28 MED ORDER — NITROGLYCERIN 0.4 MG SL SUBL
0.4000 mg | SUBLINGUAL_TABLET | Freq: Once | SUBLINGUAL | Status: AC
  Administered 2020-02-28: 16:00:00 0.4 mg via SUBLINGUAL
  Filled 2020-02-28: qty 1

## 2020-02-28 MED ORDER — ASPIRIN 81 MG PO CHEW
324.0000 mg | CHEWABLE_TABLET | Freq: Once | ORAL | Status: AC
  Administered 2020-02-28: 324 mg via ORAL
  Filled 2020-02-28: qty 4

## 2020-02-28 NOTE — H&P (Addendum)
History and Physical    TKEYAH BURKMAN Spears:[id number] DOB: 1934/05/14 DOA: 02/28/2020  PCP: Leeanne Rio, MD   Patient coming from: Home  I have personally briefly reviewed patient's old medical records in [place]  Chief Complaint: Chest Pain  HPI: Rhonda Spears is a 86 y.o. female with medical history significant for hypertension, CKD 3, COPD, tricuspid regurgitation.  Patient presented to the ED with complaints of chest pain that started at about 9 AM this morning when she woke up, she reports chest pressure mid to left side of her chest, with tightness radiating to her left arm and jaw. She reports 2 hours later at about 11 AM pain became sharp. Pain is not related to activity. No prior episodes of similar chest pain. Chest pain lasts few seconds and resolves. Reports several episodes. She was given aspirin and nitro in the ED, and chest pain severity has improved. She has chronic unchanged difficulty breathing, and cough.  ED Course: Blood pressure elevated systolic 867E.  Troponin unremarkable at 4.  EKG with sinus rhythm without significant ST or T wave abnormalities.  Portable chest x-ray shows biapical scarring, increased density right lung apex, nonemergent follow-up CT chest recommended.  With concern for ACS, recurrence of intermittent pain, hospitalist to admit for chest pain.  Review of Systems: As per HPI all other systems reviewed and negative.  Past Medical History:  Diagnosis Date  . Chest pain with moderate risk for cardiac etiology 03/30/2017  . CKD (chronic kidney disease), stage III   . COPD (chronic obstructive pulmonary disease) ([place])   . Gout   . Hypertension   . PVC's (premature ventricular contractions)   . Renal disorder   . Sinus bradycardia   . Tricuspid regurgitation   . Venous insufficiency     Past Surgical History:  Procedure Laterality Date  . ABDOMINAL HYSTERECTOMY    . arthroscopic left knee    . INTRAMEDULLARY (IM) NAIL  INTERTROCHANTERIC Right 06/12/2018   Procedure: INTRAMEDULLARY (IM) NAIL INTERTROCHANTRIC;  Surgeon: Hiram Gash, MD;  Location: [place];  Service: Orthopedics;  Laterality: Right;  . repair left femoral neck    . right medial men       reports that she has never smoked. She has never used smokeless tobacco. She reports that she does not drink alcohol or use drugs.  Allergies  Allergen Reactions  . Latex Itching  . Omeprazole Palpitations  . Tramadol Itching and Nausea Only  . Sulfa Antibiotics Nausea Only and Other (See Comments)    Severe headaches  . Zanaflex [Tizanidine Hcl] Itching and Other (See Comments)    oversedation    Family History  Problem Relation Age of Onset  . Heart attack Mother   . Hypertension Mother   . Early death Father 65       Car accident    Prior to Admission medications   Medication Sig Start Date End Date Taking? Authorizing Provider  allopurinol (ZYLOPRIM) 100 MG tablet Take 100 mg by mouth at bedtime.    Yes [provider]  aspirin EC 81 MG tablet Take 81 mg by mouth every Monday, Wednesday, and Friday.    Yes [provider]  Calcium Carb-Cholecalciferol (CALCIUM 600 + D PO) Take 1 tablet by mouth daily.   Yes [provider]  Carboxymethylcellulose Sod PF (THERATEARS PF) 0.25 % SOLN Place 1 drop into both eyes 2 (two) times daily as needed (dry eyes).   Yes [provider]  cholecalciferol (VITAMIN D) 1000 units tablet Take 1,000 Units by mouth every morning.    Yes [provider]  folic acid (FOLVITE) 400 MCG tablet Take 400 mcg by mouth daily.   Yes [provider]  furosemide (LASIX) 20 MG tablet Take 1 tablet (20 mg total) by mouth daily. 05/21/19  Yes Marinus Maw, MD  HYDROcodone-acetaminophen (NORCO/VICODIN) 5-325 MG tablet Take 1 tablet by mouth every 6 (six) hours as needed for moderate pain.   Yes [provider]  Magnesium 250 MG TABS Take 250 mg by mouth daily.   Yes  [provider]  Multiple Vitamin (MULTIVITAMIN) capsule Take 1 capsule by mouth daily.   Yes [provider]  potassium chloride (KLOR-CON) 20 MEQ packet Take 20 mEq by mouth once a week.    Yes [provider]  amiodarone (PACERONE) 200 MG tablet Take 0.5 tablets (100 mg total) by mouth daily. HOLD for 3 Months 11/26/19 ( for balance) 11/26/19   Marinus Maw, MD  diclofenac sodium (VOLTAREN) 1 % GEL Apply 2 g topically 2 (two) times daily as needed (pain).  07/24/18   [provider]    Physical Exam: Vitals:   02/28/20 1441 02/28/20 1500  BP: (!) 189/98 (!) 180/94  Pulse: 72 65  Resp: 20 14  Temp: 98 F (36.7 C)   SpO2: 98% 97%    Constitutional: NAD, calm, comfortable Vitals:   02/28/20 1441 02/28/20 1500  BP: (!) 189/98 (!) 180/94  Pulse: 72 65  Resp: 20 14  Temp: 98 F (36.7 C)   SpO2: 98% 97%   Eyes: PERRL, lids and conjunctivae normal ENMT: Mucous membranes are moist. Posterior pharynx clear of any exudate or lesions.  Neck: normal, supple, no masses, no thyromegaly Respiratory: clear to auscultation bilaterally, no wheezing, no crackles. Normal respiratory effort. No accessory muscle use.  Cardiovascular: Regular rate and rhythm, no murmurs / rubs / gallops. No extremity edema. 2+ pedal pulses.  Abdomen: no tenderness, no masses palpated. No hepatosplenomegaly. Bowel sounds positive.  Musculoskeletal: no clubbing / cyanosis. No joint deformity upper and lower extremities. Good ROM, no contractures.  Skin: no rashes, lesions, ulcers. No induration Neurologic: CN 2-12 grossly intact. Sensation intact, DTR normal. Strength 5/5 in all 4.  Psychiatric: Normal judgment and insight. Alert and oriented x 3. Normal mood.   Labs on Admission: I have personally reviewed following labs and imaging studies  CBC: Recent Labs  Lab 02/28/20 1527  WBC 8.7  NEUTROABS 4.3  HGB 13.2  HCT 40.7  MCV 101.5*  PLT 218   Basic Metabolic  Panel: Recent Labs  Lab 02/28/20 1527  NA 137  K 3.8  CL 101  CO2 26  GLUCOSE 85  BUN 20  CREATININE 1.16*  CALCIUM 9.4   GFR: CrCl cannot be calculated (Unknown ideal weight.). Liver Function Tests: Recent Labs  Lab 02/28/20 1527  AST 25  ALT 16  ALKPHOS 60  BILITOT 0.7  PROT 7.9  ALBUMIN 3.8    Radiological Exams on Admission: DG Chest Portable 1 View  Result Date: 02/28/2020 CLINICAL DATA:  Chest pain. EXAM: PORTABLE CHEST 1 VIEW COMPARISON:  April 18, 2018 FINDINGS: Extensive biapical scarring is noted with suggestion of increasing density at the right lung apex when compared to prior study. The heart size is stable. Aortic calcifications are noted. There is no pneumothorax. The lungs appear to be slightly hyperexpanded. There is no focal infiltrate. IMPRESSION: 1. No acute cardiopulmonary process. 2. Extensive  biapical scarring with suggestion of increasing density at the right lung apex. A follow-up outpatient nonemergent CT of the chest is recommended for further evaluation of this finding. Electronically Signed   By: Katherine Mantle M.D.   On: 02/28/2020 16:06    EKG: Independently reviewed.  Sinus rhythm, QTC 431. No significant ST or T wave abnormalities compared to EKG from 2019.  Assessment/Plan Active Problems:   Chest pain  Chest pain- heart score 4, chest pain improved with nitroglycerin, recurrent in ED. No significant history of coronary artery disease, nuclear stress test 2019 low risk study. EKG, troponin so far unremarkable. -Trend troponin - EKG a.m - echocardiogram -Consider cardiology consultation in a.m, pending clinical course and work-up. - Lipid panel -Aspirin 324mg  given, continue 81 mg daily. -Admit to , no cardiology available at [facility] over the weekend.  HTN-elevated. - PRN Labetalol 10mg , systolic > 170.  CKD 3-creatinine 1.16. Stable.  COPD-stable. -As needed albuterol inhaler  Diastolic CHF-stable and  compensated. Last echo 2019 EF 60 to 65% grade 2 diastolic dysfunction. Takes Lasix 20 mg 3 times as needed on the average 3 times a week. -Resume home Lasix  Symptomatic PVCs-patient follows with cardiologist Dr. . Last visit January 2021, recommended holding amiodarone for 3 months, as patient had problems with balance.  Probable lung cancer- chest x-ray today showing biapical scarring, increased density right lung apex, further evaluation with CT chest recommended. Per cardiology notes patient has elected not to undergo evaluation and treatment for this.    DVT prophylaxis: Lovenox Code Status: Full code  Family Communication: Daughter at bedside Disposition Plan: 1- 2 days Consults called: None. Consider cardiology evaluation in a.m. Admission status: Obs, tele     Ladona Ridgel MD [facility]  02/28/2020, 7:35 PM

## 2020-02-28 NOTE — ED Triage Notes (Signed)
Chest pain onset 0900 today 

## 2020-02-28 NOTE — ED Provider Notes (Signed)
Emergency Department Provider Note   I have reviewed the triage vital signs and the nursing notes.   HISTORY  Chief Complaint Chest Pain   HPI GLENIS MUSOLF is a 86 y.o. female with PMH of CKD, COPD, and HTN presents emergency department for evaluation of central chest heaviness starting at 9 AM this morning.  Patient states that she has never had discomfort like this in her chest before.  She reports occasional sharp feeling in the chest but is mostly heavy and tight.  She denies diaphoresis, vomiting, shortness of breath.  No syncope/near syncope symptoms.  No clear modifying factors.  Patient states occasionally it feels worse with movement but not consistently.  She denies exertional component to discomfort.  She does follow with Dr. Ladona Ridgel with Cardiology but has no ACS history. Last stress test was 2018 with normal perfusion. She had an admit for CP in 2019 thought to be at least partially due to symptomatic bradycardia but no provocative testing at that time.   Past Medical History:  Diagnosis Date  . Chest pain with moderate risk for cardiac etiology 03/30/2017  . CKD (chronic kidney disease), stage III   . COPD (chronic obstructive pulmonary disease) (HCC)   . Gout   . Hypertension   . PVC's (premature ventricular contractions)   . Renal disorder   . Sinus bradycardia   . Tricuspid regurgitation   . Venous insufficiency     Patient Active Problem List   Diagnosis Date Noted  . Chest pain 02/28/2020  . Nodule of right lung 06/30/2019  . Closed right hip fracture, initial encounter (HCC) 06/12/2018  . Lower extremity edema 05/29/2018  . PVC's (premature ventricular contractions) 05/09/2018  . CKD (chronic kidney disease) stage 3, GFR 30-59 ml/min 04/20/2018  . HTN (hypertension) 03/30/2017  . Atypical chest pain 03/30/2017  . Chronic headache disorder 03/30/2017  . Osteoarthritis 03/30/2017  . Venous insufficiency 03/30/2017  . Diverticulosis 03/30/2017  .  Bigeminy 03/30/2017  . DOE (dyspnea on exertion) 03/30/2017  . Idiopathic hypotension 03/30/2017  . Chest pain with moderate risk for cardiac etiology 03/30/2017  . Gout   . Renal disorder     Past Surgical History:  Procedure Laterality Date  . ABDOMINAL HYSTERECTOMY    . arthroscopic left knee    . INTRAMEDULLARY (IM) NAIL INTERTROCHANTERIC Right 06/12/2018   Procedure: INTRAMEDULLARY (IM) NAIL INTERTROCHANTRIC;  Surgeon: Bjorn Pippin, MD;  Location: [facility] OR;  Service: Orthopedics;  Laterality: Right;  . repair left femoral neck    . right medial men      Allergies Latex, Omeprazole, Tramadol, Sulfa antibiotics, and Zanaflex [tizanidine hcl]  Family History  Problem Relation Age of Onset  . Heart attack Mother   . Hypertension Mother   . Early death Father 73       Car accident    Social History Social History   Tobacco Use  . Smoking status: Never Smoker  . Smokeless tobacco: Never Used  Substance Use Topics  . Alcohol use: No  . Drug use: No    Review of Systems  Constitutional: No fever/chills Eyes: No visual changes. ENT: No sore throat. Cardiovascular: Positive chest pain. Respiratory: Denies shortness of breath. Gastrointestinal: No abdominal pain.  No nausea, no vomiting.  No diarrhea.  No constipation. Genitourinary: Negative for dysuria. Musculoskeletal: Negative for back pain. Skin: Negative for rash. Neurological: Negative for headaches, focal weakness or numbness.  10-point ROS otherwise negative.  ____________________________________________   PHYSICAL EXAM:  VITAL  SIGNS: ED Triage Vitals [02/28/20 1441]  Enc Vitals Group     BP (!) 189/98     Pulse Rate 72     Resp 20     Temp 98 F (36.7 C)     Temp src      SpO2 98 %   Constitutional: Alert and oriented. Well appearing and in no acute distress. Eyes: Conjunctivae are normal. Head: Atraumatic. Nose: No congestion/rhinnorhea. Mouth/Throat: Mucous membranes are moist.  Neck: No  stridor.  Cardiovascular: Normal rate, regular rhythm. Good peripheral circulation. Grossly normal heart sounds. No tenderness to palpation over the anterior chest wall.  Respiratory: Normal respiratory effort.  No retractions. Lungs CTAB. Gastrointestinal: Soft and nontender. No distention.  Musculoskeletal: No lower extremity tenderness nor edema. No gross deformities of extremities. Neurologic:  Normal speech and language.  Skin:  Skin is warm, dry and intact. No rash noted.   ____________________________________________   LABS (all labs ordered are listed, but only abnormal results are displayed)  Labs Reviewed  COMPREHENSIVE METABOLIC PANEL - Abnormal; Notable for the following components:      Result Value   Creatinine, Ser 1.16 (*)    GFR calc non Af Amer 44 (*)    GFR calc Af Amer 50 (*)    All other components within normal limits  CBC WITH DIFFERENTIAL/PLATELET - Abnormal; Notable for the following components:   MCV 101.5 (*)    All other components within normal limits  RESPIRATORY PANEL BY RT PCR (FLU A&B, COVID)  TROPONIN I (HIGH SENSITIVITY)  TROPONIN I (HIGH SENSITIVITY)  TROPONIN I (HIGH SENSITIVITY)   ____________________________________________  EKG   EKG Interpretation  Date/Time:  Saturday Feb 28 2020 14:33:51 EDT Ventricular Rate:  66 PR Interval:  200 QRS Duration: 96 QT Interval:  412 QTC Calculation: 431 R Axis:   -52 Text Interpretation: Normal sinus rhythm Left axis deviation Septal infarct , age undetermined Abnormal ECG No STEMI Confirmed by Alona Bene [phone]) on 02/28/2020 3:16:43 PM       ____________________________________________  RADIOLOGY  DG Chest Portable 1 View  Result Date: 02/28/2020 CLINICAL DATA:  Chest pain. EXAM: PORTABLE CHEST 1 VIEW COMPARISON:  April 18, 2018 FINDINGS: Extensive biapical scarring is noted with suggestion of increasing density at the right lung apex when compared to prior study. The heart size is stable.  Aortic calcifications are noted. There is no pneumothorax. The lungs appear to be slightly hyperexpanded. There is no focal infiltrate. IMPRESSION: 1. No acute cardiopulmonary process. 2. Extensive biapical scarring with suggestion of increasing density at the right lung apex. A follow-up outpatient nonemergent CT of the chest is recommended for further evaluation of this finding. Electronically Signed   By: Katherine Mantle M.D.   On: 02/28/2020 16:06    ____________________________________________   PROCEDURES  Procedure(s) performed:   Procedures  None  ____________________________________________   INITIAL IMPRESSION / ASSESSMENT AND PLAN / ED COURSE  Pertinent labs & imaging results that were available during my care of the patient were reviewed by me and considered in my medical decision making (see chart for details).   Patient presents to the emergency department for evaluation of chest discomfort starting this morning.  Low suspicion clinically for PE or aortic pathology.  Patient did take nitroglycerin without relief.  Will give full dose aspirin here along with additional nitroglycerin.  Her blood pressure is elevated on arrival.  Patient with questionable density of the right lung apex with radiology recommending outpatient nonemergent CT of the  chest for further characterization.  Patient's HEART score 5 (moderate risk).   Discussed patient's case with [facility], Dr. Denton Brick to request admission. Patient and family (if present) updated with plan. Care transferred to [facility] service.  I reviewed all nursing notes, vitals, pertinent old records, EKGs, labs, imaging (as available).  ____________________________________________  FINAL CLINICAL IMPRESSION(S) / ED DIAGNOSES  Final diagnoses:  Precordial chest pain    MEDICATIONS GIVEN DURING THIS VISIT:  Medications  aspirin chewable tablet 324 mg (324 mg Oral Given 02/28/20 1547)  nitroGLYCERIN (NITROSTAT) SL tablet 0.4 mg (0.4 mg  Sublingual Given 02/28/20 1547)     Note:  This document was prepared using Dragon voice recognition software and may include unintentional dictation errors.  Nanda Quinton, MD, [facility] Emergency Medicine    Kitrina Maurin, [facility], MD 02/28/20 2116

## 2020-02-29 ENCOUNTER — Observation Stay (HOSPITAL_COMMUNITY): Payer: Medicare Other

## 2020-02-29 ENCOUNTER — Observation Stay (HOSPITAL_BASED_OUTPATIENT_CLINIC_OR_DEPARTMENT_OTHER): Payer: Medicare Other

## 2020-02-29 DIAGNOSIS — N1831 Chronic kidney disease, stage 3a: Secondary | ICD-10-CM

## 2020-02-29 DIAGNOSIS — R072 Precordial pain: Secondary | ICD-10-CM

## 2020-02-29 DIAGNOSIS — J479 Bronchiectasis, uncomplicated: Secondary | ICD-10-CM

## 2020-02-29 DIAGNOSIS — I5032 Chronic diastolic (congestive) heart failure: Secondary | ICD-10-CM | POA: Diagnosis not present

## 2020-02-29 DIAGNOSIS — R079 Chest pain, unspecified: Secondary | ICD-10-CM

## 2020-02-29 DIAGNOSIS — J449 Chronic obstructive pulmonary disease, unspecified: Secondary | ICD-10-CM | POA: Diagnosis not present

## 2020-02-29 DIAGNOSIS — I10 Essential (primary) hypertension: Secondary | ICD-10-CM | POA: Diagnosis present

## 2020-02-29 DIAGNOSIS — I130 Hypertensive heart and chronic kidney disease with heart failure and stage 1 through stage 4 chronic kidney disease, or unspecified chronic kidney disease: Secondary | ICD-10-CM | POA: Diagnosis not present

## 2020-02-29 DIAGNOSIS — E7800 Pure hypercholesterolemia, unspecified: Secondary | ICD-10-CM

## 2020-02-29 DIAGNOSIS — N183 Chronic kidney disease, stage 3 unspecified: Secondary | ICD-10-CM | POA: Diagnosis present

## 2020-02-29 LAB — CBC WITH DIFFERENTIAL/PLATELET
Abs Immature Granulocytes: 0.03 10*3/uL (ref 0.00–0.07)
Basophils Absolute: 0.1 10*3/uL (ref 0.0–0.1)
Basophils Relative: 1 %
Eosinophils Absolute: 0.2 10*3/uL (ref 0.0–0.5)
Eosinophils Relative: 3 %
HCT: 38.5 % (ref 36.0–46.0)
Hemoglobin: 12.7 g/dL (ref 12.0–15.0)
Immature Granulocytes: 0 %
Lymphocytes Relative: 40 %
Lymphs Abs: 3 10*3/uL (ref 0.7–4.0)
MCH: 32.8 pg (ref 26.0–34.0)
MCHC: 33 g/dL (ref 30.0–36.0)
MCV: 99.5 fL (ref 80.0–100.0)
Monocytes Absolute: 0.4 10*3/uL (ref 0.1–1.0)
Monocytes Relative: 6 %
Neutro Abs: 3.7 10*3/uL (ref 1.7–7.7)
Neutrophils Relative %: 50 %
Platelets: 213 10*3/uL (ref 150–400)
RBC: 3.87 MIL/uL (ref 3.87–5.11)
RDW: 14.2 % (ref 11.5–15.5)
WBC: 7.5 10*3/uL (ref 4.0–10.5)
nRBC: 0 % (ref 0.0–0.2)

## 2020-02-29 LAB — LIPID PANEL
Cholesterol: 144 mg/dL (ref 0–200)
HDL: 43 mg/dL (ref >40)
LDL Cholesterol: 66 mg/dL (ref 0–99)
Total CHOL/HDL Ratio: 3.3 RATIO
Triglycerides: 175 mg/dL — ABNORMAL HIGH (ref <150)
VLDL: 35 mg/dL (ref 0–40)

## 2020-02-29 LAB — PHOSPHORUS: Phosphorus: 3.7 mg/dL (ref 2.5–4.6)

## 2020-02-29 LAB — COMPREHENSIVE METABOLIC PANEL
ALT: 15 U/L (ref 0–44)
AST: 26 U/L (ref 15–41)
Albumin: 3.4 g/dL — ABNORMAL LOW (ref 3.5–5.0)
Alkaline Phosphatase: 54 U/L (ref 38–126)
Anion gap: 12 (ref 5–15)
BUN: 14 mg/dL (ref 8–23)
CO2: 23 mmol/L (ref 22–32)
Calcium: 9.1 mg/dL (ref 8.9–10.3)
Chloride: 104 mmol/L (ref 98–111)
Creatinine, Ser: 1.13 mg/dL — ABNORMAL HIGH (ref 0.44–1.00)
GFR calc Af Amer: 52 mL/min — ABNORMAL LOW (ref >60)
GFR calc non Af Amer: 45 mL/min — ABNORMAL LOW (ref >60)
Glucose, Bld: 125 mg/dL — ABNORMAL HIGH (ref 70–99)
Potassium: 4.1 mmol/L (ref 3.5–5.1)
Sodium: 139 mmol/L (ref 135–145)
Total Bilirubin: 0.9 mg/dL (ref 0.3–1.2)
Total Protein: 7.3 g/dL (ref 6.5–8.1)

## 2020-02-29 LAB — MAGNESIUM: Magnesium: 2.2 mg/dL (ref 1.7–2.4)

## 2020-02-29 MED ORDER — LABETALOL HCL 5 MG/ML IV SOLN
10.0000 mg | INTRAVENOUS | Status: DC | PRN
  Administered 2020-02-29: 10 mg via INTRAVENOUS
  Filled 2020-02-29: qty 4

## 2020-02-29 MED ORDER — ACETAMINOPHEN 650 MG RE SUPP: 650.0000 mg | Freq: Four times a day (QID) | RECTAL | Status: DC | PRN

## 2020-02-29 MED ORDER — ONDANSETRON HCL 4 MG PO TABS: 4.0000 mg | ORAL_TABLET | Freq: Four times a day (QID) | ORAL | Status: DC | PRN

## 2020-02-29 MED ORDER — ENOXAPARIN SODIUM 40 MG/0.4ML ~~LOC~~ SOLN
40.0000 mg | SUBCUTANEOUS | Status: DC
  Administered 2020-02-29 (×2): 40 mg via SUBCUTANEOUS
  Filled 2020-02-29 (×2): qty 0.4

## 2020-02-29 MED ORDER — ASPIRIN 81 MG PO CHEW
81.0000 mg | CHEWABLE_TABLET | Freq: Every day | ORAL | Status: DC
  Administered 2020-02-29 – 2020-03-01 (×2): 81 mg via ORAL
  Filled 2020-02-29 (×2): qty 1

## 2020-02-29 MED ORDER — AMLODIPINE BESYLATE 2.5 MG PO TABS
2.5000 mg | ORAL_TABLET | Freq: Every day | ORAL | Status: DC
  Administered 2020-02-29 – 2020-03-01 (×2): 2.5 mg via ORAL
  Filled 2020-02-29 (×2): qty 1

## 2020-02-29 MED ORDER — POLYETHYLENE GLYCOL 3350 17 G PO PACK: 17.0000 g | PACK | Freq: Every day | ORAL | Status: DC | PRN

## 2020-02-29 MED ORDER — FUROSEMIDE 20 MG PO TABS: 20.0000 mg | ORAL_TABLET | Freq: Every day | ORAL | Status: DC | PRN

## 2020-02-29 MED ORDER — LEVALBUTEROL HCL 1.25 MG/0.5ML IN NEBU: 1.2500 mg | INHALATION_SOLUTION | Freq: Four times a day (QID) | RESPIRATORY_TRACT | Status: DC | PRN

## 2020-02-29 MED ORDER — ALLOPURINOL 100 MG PO TABS
100.0000 mg | ORAL_TABLET | Freq: Every day | ORAL | Status: DC
  Administered 2020-02-29 (×2): 100 mg via ORAL
  Filled 2020-02-29 (×2): qty 1

## 2020-02-29 MED ORDER — ACETAMINOPHEN 325 MG PO TABS: 650.0000 mg | ORAL_TABLET | Freq: Four times a day (QID) | ORAL | Status: DC | PRN

## 2020-02-29 MED ORDER — ALBUTEROL SULFATE (2.5 MG/3ML) 0.083% IN NEBU: 2.5000 mg | INHALATION_SOLUTION | RESPIRATORY_TRACT | Status: DC | PRN

## 2020-02-29 MED ORDER — ONDANSETRON HCL 4 MG/2ML IJ SOLN: 4.0000 mg | Freq: Four times a day (QID) | INTRAMUSCULAR | Status: DC | PRN

## 2020-02-29 MED ORDER — NITROGLYCERIN 0.4 MG SL SUBL: 0.4000 mg | SUBLINGUAL_TABLET | SUBLINGUAL | Status: DC | PRN

## 2020-02-29 NOTE — Progress Notes (Signed)
Ortho's VS: Lying 142/77, 69 Sitting 150/76, 73 Standing 146/81, 81 standing 3 min 150/59, 80 Thanks, Lavonda Jumbo RN

## 2020-02-29 NOTE — Progress Notes (Signed)
*  PRELIMINARY RESULTS* Echocardiogram 2D Echocardiogram has been performed.  Rhonda Spears 02/29/2020, 11:48 AM

## 2020-02-29 NOTE — Progress Notes (Signed)
PROGRESS NOTE    Rhonda Spears  OJJ:[id number] DOB: 03/05/1934 DOA: 02/28/2020 PCP: Suzan Slick, MD     Brief Narrative:  86 y.o. WF PMHx essential HTN (uncontrolled), TV regurgitation, COPD, CKD stage III   Presented to the ED with complaints of chest pain that started at about 9 AM this morning when she woke up, she reports chest pressure mid to left side of her chest, with tightness radiating to her left arm and jaw. She reports 2 hours later at about 11 AM pain became sharp. Pain is not related to activity. No prior episodes of similar chest pain. Chest pain lasts few seconds and resolves. Reports several episodes. She was given aspirin and nitro in the ED, and chest pain severity has improved. She has chronic unchanged difficulty breathing, and cough.  ED Course: Blood pressure elevated systolic 180s.  Troponin unremarkable at 4.  EKG with sinus rhythm without significant ST or T wave abnormalities.  Portable chest x-ray shows biapical scarring, increased density right lung apex, nonemergent follow-up CT chest recommended.  With concern for ACS, recurrence of intermittent pain, hospitalist to admit for chest pain.   Subjective: A/O x4, states he had been on Amiodarone but was told to discontinue on 11/26/2019 secondary to presyncope episodes for at least 3 months..  Sounds as if patient was bradycardic from her description of symptoms.  States she takes her blood pressure at home often and it is elevated.   Assessment & Plan:   Active Problems:   Chest pain   Bronchiectasis (HCC)   Chronic diastolic CHF (congestive heart failure) (HCC)   CKD (chronic kidney disease), stage III   Essential hypertension  Chest pain - heart score 4, chest pain improved with nitroglycerin, recurrent in ED. No significant history of coronary artery disease, nuclear stress test 2019 low risk study. EKG, troponin so far unremarkable. -Trend troponin Results for Rhonda Spears (MRN [id number])  as of 02/29/2020 14:27  Ref. Range 02/28/2020 15:27 02/28/2020 17:25 02/28/2020 20:45  Troponin I (High Sensitivity) Latest Ref Range  <18 ng/L 4 4 5   -EKG; first-degree AV block. -Aspirin 324mg  given, continue 81 mg daily.  COPD -Stable. -Xopenex nebulized PRN  Bronchiectasis -PCXR showing biapical scarring, increased density right lung apex, see results below.  -5/2 CT chest; consistent with bronchiectasis.  No features to indicate lung cancer. -Schedule establish care appointment in 1 to 2 weeks with Dr. PCCM work-up bronchiectasis/inflammatory bronchiolitis.    Chronic Diastolic CHF  -5/2 echocardiogram; diastolic CHF see results below -Strict in and out -Daily weight -Amlodipine 2.5 mg daily; patient had been on amiodarone previously from description of symptoms sounds as if she was bradycardic. -Continue Lasix PRN (home regimen) -Obtain orthostatic vitals.  Ambulate patient in A.m. and if BP controlled and tolerating new medication will discharge. -5/2 schedule follow-up appointment in 2 weeks with Dr. cardiology, chronic diastolic CHF, essential HTN - Essential HTN (uncontrolled) -Furosemide 20 mg daily PRN -Labetalol 10 mg  PRN - PRN Labetalol 10mg , systolic > 170.  Symptomatic PVCs -patient follows with cardiologist Dr. Chilton Greathouse. Last visit January 2021, recommended holding amiodarone for 3 months, as patient had problems with balance.  CKD stage IIIa (baseline Cr 1.16) Recent Labs  Lab 02/28/20 1527 02/29/20 0807  CREATININE 1.16* 1.13*  ]  HLD -5/2 LDL= 66 -Controlled.  Patient since that medication will allow her PCP to decide if/when began statin     DVT prophylaxis: Lovenox Code Status: Full Family Communication:  5/2 husband at bedside discussed plan of care answered all questions Disposition Plan:  1.  Where the patient is from 2.  Anticipated d/c place. 3.  Barriers to d/c if patient BP stable on a.m. discharge   Consultants:     Procedures/Significant Events:  5/1 PCXR; no acute cardiopulmonary process. -Extensive biapical scarring with suggestion of increasing density at the right lung apex.  5/2 echocardiogram;Left Ventricle: LVEF=60 to 65%.  -Grade I diastolic dysfunction (impaired relaxation).  5/2 CT chest Wo contrast; .-Widespread moderate cylindrical and varicoid bronchiectasis in lungs, most prominent in the medial upper lobes and medial right middle lobe. Scattered mucoid impaction and bronchial wall thickening at the areas of bronchiectasis. Findings are compatible with chronic infectious or inflammatory bronchiolitis such as due to atypical mycobacterial infection (MAI). -Extensive patchy subpleural reticulation and irregular bandlike consolidation, most prominent at the lung apices, unchanged,compatible with chronic postinfectious scarring. - New mild right pericardiophrenic lymphadenopathy, nonspecific.Recommend attention on follow-up chest CT with IV contrast in 3 months. -4. Three-vessel coronary atherosclerosis. -5. Small hiatal hernia. .     I have personally reviewed and interpreted all radiology studies and my findings are as above.  VENTILATOR SETTINGS:    Cultures   Antimicrobials:    Devices    LINES / TUBES:      Continuous Infusions:   Objective: Vitals:   02/29/20 0139 02/29/20 0423 02/29/20 0453 02/29/20 0804  BP: 127/64 (!) 106/57 107/72 (!) 125/53  Pulse: 60 66 (!) 58 70  Resp:  16 16 16   Temp:  (!) 97.5 F (36.4 C) 97.9 F (36.6 C) 98.2 F (36.8 C)  TempSrc:  Oral Oral Oral  SpO2: 94% 94% 97% 94%  Weight:      Height:        Intake/Output Summary (Last 24 hours) at 02/29/2020 1755 Last data filed at 02/29/2020 1400 Gross per 24 hour  Intake 517 ml  Output 1100 ml  Net -583 ml   Filed Weights   02/29/20 0004  Weight: 63.9 kg    Examination:  General: A/O x4, no acute respiratory distress Eyes: negative scleral hemorrhage, negative  anisocoria, negative icterus ENT: Negative Runny nose, negative gingival bleeding, Neck:  Negative scars, masses, torticollis, lymphadenopathy, JVD Lungs: Clear to auscultation bilaterally without wheezes or crackles Cardiovascular: Regular rate and rhythm without murmur gallop or rub normal S1 and S2 Abdomen: negative abdominal pain, nondistended, positive soft, bowel sounds, no rebound, no ascites, no appreciable mass Extremities: No significant cyanosis, clubbing, or edema bilateral lower extremities Skin: Negative rashes, lesions, ulcers Psychiatric:  Negative depression, negative anxiety, negative fatigue, negative mania  Central nervous system:  Cranial nerves II through XII intact, tongue/uvula midline, all extremities muscle strength 5/5, sensation intact throughout, negative dysarthria, negative expressive aphasia, negative receptive aphasia.  .     Data Reviewed: Care during the described time interval was provided by me .  I have reviewed this patient's available data, including medical history, events of note, physical examination, and all test results as part of my evaluation.  CBC: Recent Labs  Lab 02/28/20 1527 02/29/20 0807  WBC 8.7 7.5  NEUTROABS 4.3 3.7  HGB 13.2 12.7  HCT 40.7 38.5  MCV 101.5* 99.5  PLT 218 213   Basic Metabolic Panel: Recent Labs  Lab 02/28/20 1527 02/29/20 0807  NA 137 139  K 3.8 4.1  CL 101 104  CO2 26 23  GLUCOSE 85 125*  BUN 20 14  CREATININE 1.16* 1.13*  CALCIUM 9.4 9.1  MG  --  2.2  PHOS  --  3.7   GFR: Estimated Creatinine Clearance: 32.3 mL/min (A) (by C-G formula based on SCr of 1.13 mg/dL (H)). Liver Function Tests: Recent Labs  Lab 02/28/20 1527 02/29/20 0807  AST 25 26  ALT 16 15  ALKPHOS 60 54  BILITOT 0.7 0.9  PROT 7.9 7.3  ALBUMIN 3.8 3.4*   No results for input(s): LIPASE, AMYLASE in the last 168 hours. No results for input(s): AMMONIA in the last 168 hours. Coagulation Profile: No results for input(s):  INR, PROTIME in the last 168 hours. Cardiac Enzymes: No results for input(s): CKTOTAL, CKMB, CKMBINDEX, TROPONINI in the last 168 hours. BNP (last 3 results) No results for input(s): PROBNP in the last 8760 hours. HbA1C: No results for input(s): HGBA1C in the last 72 hours. CBG: No results for input(s): GLUCAP in the last 168 hours. Lipid Profile: Recent Labs    02/29/20 0632  CHOL 144  HDL 43  LDLCALC 66  TRIG 175*  CHOLHDL 3.3   Thyroid Function Tests: No results for input(s): TSH, T4TOTAL, FREET4, T3FREE, THYROIDAB in the last 72 hours. Anemia Panel: No results for input(s): VITAMINB12, FOLATE, FERRITIN, TIBC, IRON, RETICCTPCT in the last 72 hours. Sepsis Labs: No results for input(s): PROCALCITON, LATICACIDVEN in the last 168 hours.  Recent Results (from the past 240 hour(s))  Respiratory Panel by RT PCR (Flu A&B, Covid) - Nasopharyngeal Swab     Status: None   Collection Time: 02/28/20  4:34 PM   Specimen: Nasopharyngeal Swab  Result Value Ref Range Status   SARS Coronavirus 2 by RT PCR NEGATIVE NEGATIVE Final    Comment: (NOTE) SARS-CoV-2 target nucleic acids are NOT DETECTED. The SARS-CoV-2 RNA is generally detectable in upper respiratoy specimens during the acute phase of infection. The lowest concentration of SARS-CoV-2 viral copies this assay can detect is 131 copies/mL. A negative result does not preclude SARS-Cov-2 infection and should not be used as the sole basis for treatment or other patient management decisions. A negative result may occur with  improper specimen collection/handling, submission of specimen other than nasopharyngeal swab, presence of viral mutation(s) within the areas targeted by this assay, and inadequate number of viral copies (<131 copies/mL). A negative result must be combined with clinical observations, patient history, and epidemiological information. The expected result is Negative. Fact Sheet for Patients:   https://www.moore.com/ Fact Sheet for Healthcare Providers:  https://www.young.biz/ This test is not yet ap proved or cleared by the [place] FDA and  has been authorized for detection and/or diagnosis of SARS-CoV-2 by FDA under an Emergency Use Authorization (EUA). This EUA will remain  in effect (meaning this test can be used) for the duration of the COVID-19 declaration under Section 564(b)(1) of the Act, 21 U.S.C. section 360bbb-3(b)(1), unless the authorization is terminated or revoked sooner.    Influenza A by PCR NEGATIVE NEGATIVE Final   Influenza B by PCR NEGATIVE NEGATIVE Final    Comment: (NOTE) The Xpert Xpress SARS-CoV-2/FLU/RSV assay is intended as an aid in  the diagnosis of influenza from Nasopharyngeal swab specimens and  should not be used as a sole basis for treatment. Nasal washings and  aspirates are unacceptable for Xpert Xpress SARS-CoV-2/FLU/RSV  testing. Fact Sheet for Patients: https://www.moore.com/ Fact Sheet for Healthcare Providers: https://www.young.biz/ This test is not yet approved or cleared by the [place] FDA and  has been authorized for detection and/or diagnosis of SARS-CoV-2 by  FDA under an Emergency Use Authorization (  EUA). This EUA will remain  in effect (meaning this test can be used) for the duration of the  Covid-19 declaration under Section 564(b)(1) of the Act, 21  U.S.C. section 360bbb-3(b)(1), unless the authorization is  terminated or revoked. Performed at [facility], [address], [place], [place] [address]          Radiology Studies: CT CHEST WO CONTRAST  Result Date: 02/29/2020 CLINICAL DATA:  Inpatient. Abnormal chest radiograph. EXAM: CT CHEST WITHOUT CONTRAST TECHNIQUE: Multidetector CT imaging of the chest was performed following the standard protocol without IV contrast. COMPARISON:  Chest radiograph from one day prior. 04/19/2018  chest CT angiogram. FINDINGS: Cardiovascular: Normal heart size. No significant pericardial effusion/thickening. Three-vessel coronary atherosclerosis. Atherosclerotic nonaneurysmal thoracic aorta. Normal caliber pulmonary arteries. Mediastinum/Nodes: No discrete thyroid nodules. Unremarkable esophagus. No axillary adenopathy. Newly mildly enlarged 1.1 cm right pericardiophrenic node (series 3/image 111). No additional pathologically enlarged mediastinal nodes. No discrete hilar adenopathy on these noncontrast images. Lungs/Pleura: No pneumothorax. No pleural effusion. No acute consolidative airspace disease or lung masses. A few scattered solid pulmonary nodules in both lungs measuring up to 5 mm in the peripheral left lower lobe (series 4/image 99), all stable since 04/19/18 chest CT, considered benign. No new significant pulmonary nodules. Widespread moderate cylindrical and varicoid bronchiectasis throughout both lungs, most prominent in the medial upper lobes and medial right middle lobe. Scattered mucoid impaction and bronchial wall thickening at the areas of bronchiectasis. Extensive patchy subpleural reticulation and irregular bandlike consolidation in both lungs, most prominent in the apical lungs, unchanged and compatible with chronic postinfectious scarring. Upper abdomen: Small hiatal hernia. Musculoskeletal: No aggressive appearing focal osseous lesions. Marked thoracic spondylosis. IMPRESSION: 1. Widespread moderate cylindrical and varicoid bronchiectasis in lungs, most prominent in the medial upper lobes and medial right middle lobe. Scattered mucoid impaction and bronchial wall thickening at the areas of bronchiectasis. Findings are compatible with chronic infectious or inflammatory bronchiolitis such as due to atypical mycobacterial infection (MAI). 2. Extensive patchy subpleural reticulation and irregular bandlike consolidation, most prominent at the lung apices, unchanged, compatible with chronic  postinfectious scarring. 3. New mild right pericardiophrenic lymphadenopathy, nonspecific. Recommend attention on follow-up chest CT with IV contrast in 3 months. 4. Three-vessel coronary atherosclerosis. 5. Small hiatal hernia. 6. Aortic Atherosclerosis (ICD10-I70.0). Electronically Signed   By: Ilona Sorrel M.D.   On: 02/29/2020 09:48   DG Chest Portable 1 View  Result Date: 02/28/2020 CLINICAL DATA:  Chest pain. EXAM: PORTABLE CHEST 1 VIEW COMPARISON:  April 18, 2018 FINDINGS: Extensive biapical scarring is noted with suggestion of increasing density at the right lung apex when compared to prior study. The heart size is stable. Aortic calcifications are noted. There is no pneumothorax. The lungs appear to be slightly hyperexpanded. There is no focal infiltrate. IMPRESSION: 1. No acute cardiopulmonary process. 2. Extensive biapical scarring with suggestion of increasing density at the right lung apex. A follow-up outpatient nonemergent CT of the chest is recommended for further evaluation of this finding. Electronically Signed   By: Constance Holster M.D.   On: 02/28/2020 16:06   ECHOCARDIOGRAM COMPLETE  Result Date: 02/29/2020    ECHOCARDIOGRAM REPORT   Patient Name:   Rhonda Spears Date of Exam: 02/29/2020 Medical Rec #:  [id number]        Height:       61.0 in Accession #:    [id number]       Weight:       140.8 lb Date of Birth:  [birth year]         BSA:          1.627 m Patient Age:    85 years         BP:           123/53 mmHg Patient Gender: F                HR:           70 bpm. Exam Location:  Inpatient Procedure: 2D Echo Indications:    Chest Pain 786.50 / R07.9  History:        Patient has prior history of Echocardiogram examinations, most                 recent 04/19/2018. COPD, Signs/Symptoms:Chest Pain; Risk                 Factors:Hypertension and Non-Smoker. Idiopathic hypotension,                 Sinus bradycardia , [place].  Sonographer:    Jeryl Columbia Referring Phys: [phone] Heloise Beecham  [place] IMPRESSIONS  1. Left ventricular ejection fraction, by estimation, is 60 to 65%. The left ventricle has normal function. The left ventricle has no regional wall motion abnormalities. There is mild concentric left ventricular hypertrophy. Left ventricular diastolic parameters are consistent with Grade I diastolic dysfunction (impaired relaxation).  2. Right ventricular systolic function is normal. The right ventricular size is normal. There is normal pulmonary artery systolic pressure. The estimated right ventricular systolic pressure is 28.6 mmHg.  3. Left atrial size was mildly dilated.  4. The mitral valve is normal in structure. Mild mitral valve regurgitation. No evidence of mitral stenosis.  5. Tricuspid valve regurgitation is moderate.  6. The aortic valve is normal in structure. Aortic valve regurgitation is not visualized. No aortic stenosis is present.  7. The inferior vena cava is normal in size with greater than 50% respiratory variability, suggesting right atrial pressure of 3 mmHg. FINDINGS  Left Ventricle: Left ventricular ejection fraction, by estimation, is 60 to 65%. The left ventricle has normal function. The left ventricle has no regional wall motion abnormalities. The left ventricular internal cavity size was normal in size. There is  mild concentric left ventricular hypertrophy. Left ventricular diastolic parameters are consistent with Grade I diastolic dysfunction (impaired relaxation). Normal left ventricular filling pressure. Right Ventricle: The right ventricular size is normal. No increase in right ventricular wall thickness. Right ventricular systolic function is normal. There is normal pulmonary artery systolic pressure. The tricuspid regurgitant velocity is 2.53 m/s, and  with an assumed right atrial pressure of 3 mmHg, the estimated right ventricular systolic pressure is 28.6 mmHg. Left Atrium: Left atrial size was mildly dilated. Right Atrium: Right atrial size was normal in  size. Pericardium: There is no evidence of pericardial effusion. Mitral Valve: The mitral valve is normal in structure. Normal mobility of the mitral valve leaflets. Mild mitral valve regurgitation. No evidence of mitral valve stenosis. Tricuspid Valve: The tricuspid valve is normal in structure. Tricuspid valve regurgitation is moderate . No evidence of tricuspid stenosis. Aortic Valve: The aortic valve is normal in structure. Aortic valve regurgitation is not visualized. No aortic stenosis is present. Pulmonic Valve: The pulmonic valve was normal in structure. Pulmonic valve regurgitation is not visualized. No evidence of pulmonic stenosis. Aorta: The aortic root is normal in size and structure. Venous: The inferior vena cava is normal in size with greater than 50% respiratory  variability, suggesting right atrial pressure of 3 mmHg. IAS/Shunts: No atrial level shunt detected by color flow Doppler.  LEFT VENTRICLE PLAX 2D LVIDd:         3.60 cm  Diastology LVIDs:         2.40 cm  LV e' lateral:   7.29 cm/s LV PW:         1.60 cm  LV E/e' lateral: 9.5 LV IVS:        1.20 cm  LV e' medial:    6.53 cm/s LVOT diam:     1.90 cm  LV E/e' medial:  10.6 LVOT Area:     2.84 cm  RIGHT VENTRICLE RV S prime:     13.40 cm/s TAPSE (M-mode): 1.7 cm LEFT ATRIUM             Index       RIGHT ATRIUM          Index LA diam:        3.80 cm 2.34 cm/m  RA Area:     9.19 cm LA Vol (A2C):   46.3 ml 28.46 ml/m RA Volume:   17.40 ml 10.69 ml/m LA Vol (A4C):   63.5 ml 39.03 ml/m LA Biplane Vol: 57.2 ml 35.16 ml/m   AORTA Ao Root diam: 3.20 cm MITRAL VALVE               TRICUSPID VALVE MV Area (PHT): 2.69 cm    TR Peak grad:   25.6 mmHg MV Decel Time: 282 msec    TR Vmax:        253.00 cm/s MV E velocity: 69.40 cm/s MV A velocity: 66.00 cm/s  SHUNTS MV E/A ratio:  1.05        Systemic Diam: 1.90 cm Tobias Alexander MD Electronically signed by Tobias Alexander MD Signature Date/Time: 02/29/2020/12:08:52 PM    Final         Scheduled  Meds: . allopurinol  100 mg Oral QHS  . amLODipine  2.5 mg Oral Daily  . aspirin  81 mg Oral Daily  . enoxaparin (LOVENOX) injection  40 mg Subcutaneous Q24H   Continuous Infusions:   LOS: 0 days    Time spent:40 min    Erla Bacchi, Roselind Messier, MD [facility] Pager [phone]  If 7PM-7AM, please contact night-coverage [email or web address] Password [facility] 02/29/2020, 5:55 PM

## 2020-02-29 NOTE — Plan of Care (Signed)
  Problem: Activity: Goal: Risk for activity intolerance will decrease Outcome: Completed/Met   Problem: Nutrition: Goal: Adequate nutrition will be maintained Outcome: Completed/Met   Problem: Coping: Goal: Level of anxiety will decrease Outcome: Completed/Met   Problem: Elimination: Goal: Will not experience complications related to bowel motility Outcome: Completed/Met Goal: Will not experience complications related to urinary retention Outcome: Completed/Met   Problem: Pain Managment: Goal: General experience of comfort will improve Outcome: Completed/Met   Problem: Safety: Goal: Ability to remain free from injury will improve Outcome: Completed/Met   Problem: Skin Integrity: Goal: Risk for impaired skin integrity will decrease Outcome: Completed/Met

## 2020-03-01 DIAGNOSIS — J479 Bronchiectasis, uncomplicated: Secondary | ICD-10-CM | POA: Diagnosis not present

## 2020-03-01 DIAGNOSIS — N1831 Chronic kidney disease, stage 3a: Secondary | ICD-10-CM | POA: Diagnosis not present

## 2020-03-01 DIAGNOSIS — I5032 Chronic diastolic (congestive) heart failure: Secondary | ICD-10-CM | POA: Diagnosis not present

## 2020-03-01 DIAGNOSIS — R072 Precordial pain: Secondary | ICD-10-CM | POA: Diagnosis not present

## 2020-03-01 DIAGNOSIS — R079 Chest pain, unspecified: Secondary | ICD-10-CM | POA: Diagnosis not present

## 2020-03-01 MED ORDER — NITROGLYCERIN 0.4 MG SL SUBL: 0.4000 mg | SUBLINGUAL_TABLET | SUBLINGUAL | 0 refills | Status: AC | PRN

## 2020-03-01 MED ORDER — POLYETHYLENE GLYCOL 3350 17 G PO PACK: 17.0000 g | PACK | Freq: Every day | ORAL | 0 refills | Status: DC | PRN

## 2020-03-01 MED ORDER — ONDANSETRON HCL 4 MG PO TABS: 4.0000 mg | ORAL_TABLET | Freq: Four times a day (QID) | ORAL | 0 refills | Status: DC | PRN

## 2020-03-01 MED ORDER — AMLODIPINE BESYLATE 2.5 MG PO TABS: 2.5000 mg | ORAL_TABLET | Freq: Every day | ORAL | 0 refills | Status: DC

## 2020-03-01 MED FILL — AMLODIPINE BESYLATE 2.5 MG: 2.5 | 30 days supply | Qty: 30 | Fill #0

## 2020-03-01 MED FILL — NITROGLYCERIN 0.4 MG TAB SL: 0.4 | 8 days supply | Qty: 25 | Fill #0

## 2020-03-01 NOTE — Discharge Summary (Signed)
Physician Discharge Summary  Rhonda Spears WCB:[id number] DOB: 1934/02/25 DOA: 02/28/2020  PCP: Leeanne Rio, MD  Admit date: 02/28/2020 Discharge date: 03/01/2020  Time spent: 30 minutes  Recommendations for Outpatient Follow-up:Chest pain -heart score 4, chest pain improved with nitroglycerin,recurrent in ED.No significant history of coronary artery disease, nuclear stress test 2019 low risk study. EKG, troponin so far unremarkable. -Trend troponin Results for Rhonda, Spears (MRN [id number]) as of 03/01/2020 09:24  Ref. Range 02/28/2020 15:27 02/28/2020 17:25 02/28/2020 20:45  Troponin I (High Sensitivity) Latest Ref Range: <18 ng/L 4 4 5   -EKG; first-degree AV block. -Aspirin 324mg  given,continue 81 mg daily.  COPD -Stable. -Xopenex nebulized PRN  Bronchiectasis -PCXR showing biapical scarring, increased density right lung apex, see results below.  -5/2 CT chest; consistent with bronchiectasis.  No features to indicate lung cancer. -Schedule establish care appointment in 1 to 2 weeks with Dr. Marshell Garfinkel PCCM work-up bronchiectasis/inflammatory bronchiolitis.    Chronic Diastolic CHF  -5/2 echocardiogram; diastolic CHF see results below -Strict in and out -Daily weight -Amlodipine 2.5 mg daily; patient had been on amiodarone previously from description of symptoms sounds as if she was bradycardic. -Continue Lasix PRN (home regimen) -Obtain orthostatic vitals.  Ambulate patient in A.m. and if BP controlled and tolerating new medication will discharge. -5/2schedule follow-up appointment in 2 weeks with Dr. Crissie Sickles cardiology, chronic diastolic CHF, essential HTN - Essential HTN (uncontrolled) -Furosemide 20 mg daily PRN -Labetalol 10 mg  PRN - PRNLabetalol10mg ,systolic> 626.  Symptomatic PVCs -patient follows withcardiologistDr. Lovena Le. Last visit January 2021, recommended holding Amiodaronefor 3 months,as patient had problems with balance.  CKD stage  IIIa (baseline Cr 1.16) Recent Labs  Lab 02/28/20 1527 02/29/20 0807  CREATININE 1.16* 1.13*    HLD -5/2 LDL= 66 -Controlled.  Patient since that medication will allow her PCP to decide if/when began statin     Discharge Diagnoses:  Active Problems:   Chest pain   Bronchiectasis (HCC)   Chronic diastolic CHF (congestive heart failure) (HCC)   CKD (chronic kidney disease), stage III   Essential hypertension   Discharge Condition: Stable  Diet recommendation: Heart healthy  Filed Weights   02/29/20 0004 03/01/20 0500  Weight: 63.9 kg 63.9 kg    History of present illness:  86 y.o.WF PMHx essential HTN (uncontrolled), TV regurgitation, COPD, CKD stage III   Presented to the ED with complaints of chest pain that started at about 9 AM this morning when she woke up,she reports chest pressure mid to left side of her chest, with tightness radiating to her left arm and jaw.She reports 2 hours later at about 11 AM pain became sharp. Pain is not related to activity.No prior episodes of similar chest pain. Chest pain lastsfew seconds and resolves. Reports several episodes.She was given aspirin and nitro in theED,and chest pain severity has improved. She has chronic unchanged difficulty breathing, and cough.  ED Course:Blood pressure elevated systolic 948N. Troponin unremarkable at 4. EKG with sinus rhythm without significant ST or T wave abnormalities. Portable chest x-ray shows biapical scarring, increased density right lung apex, nonemergent follow-up CT chest recommended. With concern for ACS,recurrence of intermittent pain, hospitalist to admit for chest pain.   Hospital Course:  See above  Procedures: 5/1 PCXR; no acute cardiopulmonary process. -Extensive biapical scarring with suggestion of increasing density at the right lung apex.  5/2 echocardiogram;Left Ventricle: LVEF=60 to 65%.  -Grade I diastolic dysfunction (impaired relaxation).  5/2 CT chest Wo  contrast; .-Widespread moderate cylindrical  and varicoid bronchiectasis in lungs, most prominent in the medial upper lobes and medial right middle lobe. Scattered mucoid impaction and bronchial wall thickening at the areas of bronchiectasis. Findings are compatible with chronic infectious or inflammatory bronchiolitis such as due to atypical mycobacterial infection (MAI). -Extensive patchy subpleural reticulation and irregular bandlike consolidation, most prominent at the lung apices, unchanged,compatible with chronic postinfectious scarring. - New mild right pericardiophrenic lymphadenopathy, nonspecific.Recommend attention on follow-up chest CT with IV contrast in 3 months. -4. Three-vessel coronary atherosclerosis. -5. Small hiatal hernia.      Discharge Exam: Vitals:   02/29/20 2023 02/29/20 2327 03/01/20 0500 03/01/20 0914  BP: (!) 150/89 (!) 144/74 137/60 (!) 143/67  Pulse: 80 64 64 70  Resp:  Temp:  98.3 F (36.8 C) 98.1 F (36.7 C) (!) 97.5 F (36.4 C)  TempSrc:  Oral Oral Oral  SpO2:  94% 95% 95%  Weight:   63.9 kg   Height:        General: A/O x4, no acute respiratory distress Eyes: negative scleral hemorrhage, negative anisocoria, negative icterus ENT: Negative Runny nose, negative gingival bleeding, Neck:  Negative scars, masses, torticollis, lymphadenopathy, JVD Lungs: Clear to auscultation bilaterally without wheezes or crackles Cardiovascular: Regular rate and rhythm without murmur gallop or rub normal S1 and S2   Discharge Instructions   Allergies as of 03/01/2020      Reactions   Latex Itching   Omeprazole Palpitations   Tramadol Itching, Nausea Only   Sulfa Antibiotics Nausea Only, Other (See Comments)   Severe headaches   Zanaflex [tizanidine Hcl] Itching, Other (See Comments)   oversedation      Medication List    STOP taking these medications   amiodarone 200 MG tablet Commonly known as: PACERONE   potassium chloride 20 MEQ  packet Commonly known as: KLOR-CON     TAKE these medications   allopurinol 100 MG tablet Commonly known as: ZYLOPRIM Take 100 mg by mouth at bedtime.   amLODipine 2.5 MG tablet Commonly known as: NORVASC Take 1 tablet (2.5 mg total) by mouth daily. Start taking on: Mar 02, 2020   aspirin EC 81 MG tablet Take 81 mg by mouth every Monday, Wednesday, and Friday.   CALCIUM 600 + D PO Take 1 tablet by mouth daily.   cholecalciferol 1000 units tablet Commonly known as: VITAMIN D Take 1,000 Units by mouth every morning.   diclofenac sodium 1 % Gel Commonly known as: VOLTAREN Apply 2 g topically 2 (two) times daily as needed (pain).   folic acid 400 MCG tablet Commonly known as: FOLVITE Take 400 mcg by mouth daily.   furosemide 20 MG tablet Commonly known as: Lasix Take 1 tablet (20 mg total) by mouth daily.   HYDROcodone-acetaminophen 5-325 MG tablet Commonly known as: NORCO/VICODIN Take 1 tablet by mouth every 6 (six) hours as needed for moderate pain.   Magnesium 250 MG Tabs Take 250 mg by mouth daily.   multivitamin capsule Take 1 capsule by mouth daily.   nitroGLYCERIN 0.4 MG SL tablet Commonly known as: NITROSTAT Place 1 tablet (0.4 mg total) under the tongue every 5 (five) minutes as needed for chest pain.   ondansetron 4 MG tablet Commonly known as: ZOFRAN Take 1 tablet (4 mg total) by mouth every 6 (six) hours as needed for nausea.   polyethylene glycol 17 g packet Commonly known as: MIRALAX / GLYCOLAX Take 17 g by mouth daily as needed for mild constipation.  Theratears PF 0.25 % Soln Generic drug: Carboxymethylcellulose Sod PF Place 1 drop into both eyes 2 (two) times daily as needed (dry eyes).      Allergies  Allergen Reactions  . Latex Itching  . Omeprazole Palpitations  . Tramadol Itching and Nausea Only  . Sulfa Antibiotics Nausea Only and Other (See Comments)    Severe headaches  . Zanaflex [Tizanidine Hcl] Itching and Other (See  Comments)    oversedation   Follow-up Information    Mannam, Praveen, MD. Schedule an appointment as soon as possible for a visit in 2 week(s).   Specialty: Pulmonary Disease Why: Schedule establish care appointment in 1 to 2 weeks with Dr. Isaiah Serge PCCM work-up bronchiectasis/inflammatory bronchiolitis. Contact information: [address] [place] [address] [phone]        Suzan Slick, MD.   Specialty: [facility] Contact information: [address] [place] [address] [phone]        Marinus Maw, MD. Schedule an appointment as soon as possible for a visit in 2 week(s).   Specialty: Cardiology Why: schedule follow-up appointment in 2 weeks with Dr. Ladona Ridgel cardiology, chronic diastolic CHF, essential HTN  Contact information: [address] [place] [address] [phone]            The results of significant diagnostics from this hospitalization (including imaging, microbiology, ancillary and laboratory) are listed below for reference.    Significant Diagnostic Studies: CT CHEST WO CONTRAST  Result Date: 02/29/2020 CLINICAL DATA:  Inpatient. Abnormal chest radiograph. EXAM: CT CHEST WITHOUT CONTRAST TECHNIQUE: Multidetector CT imaging of the chest was performed following the standard protocol without IV contrast. COMPARISON:  Chest radiograph from one day prior. 04/19/2018 chest CT angiogram. FINDINGS: Cardiovascular: Normal heart size. No significant pericardial effusion/thickening. Three-vessel coronary atherosclerosis. Atherosclerotic nonaneurysmal thoracic aorta. Normal caliber pulmonary arteries. Mediastinum/Nodes: No discrete thyroid nodules. Unremarkable esophagus. No axillary adenopathy. Newly mildly enlarged 1.1 cm right pericardiophrenic node (series 3/image 111). No additional pathologically enlarged mediastinal nodes. No discrete hilar adenopathy on these noncontrast images. Lungs/Pleura: No pneumothorax. No pleural effusion.  No acute consolidative airspace disease or lung masses. A few scattered solid pulmonary nodules in both lungs measuring up to 5 mm in the peripheral left lower lobe (series 4/image 99), all stable since 04/19/18 chest CT, considered benign. No new significant pulmonary nodules. Widespread moderate cylindrical and varicoid bronchiectasis throughout both lungs, most prominent in the medial upper lobes and medial right middle lobe. Scattered mucoid impaction and bronchial wall thickening at the areas of bronchiectasis. Extensive patchy subpleural reticulation and irregular bandlike consolidation in both lungs, most prominent in the apical lungs, unchanged and compatible with chronic postinfectious scarring. Upper abdomen: Small hiatal hernia. Musculoskeletal: No aggressive appearing focal osseous lesions. Marked thoracic spondylosis. IMPRESSION: 1. Widespread moderate cylindrical and varicoid bronchiectasis in lungs, most prominent in the medial upper lobes and medial right middle lobe. Scattered mucoid impaction and bronchial wall thickening at the areas of bronchiectasis. Findings are compatible with chronic infectious or inflammatory bronchiolitis such as due to atypical mycobacterial infection (MAI). 2. Extensive patchy subpleural reticulation and irregular bandlike consolidation, most prominent at the lung apices, unchanged, compatible with chronic postinfectious scarring. 3. New mild right pericardiophrenic lymphadenopathy, nonspecific. Recommend attention on follow-up chest CT with IV contrast in 3 months. 4. Three-vessel coronary atherosclerosis. 5. Small hiatal hernia. 6. Aortic Atherosclerosis (ICD10-I70.0). Electronically Signed   By: Delbert Phenix M.D.   On: 02/29/2020 09:48   DG Chest  Portable 1 View  Result Date: 02/28/2020 CLINICAL DATA:  Chest pain. EXAM: PORTABLE CHEST 1 VIEW COMPARISON:  April 18, 2018 FINDINGS: Extensive biapical scarring is noted with suggestion of increasing density at the right  lung apex when compared to prior study. The heart size is stable. Aortic calcifications are noted. There is no pneumothorax. The lungs appear to be slightly hyperexpanded. There is no focal infiltrate. IMPRESSION: 1. No acute cardiopulmonary process. 2. Extensive biapical scarring with suggestion of increasing density at the right lung apex. A follow-up outpatient nonemergent CT of the chest is recommended for further evaluation of this finding. Electronically Signed   By: Katherine Mantlehristopher  Green M.D.   On: 02/28/2020 16:06   ECHOCARDIOGRAM COMPLETE  Result Date: 02/29/2020    ECHOCARDIOGRAM REPORT   Patient Name:   Rhonda Spears Date of Exam: 02/29/2020 Medical Rec #:  [id number]        Height:       61.0 in Accession #:    [id number]       Weight:       140.8 lb Date of Birth:  09/02/1934         BSA:          1.627 m Patient Age:    85 years         BP:           123/53 mmHg Patient Gender: F                HR:           70 bpm. Exam Location:  Inpatient Procedure: 2D Echo Indications:    Chest Pain 786.50 / R07.9  History:        Patient has prior history of Echocardiogram examinations, most                 recent 04/19/2018. COPD, Signs/Symptoms:Chest Pain; Risk                 Factors:Hypertension and Non-Smoker. Idiopathic hypotension,                 Sinus bradycardia , Bigeminy.  Sonographer:    Jeryl ColumbiaJohanna Elliott Referring Phys: [phone] Heloise BeechamEJIROGHENE E EMOKPAE IMPRESSIONS  1. Left ventricular ejection fraction, by estimation, is 60 to 65%. The left ventricle has normal function. The left ventricle has no regional wall motion abnormalities. There is mild concentric left ventricular hypertrophy. Left ventricular diastolic parameters are consistent with Grade I diastolic dysfunction (impaired relaxation).  2. Right ventricular systolic function is normal. The right ventricular size is normal. There is normal pulmonary artery systolic pressure. The estimated right ventricular systolic pressure is 28.6 mmHg.  3. Left atrial  size was mildly dilated.  4. The mitral valve is normal in structure. Mild mitral valve regurgitation. No evidence of mitral stenosis.  5. Tricuspid valve regurgitation is moderate.  6. The aortic valve is normal in structure. Aortic valve regurgitation is not visualized. No aortic stenosis is present.  7. The inferior vena cava is normal in size with greater than 50% respiratory variability, suggesting right atrial pressure of 3 mmHg. FINDINGS  Left Ventricle: Left ventricular ejection fraction, by estimation, is 60 to 65%. The left ventricle has normal function. The left ventricle has no regional wall motion abnormalities. The left ventricular internal cavity size was normal in size. There is  mild concentric left ventricular hypertrophy. Left ventricular diastolic parameters are consistent with Grade I diastolic dysfunction (impaired relaxation). Normal left ventricular filling pressure.  Right Ventricle: The right ventricular size is normal. No increase in right ventricular wall thickness. Right ventricular systolic function is normal. There is normal pulmonary artery systolic pressure. The tricuspid regurgitant velocity is 2.53 m/s, and  with an assumed right atrial pressure of 3 mmHg, the estimated right ventricular systolic pressure is 28.6 mmHg. Left Atrium: Left atrial size was mildly dilated. Right Atrium: Right atrial size was normal in size. Pericardium: There is no evidence of pericardial effusion. Mitral Valve: The mitral valve is normal in structure. Normal mobility of the mitral valve leaflets. Mild mitral valve regurgitation. No evidence of mitral valve stenosis. Tricuspid Valve: The tricuspid valve is normal in structure. Tricuspid valve regurgitation is moderate . No evidence of tricuspid stenosis. Aortic Valve: The aortic valve is normal in structure. Aortic valve regurgitation is not visualized. No aortic stenosis is present. Pulmonic Valve: The pulmonic valve was normal in structure. Pulmonic  valve regurgitation is not visualized. No evidence of pulmonic stenosis. Aorta: The aortic root is normal in size and structure. Venous: The inferior vena cava is normal in size with greater than 50% respiratory variability, suggesting right atrial pressure of 3 mmHg. IAS/Shunts: No atrial level shunt detected by color flow Doppler.  LEFT VENTRICLE PLAX 2D LVIDd:         3.60 cm  Diastology LVIDs:         2.40 cm  LV e' lateral:   7.29 cm/s LV PW:         1.60 cm  LV E/e' lateral: 9.5 LV IVS:        1.20 cm  LV e' medial:    6.53 cm/s LVOT diam:     1.90 cm  LV E/e' medial:  10.6 LVOT Area:     2.84 cm  RIGHT VENTRICLE RV S prime:     13.40 cm/s TAPSE (M-mode): 1.7 cm LEFT ATRIUM             Index       RIGHT ATRIUM          Index LA diam:        3.80 cm 2.34 cm/m  RA Area:     9.19 cm LA Vol (A2C):   46.3 ml 28.46 ml/m RA Volume:   17.40 ml 10.69 ml/m LA Vol (A4C):   63.5 ml 39.03 ml/m LA Biplane Vol: 57.2 ml 35.16 ml/m   AORTA Ao Root diam: 3.20 cm MITRAL VALVE               TRICUSPID VALVE MV Area (PHT): 2.69 cm    TR Peak grad:   25.6 mmHg MV Decel Time: 282 msec    TR Vmax:        253.00 cm/s MV E velocity: 69.40 cm/s MV A velocity: 66.00 cm/s  SHUNTS MV E/A ratio:  1.05        Systemic Diam: 1.90 cm Tobias Alexander MD Electronically signed by Tobias Alexander MD Signature Date/Time: 02/29/2020/12:08:52 PM    Final     Microbiology: Recent Results (from the past 240 hour(s))  Respiratory Panel by RT PCR (Flu A&B, Covid) - Nasopharyngeal Swab     Status: None   Collection Time: 02/28/20  4:34 PM   Specimen: Nasopharyngeal Swab  Result Value Ref Range Status   SARS Coronavirus 2 by RT PCR NEGATIVE NEGATIVE Final    Comment: (NOTE) SARS-CoV-2 target nucleic acids are NOT DETECTED. The SARS-CoV-2 RNA is generally detectable in upper respiratoy specimens during the acute phase  of infection. The lowest concentration of SARS-CoV-2 viral copies this assay can detect is 131 copies/mL. A negative  result does not preclude SARS-Cov-2 infection and should not be used as the sole basis for treatment or other patient management decisions. A negative result may occur with  improper specimen collection/handling, submission of specimen other than nasopharyngeal swab, presence of viral mutation(s) within the areas targeted by this assay, and inadequate number of viral copies (<131 copies/mL). A negative result must be combined with clinical observations, patient history, and epidemiological information. The expected result is Negative. Fact Sheet for Patients:  https://www.moore.com/ Fact Sheet for Healthcare Providers:  https://www.young.biz/ This test is not yet ap proved or cleared by the [place] FDA and  has been authorized for detection and/or diagnosis of SARS-CoV-2 by FDA under an Emergency Use Authorization (EUA). This EUA will remain  in effect (meaning this test can be used) for the duration of the COVID-19 declaration under Section 564(b)(1) of the Act, 21 U.S.C. section 360bbb-3(b)(1), unless the authorization is terminated or revoked sooner.    Influenza A by PCR NEGATIVE NEGATIVE Final   Influenza B by PCR NEGATIVE NEGATIVE Final    Comment: (NOTE) The Xpert Xpress SARS-CoV-2/FLU/RSV assay is intended as an aid in  the diagnosis of influenza from Nasopharyngeal swab specimens and  should not be used as a sole basis for treatment. Nasal washings and  aspirates are unacceptable for Xpert Xpress SARS-CoV-2/FLU/RSV  testing. Fact Sheet for Patients: https://www.moore.com/ Fact Sheet for Healthcare Providers: https://www.young.biz/ This test is not yet approved or cleared by the [place] FDA and  has been authorized for detection and/or diagnosis of SARS-CoV-2 by  FDA under an Emergency Use Authorization (EUA). This EUA will remain  in effect (meaning this test can be used) for the  duration of the  Covid-19 declaration under Section 564(b)(1) of the Act, 21  U.S.C. section 360bbb-3(b)(1), unless the authorization is  terminated or revoked. Performed at [facility], [address], [place], [place] [address]      Labs: Basic Metabolic Panel: Recent Labs  Lab 02/28/20 1527 02/29/20 0807  NA 137 139  K 3.8 4.1  CL 101 104  CO2 26 23  GLUCOSE 85 125*  BUN 20 14  CREATININE 1.16* 1.13*  CALCIUM 9.4 9.1  MG  --  2.2  PHOS  --  3.7   Liver Function Tests: Recent Labs  Lab 02/28/20 1527 02/29/20 0807  AST 25 26  ALT 16 15  ALKPHOS 60 54  BILITOT 0.7 0.9  PROT 7.9 7.3  ALBUMIN 3.8 3.4*   No results for input(s): LIPASE, AMYLASE in the last 168 hours. No results for input(s): AMMONIA in the last 168 hours. CBC: Recent Labs  Lab 02/28/20 1527 02/29/20 0807  WBC 8.7 7.5  NEUTROABS 4.3 3.7  HGB 13.2 12.7  HCT 40.7 38.5  MCV 101.5* 99.5  PLT 218 213   Cardiac Enzymes: No results for input(s): CKTOTAL, CKMB, CKMBINDEX, TROPONINI in the last 168 hours. BNP: BNP (last 3 results) No results for input(s): BNP in the last 8760 hours.  ProBNP (last 3 results) No results for input(s): PROBNP in the last 8760 hours.  CBG: No results for input(s): GLUCAP in the last 168 hours.     Signed:  Carolyne Littles, MD [facility] [phone] pager

## 2020-03-01 NOTE — Care Management Important Message (Signed)
Important Message  Patient Details  Name: Rhonda Spears MRN: [id number] Date of Birth: January 08, 1934   Medicare Important Message Given:  Yes     Renie Ora 03/01/2020, 10:30 AM

## 2020-03-06 LAB — ECHOCARDIOGRAM COMPLETE
Height: 61 in
Weight: 2252.8 oz

## 2020-03-09 NOTE — Progress Notes (Signed)
Cardiology Office Note    Date:  03/16/2020   ID:  Rhonda Spears, DOB 08-May-1934, MRN [id number]  PCP:  Suzan Slick, MD  Cardiologist: No primary care provider on file. EPS: Lewayne Bunting, MD  Chief Complaint  Patient presents with  . Hospitalization Follow-up    History of Present Illness:  Rhonda Spears is a 86 y.o. female with history of PVCs, sinus node dysfunction, CKD stage III treated with amiodarone, chronic diastolic CHF,COPD, probable lung CA has declined treatment and further evaluation.  Last saw Dr. Ladona Ridgel 11/26/2019 and amiodarone was stopped because of balance issues and several falls over the past couple months.  If her balance did not change she was to start amiodarone back.  Discharge from the hospital 03/01/2020 with bronchiectasis started on amlodipine 2.5 mg daily and Lasix as needed 2D echo 02/29/2020 normal LVEF 60 to 65% with grade 1 DD.  Patient comes in by herself for follow up.Says dizzy spells and room spinning improved last week and thinks it's from the amlodipine. She has been taking her own orthostatic BP's at home. About 111/63 lying sitting 146/84 standing 112/74 when she first starting checking them but it's greatly improved over the past several days without a drop in BP. Left leg is now swelling more than right. Hasn't used lasix yet this week-has been using 1-2 times weekly. Has gotten extra salt in her diet eating out.    Past Medical History:  Diagnosis Date  . Chest pain with moderate risk for cardiac etiology 03/30/2017  . CKD (chronic kidney disease), stage III   . COPD (chronic obstructive pulmonary disease) (HCC)   . Gout   . Hypertension   . PVC's (premature ventricular contractions)   . Renal disorder   . Sinus bradycardia   . Tricuspid regurgitation   . Venous insufficiency     Past Surgical History:  Procedure Laterality Date  . ABDOMINAL HYSTERECTOMY    . arthroscopic left knee    . INTRAMEDULLARY (IM) NAIL  INTERTROCHANTERIC Right 06/12/2018   Procedure: INTRAMEDULLARY (IM) NAIL INTERTROCHANTRIC;  Surgeon: Bjorn Pippin, MD;  Location: [facility] OR;  Service: Orthopedics;  Laterality: Right;  . repair left femoral neck    . right medial men      Current Medications: Current Meds  Medication Sig  . allopurinol (ZYLOPRIM) 100 MG tablet Take 100 mg by mouth at bedtime.   Marland Kitchen amLODipine (NORVASC) 2.5 MG tablet Take 1 tablet (2.5 mg total) by mouth daily.  Marland Kitchen aspirin EC 81 MG tablet Take 81 mg by mouth every Monday, Wednesday, and Friday.   . Calcium Carb-Cholecalciferol (CALCIUM 600 + D PO) Take 1 tablet by mouth daily.  . Carboxymethylcellulose Sod PF (THERATEARS PF) 0.25 % SOLN Place 1 drop into both eyes 2 (two) times daily as needed (dry eyes).  . cholecalciferol (VITAMIN D) 1000 units tablet Take 1,000 Units by mouth every morning.   . diclofenac sodium (VOLTAREN) 1 % GEL Apply 2 g topically 2 (two) times daily as needed (pain).   Marland Kitchen docusate sodium (COLACE) 100 MG capsule Take 100 mg by mouth 2 (two) times daily.  . folic acid (FOLVITE) 400 MCG tablet Take 400 mcg by mouth daily.  . furosemide (LASIX) 20 MG tablet Take 1 tablet (20 mg total) by mouth daily.  Marland Kitchen HYDROcodone-acetaminophen (NORCO/VICODIN) 5-325 MG tablet Take 1 tablet by mouth every 6 (six) hours as needed for moderate pain.  . Magnesium 250 MG TABS Take 250 mg by  mouth daily.  . Multiple Vitamin (MULTIVITAMIN) capsule Take 1 capsule by mouth daily.  . nitroGLYCERIN (NITROSTAT) 0.4 MG SL tablet Place 1 tablet (0.4 mg total) under the tongue every 5 (five) minutes as needed for chest pain.     Allergies:   Latex, Omeprazole, Tramadol, Sulfa antibiotics, and Zanaflex [tizanidine hcl]   Social History   Socioeconomic History  . Marital status: Divorced    Spouse name: Not on file  . Number of children: 10  . Years of education: Not on file  . Highest education level: Not on file  Occupational History  . Occupation: Retired  Tobacco  Use  . Smoking status: Never Smoker  . Smokeless tobacco: Never Used  Substance and Sexual Activity  . Alcohol use: No  . Drug use: No  . Sexual activity: Not on file  Other Topics Concern  . Not on file  Social History Narrative   Pt lives alone in [place], [place]   Social Determinants of Health   Financial Resource Strain: Low Risk   . Difficulty of Paying Living Expenses: Not very hard  Food Insecurity: No Food Insecurity  . Worried About Programme researcher, broadcasting/film/video in the Last Year: Never true  . Ran Out of Food in the Last Year: Never true  Transportation Needs: No Transportation Needs  . Lack of Transportation (Medical): No  . Lack of Transportation (Non-Medical): No  Physical Activity: Inactive  . Days of Exercise per Week: 0 days  . Minutes of Exercise per Session: 0 min  Stress: No Stress Concern Present  . Feeling of Stress : Not at all  Social Connections: Slightly Isolated  . Frequency of Communication with Friends and Family: Three times a week  . Frequency of Social Gatherings with Friends and Family: Three times a week  . Attends Religious Services: More than 4 times per year  . Active Member of Clubs or Organizations: Yes  . Attends Banker Meetings: More than 4 times per year  . Marital Status: Divorced     Family History:  The patient's   family history includes Early death (age of onset: 34) in her father; Heart attack in her mother; Hypertension in her mother.   ROS:   Please see the history of present illness.    ROS All other systems reviewed and are negative.   PHYSICAL EXAM:   VS:  BP 122/76   Pulse 78   Ht 5\' 1"  (1.549 m)   Wt 143 lb 12.8 oz (65.2 kg)   SpO2 96%   BMI 27.17 kg/m   Physical Exam  GEN: Well nourished, well developed, in no acute distress  Neck: no JVD, carotid bruits, or masses Cardiac:RRR; no murmurs, rubs, or gallops  Respiratory:  clear to auscultation bilaterally, normal work of breathing GI: soft, nontender,  nondistended, + BS Ext: without cyanosis, clubbing, or edema, Good distal pulses bilaterally Neuro:  Alert and Oriented x 3 Psych: euthymic mood, full affect  Wt Readings from Last 3 Encounters:  03/16/20 143 lb 12.8 oz (65.2 kg)  03/01/20 140 lb 13 oz (63.9 kg)  11/26/19 140 lb (63.5 kg)      Studies/Labs Reviewed:   EKG:  EKG is not ordered today.  The ekg reviewed from 03/01/2020 normal sinus rhythm poor R wave progression anteriorly, no acute change Recent Labs: 02/29/2020: ALT 15; BUN 14; Creatinine, Ser 1.13; Hemoglobin 12.7; Magnesium 2.2; Platelets 213; Potassium 4.1; Sodium 139   Lipid Panel    Component  Value Date/Time   CHOL 144 02/29/2020 0632   TRIG 175 (H) 02/29/2020 0632   HDL 43 02/29/2020 0632   CHOLHDL 3.3 02/29/2020 0632   VLDL 35 02/29/2020 0632   LDLCALC 66 02/29/2020 0632    Additional studies/ records that were reviewed today include:  2D echo 02/29/2020 IMPRESSIONS     1. Left ventricular ejection fraction, by estimation, is 60 to 65%. The  left ventricle has normal function. The left ventricle has no regional  wall motion abnormalities. There is mild concentric left ventricular  hypertrophy. Left ventricular diastolic  parameters are consistent with Grade I diastolic dysfunction (impaired  relaxation).   2. Right ventricular systolic function is normal. The right ventricular  size is normal. There is normal pulmonary artery systolic pressure. The  estimated right ventricular systolic pressure is 28.6 mmHg.   3. Left atrial size was mildly dilated.   4. The mitral valve is normal in structure. Mild mitral valve  regurgitation. No evidence of mitral stenosis.   5. Tricuspid valve regurgitation is moderate.   6. The aortic valve is normal in structure. Aortic valve regurgitation is  not visualized. No aortic stenosis is present.   7. The inferior vena cava is normal in size with greater than 50%  respiratory variability, suggesting right atrial  pressure of 3 mmHg.   FINDINGS   Left Ventricle: Left ventricular ejection fraction, by estimation, is 60  to 65%. The left ventricle has normal function. The left ventricle has no  regional wall motion abnormalities. The left ventricular internal cavity  size was normal in size. There is   mild concentric left ventricular hypertrophy. Left ventricular diastolic  parameters are consistent with Grade I diastolic dysfunction (impaired  relaxation). Normal left ventricular filling pressure.   Right Ventricle: The right ventricular size is normal. No increase in  right ventricular wall thickness. Right ventricular systolic function is  normal. There is normal pulmonary artery systolic pressure. The tricuspid  regurgitant velocity is 2.53 m/s, and   with an assumed right atrial pressure of 3 mmHg, the estimated right  ventricular systolic pressure is 28.6 mmHg.   Left Atrium: Left atrial size was mildly dilated.   Right Atrium: Right atrial size was normal in size.   Pericardium: There is no evidence of pericardial effusion.   Mitral Valve: The mitral valve is normal in structure. Normal mobility of  the mitral valve leaflets. Mild mitral valve regurgitation. No evidence of  mitral valve stenosis.   Tricuspid Valve: The tricuspid valve is normal in structure. Tricuspid  valve regurgitation is moderate . No evidence of tricuspid stenosis.   Aortic Valve: The aortic valve is normal in structure. Aortic valve  regurgitation is not visualized. No aortic stenosis is present.   Pulmonic Valve: The pulmonic valve was normal in structure. Pulmonic valve  regurgitation is not visualized. No evidence of pulmonic stenosis.   Aorta: The aortic root is normal in size and structure.   Venous: The inferior vena cava is normal in size with greater than 50%  respiratory variability, suggesting right atrial pressure of 3 mmHg.   IAS/Shunts: No atrial level shunt detected by color flow Doppler.          ASSESSMENT:    1. Chronic diastolic CHF (congestive heart failure) (HCC)   2. PVC (premature ventricular contraction)   3. Essential hypertension      PLAN:  In order of problems listed above:  Chronic diastolic CHF on as needed Lasix.  2D echo  02/29/2020 normal LVEF 60 to 65% with grade 1 DD-she does have leg edema but did not take Lasix this week because of her appointment today.  She will take Lasix when she gets home.  Symptomatic PVCs previously on amiodarone but stopped because of poor balance and several falls back in January.  Was to restart if balance improved.Follow-up with Dr. Ladona Ridgel in 2 months.  Will not restart amiodarone since she is just now beginning to feel better.  Essential hypertension has had some orthostatic hypotension and has only now begun to feel better over the past several days.  She is checking orthostatic blood pressures on her own at home and they have all stabilized in the past several days.  Continue low-dose amlodipine 2.5 mg once daily.    probable lung CA declined work-up or treatment and recent hospitalization for bronchiectasis.  She does have an appointment in [place] with pulmonary June 2.  We will try to arrange for her to see pulmonary in [place] if possible.    Medication Adjustments/Labs and Tests Ordered: Current medicines are reviewed at length with the patient today.  Concerns regarding medicines are outlined above.  Medication changes, Labs and Tests ordered today are listed in the Patient Instructions below. Patient Instructions   Medication Instructions:  Your physician recommends that you continue on your current medications as directed. Please refer to the Current Medication list given to you today.  *If you need a refill on your cardiac medications before your next appointment, please call your pharmacy*   Lab Work: NONE   If you have labs (blood work) drawn today and your tests are completely normal, you will  receive your results only by: Marland Kitchen MyChart Message (if you have MyChart) OR . A paper copy in the mail If you have any lab test that is abnormal or we need to change your treatment, we will call you to review the results.   Testing/Procedures: NONE   Follow-Up: At [facility], you and your health needs are our priority.  As part of our continuing mission to provide you with exceptional heart care, we have created designated Provider Care Teams.  These Care Teams include your primary Cardiologist (physician) and Advanced Practice Providers (APPs -  Physician Assistants and Nurse Practitioners) who all work together to provide you with the care you need, when you need it.  We recommend signing up for the patient portal called "MyChart".  Sign up information is provided on this After Visit Summary.  MyChart is used to connect with patients for Virtual Visits (Telemedicine).  Patients are able to view lab/test results, encounter notes, upcoming appointments, etc.  Non-urgent messages can be sent to your provider as well.   To learn more about what you can do with MyChart, go to [email or web address].    Your next appointment:   1 month(s)  The format for your next appointment:   In Person  Provider:   Lewayne Bunting, MD   Other Instructions Thank you for choosing [facility]!   Two Gram Sodium Diet 2000 mg  What is Sodium? Sodium is a mineral found naturally in many foods. The most significant source of sodium in the diet is table salt, which is about 40% sodium.  Processed, convenience, and preserved foods also contain a large amount of sodium.  The body needs only 500 mg of sodium daily to function,  A normal diet provides more than enough sodium even if you do not use salt.  Why Limit Sodium?  A build up of sodium in the body can cause thirst, increased blood pressure, shortness of breath, and water retention.  Decreasing sodium in the diet can reduce edema and risk of heart  attack or stroke associated with high blood pressure.  Keep in mind that there are many other factors involved in these health problems.  Heredity, obesity, lack of exercise, cigarette smoking, stress and what you eat all play a role.  General Guidelines:  Do not add salt at the table or in cooking.  One teaspoon of salt contains over 2 grams of sodium.  Read food labels  Avoid processed and convenience foods  Ask your dietitian before eating any foods not dicussed in the menu planning guidelines  Consult your physician if you wish to use a salt substitute or a sodium containing medication such as antacids.  Limit milk and milk products to 16 oz (2 cups) per day.  Shopping Hints:  READ LABELS!! "Dietetic" does not necessarily mean low sodium.  Salt and other sodium ingredients are often added to foods during processing.   Menu Planning Guidelines Food Group Choose More Often Avoid  Beverages (see also the milk group All fruit juices, low-sodium, salt-free vegetables juices, low-sodium carbonated beverages Regular vegetable or tomato juices, commercially softened water used for drinking or cooking  Breads and Cereals Enriched white, wheat, rye and pumpernickel bread, hard rolls and dinner rolls; muffins, cornbread and waffles; most dry cereals, cooked cereal without added salt; unsalted crackers and breadsticks; low sodium or homemade bread crumbs Bread, rolls and crackers with salted tops; quick breads; instant hot cereals; pancakes; commercial bread stuffing; self-rising flower and biscuit mixes; regular bread crumbs or cracker crumbs  Desserts and Sweets Desserts and sweets mad with mild should be within allowance Instant pudding mixes and cake mixes  Fats Butter or margarine; vegetable oils; unsalted salad dressings, regular salad dressings limited to 1 Tbs; light, sour and heavy cream Regular salad dressings containing bacon fat, bacon bits, and salt pork; snack dips made with instant  soup mixes or processed cheese; salted nuts  Fruits Most fresh, frozen and canned fruits Fruits processed with salt or sodium-containing ingredient (some dried fruits are processed with sodium sulfites        Vegetables Fresh, frozen vegetables and low- sodium canned vegetables Regular canned vegetables, sauerkraut, pickled vegetables, and others prepared in brine; frozen vegetables in sauces; vegetables seasoned with ham, bacon or salt pork  Condiments, Sauces, Miscellaneous  Salt substitute with physician's approval; pepper, herbs, spices; vinegar, lemon or lime juice; hot pepper sauce; garlic powder, onion powder, low sodium soy sauce (1 Tbs.); low sodium condiments (ketchup, chili sauce, mustard) in limited amounts (1 tsp.) fresh ground horseradish; unsalted tortilla chips, pretzels, potato chips, popcorn, salsa (1/4 cup) Any seasoning made with salt including garlic salt, celery salt, onion salt, and seasoned salt; sea salt, rock salt, kosher salt; meat tenderizers; monosodium glutamate; mustard, regular soy sauce, barbecue, sauce, chili sauce, teriyaki sauce, steak sauce, Worcestershire sauce, and most flavored vinegars; canned gravy and mixes; regular condiments; salted snack foods, olives, picles, relish, horseradish sauce, catsup   Food preparation: Try these seasonings Meats:    Pork Sage, onion Serve with applesauce  Chicken Poultry seasoning, thyme, parsley Serve with cranberry sauce  Lamb Curry powder, rosemary, garlic, thyme Serve with mint sauce or jelly  Veal Marjoram, basil Serve with current jelly, cranberry sauce  Beef Pepper, bay leaf Serve with dry mustard, unsalted chive butter  Fish [place] leaf, dill Serve with  unsalted lemon butter, unsalted parsley butter  Vegetables:    Asparagus Lemon juice   Broccoli Lemon juice   Carrots Mustard dressing parsley, mint, nutmeg, glazed with unsalted butter and sugar   Green beans Marjoram, lemon juice, nutmeg,dill seed   Tomatoes  Basil, marjoram, onion   Spice /blend for [employer]" 4 tsp ground thyme 1 tsp ground sage 3 tsp ground rosemary 4 tsp ground marjoram   Test your knowledge 1. A product that says "Salt Free" may still contain sodium. True or False 2. Garlic Powder and Hot Pepper Sauce an be used as alternative seasonings.True or False 3. Processed foods have more sodium than fresh foods.  True or False 4. Canned Vegetables have less sodium than froze True or False  WAYS TO DECREASE YOUR SODIUM INTAKE 1. Avoid the use of added salt in cooking and at the table.  Table salt (and other prepared seasonings which contain salt) is probably one of the greatest sources of sodium in the diet.  Unsalted foods can gain flavor from the sweet, sour, and butter taste sensations of herbs and spices.  Instead of using salt for seasoning, try the following seasonings with the foods listed.  Remember: how you use them to enhance natural food flavors is limited only by your creativity... Allspice-Meat, fish, eggs, fruit, peas, red and yellow vegetables Almond Extract-Fruit baked goods Anise Seed-Sweet breads, fruit, carrots, beets, cottage cheese, cookies (tastes like licorice) Basil-Meat, fish, eggs, vegetables, rice, vegetables salads, soups, sauces Bay Leaf-Meat, fish, stews, poultry Burnet-Salad, vegetables (cucumber-like flavor) Caraway Seed-Bread, cookies, cottage cheese, meat, vegetables, cheese, rice Cardamon-Baked goods, fruit, soups Celery Powder or seed-Salads, salad dressings, sauces, meatloaf, soup, bread.Do not use  celery salt Chervil-Meats, salads, fish, eggs, vegetables, cottage cheese (parsley-like flavor) Chili Power-Meatloaf, chicken cheese, corn, eggplant, egg dishes Chives-Salads cottage cheese, egg dishes, soups, vegetables, sauces Cilantro-Salsa, casseroles Cinnamon-Baked goods, fruit, pork, lamb, chicken, carrots Cloves-Fruit, baked goods, fish, pot roast, green beans, beets,  carrots Coriander-Pastry, cookies, meat, salads, cheese (lemon-orange flavor) Cumin-Meatloaf, fish,cheese, eggs, cabbage,fruit pie (caraway flavor) [employer], fruit, eggs, fish, poultry, cottage cheese, vegetables Dill Seed-Meat, cottage cheese, poultry, vegetables, fish, salads, bread Fennel Seed-Bread, cookies, apples, pork, eggs, fish, beets, cabbage, cheese, Licorice-like flavor Garlic-(buds or powder) Salads, meat, poultry, fish, bread, butter, vegetables, potatoes.Do not  use garlic salt Ginger-Fruit, vegetables, baked goods, meat, fish, poultry Horseradish Root-Meet, vegetables, butter Lemon Juice or Extract-Vegetables, fruit, tea, baked goods, fish salads Mace-Baked goods fruit, vegetables, fish, poultry (taste like nutmeg) Maple Extract-Syrups Marjoram-Meat, chicken, fish, vegetables, breads, green salads (taste like Sage) Mint-Tea, lamb, sherbet, vegetables, desserts, carrots, cabbage Mustard, Dry or Seed-Cheese, eggs, meats, vegetables, poultry Nutmeg-Baked goods, fruit, chicken, eggs, vegetables, desserts Onion Powder-Meat, fish, poultry, vegetables, cheese, eggs, bread, rice salads (Do not use   Onion salt) Orange Extract-Desserts, baked goods Oregano-Pasta, eggs, cheese, onions, pork, lamb, fish, chicken, vegetables, green salads Paprika-Meat, fish, poultry, eggs, cheese, vegetables Parsley Flakes-Butter, vegetables, meat fish, poultry, eggs, bread, salads (certain forms may   Contain sodium Pepper-Meat fish, poultry, vegetables, eggs Peppermint Extract-Desserts, baked goods Poppy Seed-Eggs, bread, cheese, fruit dressings, baked goods, noodles, vegetables, cottage  [employer], poultry, meat, fish, cauliflower, turnips,eggs bread Saffron-Rice, bread, veal, chicken, fish, eggs Sage-Meat, fish, poultry, onions, eggplant, tomateos, pork, stews Savory-Eggs, salads, poultry, meat, rice, vegetables, soups, pork Tarragon-Meat,  poultry, fish, eggs, butter, vegetables (licorice-like flavor)  Thyme-Meat, poultry, fish, eggs, vegetables, (clover-like flavor), sauces, soups Tumeric-Salads, butter, eggs, fish, rice, vegetables (saffron-like flavor) Vanilla Extract-Baked goods, candy  Vinegar-Salads, vegetables, meat marinades Walnut Extract-baked goods, candy  2. Choose your Foods Wisely   The following is a list of foods to avoid which are high in sodium:  Meats-Avoid all smoked, canned, salt cured, dried and kosher meat and fish as well as Anchovies   [employer] meats:Bologna, Liverwurst, Pastrami Canned meat or fish  Marinated herring Caviar    Pepperoni Corned Beef   Pizza Dried chipped beef  Salami Frozen breaded fish or meat Salt pork Frankfurters or hot dogs  Sardines Gefilte fish   Sausage Ham (boiled ham, Proscuitto Smoked butt    spiced ham)   Spam      TV Dinners Vegetables Canned vegetables (Regular) Relish Canned mushrooms  Sauerkraut Olives    Tomato juice Pickles  Bakery and Dessert Products Canned puddings  Cream pies Cheesecake   Decorated cakes Cookies  Beverages/Juices Tomato juice, regular  Gatorade   V-8 vegetable juice, regular  Breads and Cereals Biscuit mixes   Salted potato chips, corn chips, pretzels Bread stuffing mixes  Salted crackers and rolls Pancake and waffle mixes Self-rising flour  Seasonings Accent    Meat sauces Barbecue sauce  Meat tenderizer Catsup    Monosodium glutamate (MSG) Celery salt   Onion salt Chili sauce   Prepared mustard Garlic salt   Salt, seasoned salt, sea salt Gravy mixes   Soy sauce Horseradish   Steak sauce Ketchup   Tartar sauce Lite salt    Teriyaki sauce Marinade mixes   Worcestershire sauce  Others Baking powder   Cocoa and cocoa mixes Baking soda   Commercial casserole mixes Candy-caramels, chocolate  Dehydrated soups    Bars, fudge,nougats  Instant rice and pasta mixes Canned broth or soup  Maraschino  cherries Cheese, aged and processed cheese and cheese spreads  Learning Assessment Quiz  Indicated T (for True) or F (for False) for each of the following statements:  1. _____ Fresh fruits and vegetables and unprocessed grains are generally low in sodium 2. _____ Water may contain a considerable amount of sodium, depending on the source 3. _____ You can always tell if a food is high in sodium by tasting it 4. _____ Certain laxatives my be high in sodium and should be avoided unless prescribed   by a physician or pharmacist 5. _____ Salt substitutes may be used freely by anyone on a sodium restricted diet 6. _____ Sodium is present in table salt, food additives and as a natural component of   most foods 7. _____ Table salt is approximately 90% sodium 8. _____ Limiting sodium intake may help prevent excess fluid accumulation in the body 9. _____ On a sodium-restricted diet, seasonings such as bouillon soy sauce, and    cooking wine should be used in place of table salt 10. _____ On an ingredient list, a product which lists monosodium glutamate as the first   ingredient is an appropriate food to include on a low sodium diet  Circle the best answer(s) to the following statements (Hint: there may be more than one correct answer)  11. On a low-sodium diet, some acceptable snack items are:    A. Olives  F. Bean dip   K. Grapefruit juice    B. Salted Pretzels G. Commercial Popcorn   L. Canned peaches    C. Carrot Sticks  H. Bouillon   M. Unsalted nuts   D. [place] fries  I. Peanut butter crackers N. Salami   E. Sweet pickles J. Tomato Juice  O. Pizza  12.  Seasonings that may be used freely on a reduced - sodium diet include   A. Lemon wedges F.Monosodium glutamate K. Celery seed    B.Soysauce   G. Pepper   L. Mustard powder   C. Sea salt  H. Cooking wine  M. Onion flakes   D. Vinegar  E. Prepared horseradish N. Salsa   E. Sage   J. Worcestershire sauce  O. [address]      Elson ClanSigned, Vickie Melnik, PA-C  03/16/2020 12:55 PM    [facility] [address], [place], [place]  [address] Phone: [phone]; Fax: [phone]

## 2020-03-16 ENCOUNTER — Ambulatory Visit (INDEPENDENT_AMBULATORY_CARE_PROVIDER_SITE_OTHER): Payer: Medicare Other | Admitting: Physician Assistant

## 2020-03-16 ENCOUNTER — Encounter: Payer: Self-pay | Admitting: Physician Assistant

## 2020-03-16 ENCOUNTER — Other Ambulatory Visit: Payer: Self-pay

## 2020-03-16 VITALS — BP 122/76 | HR 78 | Ht 61.0 in | Wt 143.8 lb

## 2020-03-16 DIAGNOSIS — I5032 Chronic diastolic (congestive) heart failure: Secondary | ICD-10-CM | POA: Diagnosis not present

## 2020-03-16 DIAGNOSIS — I10 Essential (primary) hypertension: Secondary | ICD-10-CM

## 2020-03-16 DIAGNOSIS — I493 Ventricular premature depolarization: Secondary | ICD-10-CM

## 2020-03-16 NOTE — Patient Instructions (Signed)
Medication Instructions:  Your physician recommends that you continue on your current medications as directed. Please refer to the Current Medication list given to you today.  *If you need a refill on your cardiac medications before your next appointment, please call your pharmacy*   Lab Work: NONE   If you have labs (blood work) drawn today and your tests are completely normal, you will receive your results only by: Marland Kitchen MyChart Message (if you have MyChart) OR . A paper copy in the mail If you have any lab test that is abnormal or we need to change your treatment, we will call you to review the results.   Testing/Procedures: NONE   Follow-Up: At [facility], you and your health needs are our priority.  As part of our continuing mission to provide you with exceptional heart care, we have created designated Provider Care Teams.  These Care Teams include your primary Cardiologist (physician) and Advanced Practice Providers (APPs -  Physician Assistants and Nurse Practitioners) who all work together to provide you with the care you need, when you need it.  We recommend signing up for the patient portal called "MyChart".  Sign up information is provided on this After Visit Summary.  MyChart is used to connect with patients for Virtual Visits (Telemedicine).  Patients are able to view lab/test results, encounter notes, upcoming appointments, etc.  Non-urgent messages can be sent to your provider as well.   To learn more about what you can do with MyChart, go to [email or web address].    Your next appointment:   1 month(s)  The format for your next appointment:   In Person  Provider:   Cristopher Peru, MD   Other Instructions Thank you for choosing [facility]!   Two Gram Sodium Diet 2000 mg  What is Sodium? Sodium is a mineral found naturally in many foods. The most significant source of sodium in the diet is table salt, which is about 40% sodium.  Processed, convenience,  and preserved foods also contain a large amount of sodium.  The body needs only 500 mg of sodium daily to function,  A normal diet provides more than enough sodium even if you do not use salt.  Why Limit Sodium? A build up of sodium in the body can cause thirst, increased blood pressure, shortness of breath, and water retention.  Decreasing sodium in the diet can reduce edema and risk of heart attack or stroke associated with high blood pressure.  Keep in mind that there are many other factors involved in these health problems.  Heredity, obesity, lack of exercise, cigarette smoking, stress and what you eat all play a role.  General Guidelines:  Do not add salt at the table or in cooking.  One teaspoon of salt contains over 2 grams of sodium.  Read food labels  Avoid processed and convenience foods  Ask your dietitian before eating any foods not dicussed in the menu planning guidelines  Consult your physician if you wish to use a salt substitute or a sodium containing medication such as antacids.  Limit milk and milk products to 16 oz (2 cups) per day.  Shopping Hints:  READ LABELS!! "Dietetic" does not necessarily mean low sodium.  Salt and other sodium ingredients are often added to foods during processing.   Menu Planning Guidelines Food Group Choose More Often Avoid  Beverages (see also the milk group All fruit juices, low-sodium, salt-free vegetables juices, low-sodium carbonated beverages Regular vegetable or tomato juices, commercially softened  water used for drinking or cooking  Breads and Cereals Enriched white, wheat, rye and pumpernickel bread, hard rolls and dinner rolls; muffins, cornbread and waffles; most dry cereals, cooked cereal without added salt; unsalted crackers and breadsticks; low sodium or homemade bread crumbs Bread, rolls and crackers with salted tops; quick breads; instant hot cereals; pancakes; commercial bread stuffing; self-rising flower and biscuit mixes;  regular bread crumbs or cracker crumbs  Desserts and Sweets Desserts and sweets mad with mild should be within allowance Instant pudding mixes and cake mixes  Fats Butter or margarine; vegetable oils; unsalted salad dressings, regular salad dressings limited to 1 Tbs; light, sour and heavy cream Regular salad dressings containing bacon fat, bacon bits, and salt pork; snack dips made with instant soup mixes or processed cheese; salted nuts  Fruits Most fresh, frozen and canned fruits Fruits processed with salt or sodium-containing ingredient (some dried fruits are processed with sodium sulfites        Vegetables Fresh, frozen vegetables and low- sodium canned vegetables Regular canned vegetables, sauerkraut, pickled vegetables, and others prepared in brine; frozen vegetables in sauces; vegetables seasoned with ham, bacon or salt pork  Condiments, Sauces, Miscellaneous  Salt substitute with physician's approval; pepper, herbs, spices; vinegar, lemon or lime juice; hot pepper sauce; garlic powder, onion powder, low sodium soy sauce (1 Tbs.); low sodium condiments (ketchup, chili sauce, mustard) in limited amounts (1 tsp.) fresh ground horseradish; unsalted tortilla chips, pretzels, potato chips, popcorn, salsa (1/4 cup) Any seasoning made with salt including garlic salt, celery salt, onion salt, and seasoned salt; sea salt, rock salt, kosher salt; meat tenderizers; monosodium glutamate; mustard, regular soy sauce, barbecue, sauce, chili sauce, teriyaki sauce, steak sauce, Worcestershire sauce, and most flavored vinegars; canned gravy and mixes; regular condiments; salted snack foods, olives, picles, relish, horseradish sauce, catsup   Food preparation: Try these seasonings Meats:    Pork Sage, onion Serve with applesauce  Chicken Poultry seasoning, thyme, parsley Serve with cranberry sauce  Lamb Curry powder, rosemary, garlic, thyme Serve with mint sauce or jelly  Veal Marjoram, basil Serve with  current jelly, cranberry sauce  Beef Pepper, bay leaf Serve with dry mustard, unsalted chive butter  Fish [place] leaf, dill Serve with unsalted lemon butter, unsalted parsley butter  Vegetables:    Asparagus Lemon juice   Broccoli Lemon juice   Carrots Mustard dressing parsley, mint, nutmeg, glazed with unsalted butter and sugar   Green beans Marjoram, lemon juice, nutmeg,dill seed   Tomatoes Basil, marjoram, onion   Spice /blend for [employer]" 4 tsp ground thyme 1 tsp ground sage 3 tsp ground rosemary 4 tsp ground marjoram   Test your knowledge 1. A product that says "Salt Free" may still contain sodium. True or False 2. Garlic Powder and Hot Pepper Sauce an be used as alternative seasonings.True or False 3. Processed foods have more sodium than fresh foods.  True or False 4. Canned Vegetables have less sodium than froze True or False  WAYS TO DECREASE YOUR SODIUM INTAKE 1. Avoid the use of added salt in cooking and at the table.  Table salt (and other prepared seasonings which contain salt) is probably one of the greatest sources of sodium in the diet.  Unsalted foods can gain flavor from the sweet, sour, and butter taste sensations of herbs and spices.  Instead of using salt for seasoning, try the following seasonings with the foods listed.  Remember: how you use them to enhance natural food flavors is limited  only by your creativity... Allspice-Meat, fish, eggs, fruit, peas, red and yellow vegetables Almond Extract-Fruit baked goods Anise Seed-Sweet breads, fruit, carrots, beets, cottage cheese, cookies (tastes like licorice) Basil-Meat, fish, eggs, vegetables, rice, vegetables salads, soups, sauces Bay Leaf-Meat, fish, stews, poultry Burnet-Salad, vegetables (cucumber-like flavor) Caraway Seed-Bread, cookies, cottage cheese, meat, vegetables, cheese, rice Cardamon-Baked goods, fruit, soups Celery Powder or seed-Salads, salad dressings, sauces, meatloaf, soup, bread.Do not use   celery salt Chervil-Meats, salads, fish, eggs, vegetables, cottage cheese (parsley-like flavor) Chili Power-Meatloaf, chicken cheese, corn, eggplant, egg dishes Chives-Salads cottage cheese, egg dishes, soups, vegetables, sauces Cilantro-Salsa, casseroles Cinnamon-Baked goods, fruit, pork, lamb, chicken, carrots Cloves-Fruit, baked goods, fish, pot roast, green beans, beets, carrots Coriander-Pastry, cookies, meat, salads, cheese (lemon-orange flavor) Cumin-Meatloaf, fish,cheese, eggs, cabbage,fruit pie (caraway flavor) [employer], fruit, eggs, fish, poultry, cottage cheese, vegetables Dill Seed-Meat, cottage cheese, poultry, vegetables, fish, salads, bread Fennel Seed-Bread, cookies, apples, pork, eggs, fish, beets, cabbage, cheese, Licorice-like flavor Garlic-(buds or powder) Salads, meat, poultry, fish, bread, butter, vegetables, potatoes.Do not  use garlic salt Ginger-Fruit, vegetables, baked goods, meat, fish, poultry Horseradish Root-Meet, vegetables, butter Lemon Juice or Extract-Vegetables, fruit, tea, baked goods, fish salads Mace-Baked goods fruit, vegetables, fish, poultry (taste like nutmeg) Maple Extract-Syrups Marjoram-Meat, chicken, fish, vegetables, breads, green salads (taste like Sage) Mint-Tea, lamb, sherbet, vegetables, desserts, carrots, cabbage Mustard, Dry or Seed-Cheese, eggs, meats, vegetables, poultry Nutmeg-Baked goods, fruit, chicken, eggs, vegetables, desserts Onion Powder-Meat, fish, poultry, vegetables, cheese, eggs, bread, rice salads (Do not use   Onion salt) Orange Extract-Desserts, baked goods Oregano-Pasta, eggs, cheese, onions, pork, lamb, fish, chicken, vegetables, green salads Paprika-Meat, fish, poultry, eggs, cheese, vegetables Parsley Flakes-Butter, vegetables, meat fish, poultry, eggs, bread, salads (certain forms may   Contain sodium Pepper-Meat fish, poultry, vegetables, eggs Peppermint Extract-Desserts, baked goods Poppy Seed-Eggs,  bread, cheese, fruit dressings, baked goods, noodles, vegetables, cottage  [employer], poultry, meat, fish, cauliflower, turnips,eggs bread Saffron-Rice, bread, veal, chicken, fish, eggs Sage-Meat, fish, poultry, onions, eggplant, tomateos, pork, stews Savory-Eggs, salads, poultry, meat, rice, vegetables, soups, pork Tarragon-Meat, poultry, fish, eggs, butter, vegetables (licorice-like flavor)  Thyme-Meat, poultry, fish, eggs, vegetables, (clover-like flavor), sauces, soups Tumeric-Salads, butter, eggs, fish, rice, vegetables (saffron-like flavor) Vanilla Extract-Baked goods, candy Vinegar-Salads, vegetables, meat marinades Walnut Extract-baked goods, candy  2. Choose your Foods Wisely   The following is a list of foods to avoid which are high in sodium:  Meats-Avoid all smoked, canned, salt cured, dried and kosher meat and fish as well as Anchovies   [employer] meats:Bologna, Liverwurst, Pastrami Canned meat or fish  Marinated herring Caviar    Pepperoni Corned Beef   Pizza Dried chipped beef  Salami Frozen breaded fish or meat Salt pork Frankfurters or hot dogs  Sardines Gefilte fish   Sausage Ham (boiled ham, Proscuitto Smoked butt    spiced ham)   Spam      TV Dinners Vegetables Canned vegetables (Regular) Relish Canned mushrooms  Sauerkraut Olives    Tomato juice Pickles  Bakery and Dessert Products Canned puddings  Cream pies Cheesecake   Decorated cakes Cookies  Beverages/Juices Tomato juice, regular  Gatorade   V-8 vegetable juice, regular  Breads and Cereals Biscuit mixes   Salted potato chips, corn chips, pretzels Bread stuffing mixes  Salted crackers and rolls Pancake and waffle mixes Self-rising flour  Seasonings Accent    Meat sauces Barbecue sauce  Meat tenderizer Catsup    Monosodium glutamate (MSG) Celery salt   Onion salt  Chili sauce   Prepared mustard Garlic salt   Salt, seasoned salt, sea  salt Gravy mixes   Soy sauce Horseradish   Steak sauce Ketchup   Tartar sauce Lite salt    Teriyaki sauce Marinade mixes   Worcestershire sauce  Others Baking powder   Cocoa and cocoa mixes Baking soda   Commercial casserole mixes Candy-caramels, chocolate  Dehydrated soups    Bars, fudge,nougats  Instant rice and pasta mixes Canned broth or soup  Maraschino cherries Cheese, aged and processed cheese and cheese spreads  Learning Assessment Quiz  Indicated T (for True) or F (for False) for each of the following statements:  1. _____ Fresh fruits and vegetables and unprocessed grains are generally low in sodium 2. _____ Water may contain a considerable amount of sodium, depending on the source 3. _____ You can always tell if a food is high in sodium by tasting it 4. _____ Certain laxatives my be high in sodium and should be avoided unless prescribed   by a physician or pharmacist 5. _____ Salt substitutes may be used freely by anyone on a sodium restricted diet 6. _____ Sodium is present in table salt, food additives and as a natural component of   most foods 7. _____ Table salt is approximately 90% sodium 8. _____ Limiting sodium intake may help prevent excess fluid accumulation in the body 9. _____ On a sodium-restricted diet, seasonings such as bouillon soy sauce, and    cooking wine should be used in place of table salt 10. _____ On an ingredient list, a product which lists monosodium glutamate as the first   ingredient is an appropriate food to include on a low sodium diet  Circle the best answer(s) to the following statements (Hint: there may be more than one correct answer)  11. On a low-sodium diet, some acceptable snack items are:    A. Olives  F. Bean dip   K. Grapefruit juice    B. Salted Pretzels G. Commercial Popcorn   L. Canned peaches    C. Carrot Sticks  H. Bouillon   M. Unsalted nuts   D. [place] fries  I. Peanut butter crackers N. Salami   E. Sweet pickles J.  Tomato Juice   O. Pizza  12.  Seasonings that may be used freely on a reduced - sodium diet include   A. Lemon wedges F.Monosodium glutamate K. Celery seed    B.Soysauce   G. Pepper   L. Mustard powder   C. Sea salt  H. Cooking wine  M. Onion flakes   D. Vinegar  E. Prepared horseradish N. Salsa   E. Sage   J. Worcestershire sauce  O. Chutney

## 2020-03-18 ENCOUNTER — Other Ambulatory Visit: Payer: Self-pay

## 2020-03-18 ENCOUNTER — Encounter: Payer: Self-pay | Admitting: Internal Medicine

## 2020-03-18 ENCOUNTER — Ambulatory Visit (INDEPENDENT_AMBULATORY_CARE_PROVIDER_SITE_OTHER): Payer: Medicare Other | Admitting: Internal Medicine

## 2020-03-18 DIAGNOSIS — R0600 Dyspnea, unspecified: Secondary | ICD-10-CM

## 2020-03-18 DIAGNOSIS — J479 Bronchiectasis, uncomplicated: Secondary | ICD-10-CM | POA: Diagnosis not present

## 2020-03-18 DIAGNOSIS — R0609 Other forms of dyspnea: Secondary | ICD-10-CM

## 2020-03-18 MED ORDER — FAMOTIDINE 20 MG PO TABS: ORAL_TABLET | ORAL | 2 refills | Status: DC

## 2020-03-18 NOTE — Patient Instructions (Addendum)
Pepcid 20 mg after breakfast and supper until return   GERD (REFLUX)  is an extremely common cause of respiratory symptoms just like yours , many times with no obvious heartburn at all.    It can be treated with medication, but also with lifestyle changes including elevation of the head of your bed (ideally with 6 -8inch blocks under the headboard of your bed),  Smoking cessation, avoidance of late meals, excessive alcohol, and avoid fatty foods, chocolate, peppermint, colas, red wine, and acidic juices such as orange juice.  NO MINT OR MENTHOL PRODUCTS SO NO COUGH DROPS  USE SUGARLESS CANDY INSTEAD (Jolley ranchers or Stover's or Life Savers) or even ice chips will also do - the key is to swallow to prevent all throat clearing. NO OIL BASED VITAMINS - use powdered substitutes.  Avoid fish oil when coughing.  Keep walking as much as you can especially when you can lean on the shopping cart    Please schedule a follow up office visit in 4 weeks, sooner if needed with pfts prior to visit   Add:  needs alpha one/ Ig's/ quant gold tb to complete the w/u

## 2020-03-18 NOTE — Progress Notes (Signed)
Rhonda Spears, female    DOB: 12-06-1933,     MRN: [id number]   Brief patient profile:  27 yowf minimal smoking hx f/b [place] with dx of bronchiectasis and progressively worse doe / cough onset ?  around 2015 > admit [facility]  Admit date: 02/28/2020 Discharge date: 03/01/2020  Time spent: 30 minutes  Recommendations for Outpatient Follow-up:Chest pain -heart score 4, chest pain improved with nitroglycerin,recurrent in ED.No significant history of coronary artery disease, nuclear stress test 2019 low risk study. EKG, troponin so far unremarkable. -Trend troponin Results for CADE, OLBERDING (MRN [id number]) as of 03/01/2020 09:24  Ref. Range 02/28/2020 15:27 02/28/2020 17:25 02/28/2020 20:45  Troponin I (High Sensitivity) Latest Ref Range: <18 ng/L 4 4 5   -EKG;first-degree AV block. -Aspirin 324mg  given,continue 81 mg daily.  COPD -Stable. -Xopenex nebulizedPRN  Bronchiectasis -PCXRshowing biapical scarring, increased density right lung apex,see results below. -5/2CT chest;consistent with bronchiectasis. No features to indicate lung cancer. -Schedule establish care appointment in 1 to 2 weeks with Dr.PraveenMannamPCCM work-up bronchiectasis/inflammatory bronchiolitis.   ChronicDiastolic CHF -5/2 echocardiogram; diastolic CHF see results below -Strict in and out -Daily weight -Amlodipine 2.5 mg daily; patient had been on amiodarone previously from description of symptoms sounds as if she was bradycardic. -Continue LasixPRN(home regimen) -Obtain orthostatic vitals. Ambulate patient in A.m. and if BP controlled and tolerating new medication will discharge. -5/2schedule follow-up appointment in 2 weeks with Dr. Crissie Sickles cardiology, chronic diastolic CHF, essential HTN - Essential HTN (uncontrolled) -Furosemide 20 mg dailyPRN -Labetalol 10 mgPRN - PRNLabetalol10mg ,systolic> 174.  Symptomatic PVCs -patient follows withcardiologistDr. Lovena Le. Last visit  January 2021, recommended holding Amiodaronefor 3 months,as patient had problems with balance.  CKD stage IIIa (baselineCr1.16) Last Labs       Recent Labs  Lab 02/28/20 1527 02/29/20 0807  CREATININE 1.16* 1.13*      HLD -5/2LDL=66 -Controlled. Patient since that medication will allow her PCP to decide if/when began statin     Discharge Diagnoses:  Active Problems:   Chest pain   Bronchiectasis (HCC)   Chronic diastolic CHF (congestive heart failure) (HCC)   CKD (chronic kidney disease), stage III   Essential hypertension   Discharge Condition: Stable  Diet recommendation: Heart healthy      Filed Weights   02/29/20 0004 03/01/20 0500  Weight: 63.9 kg 63.9 kg    History of present illness:  86 y.o.WF PMHxessential HTN (uncontrolled), TV regurgitation, COPD, CKD stage III  Presented to the ED with complaints of chest pain that started at about 9 AM this morning when she woke up,she reports chest pressure mid to left side of her chest, with tightness radiating to her left arm and jaw.She reports 2 hours later at about 11 AM pain became sharp. Pain is not related to activity.No prior episodes of similar chest pain. Chest pain lastsfew seconds and resolves. Reports several episodes.She was given aspirin and nitro in theED,and chest pain severity has improved. She has chronic unchanged difficulty breathing, and cough.  ED Course:Blood pressure elevated systolic 081K.Troponin unremarkable at 4. EKG with sinus rhythm without significant ST or T wave abnormalities. Portable chest x-ray shows biapical scarring, increased density right lung apex, nonemergent follow-up CT chest recommended. With concern for ACS,recurrence of intermittent pain, hospitalist to admit for chest pain.      Procedures: 5/1 PCXR; no acute cardiopulmonary process. -Extensive biapical scarring with suggestion of increasing density at the right lung  apex. 5/2 echocardiogram;Left Ventricle: LVEF=60 to 65%. -Grade I diastolic dysfunction (impaired  relaxation). 5/2 CT chest Wo contrast;.-Widespread moderate cylindrical and varicoid bronchiectasis in lungs, most prominent in the medial upper lobes and medial right middle lobe. Scattered mucoid impaction and bronchial wall thickening at the areas of bronchiectasis. Findings are compatible with chronic infectious or inflammatory bronchiolitis such as due to atypical mycobacterial infection (MAI). -Extensive patchy subpleural reticulation and irregular bandlike consolidation, most prominent at the lung apices, unchanged,compatible with chronic postinfectious scarring. -New mild right pericardiophrenic lymphadenopathy, nonspecific.Recommend attention on follow-up chest CT with IV contrast in 3 months. -4. Three-vessel coronary atherosclerosis. -5. Small hiatal hernia.       History of Present Illness  03/18/2020  Pulmonary/ 1st office eval/Victormanuel Mclure :  Cough/sob post hosp/ not back to baseline  Chief Complaint  Patient presents with  . Hospitalization Follow-up    SOB with walking approx 30 ft. Former Dr Juanetta Gosling pt. She has occ cough with white sputum.  Dyspnea:  MMRC2 = can't walk a nl pace on a flat grade s sob but does fine slow and flat/ can still do [employer] s stopping due to sob but has to lean on cart / now sob x30  ft slow p admit Cough: variable mostly dry, always at hs and has lump in throat and urge to clear continuous x onset of sob  Sleep: fine once asleep SABA use: not helpful, makes heart race  No more cp  No obvious day to day or daytime variability or assoc excess/ purulent sputum or mucus plugs or hemoptysis or   chest tightness, subjective wheeze or overt sinus or hb symptoms.   Sleeping flat without nocturnal  or early am exacerbation  of respiratory  c/o's or need for noct saba. Also denies any obvious fluctuation of symptoms with weather or environmental changes or  other aggravating or alleviating factors except as outlined above   No unusual exposure hx or h/o childhood pna/ asthma or knowledge of premature birth.  Current Allergies, Complete Past Medical History, Past Surgical History, Family History, and Social History were reviewed in [employer] record.  ROS  The following are not active complaints unless bolded Hoarseness, sore throat, dysphagia, dental problems, itching, sneezing,  nasal congestion or discharge of excess mucus or purulent secretions, ear ache,   fever, chills, sweats, unintended wt loss or wt gain, classically pleuritic or exertional cp,  orthopnea pnd or arm/hand swelling  or leg swelling worse on amlodipine, presyncope, palpitations, abdominal pain, anorexia, nausea, vomiting, diarrhea  or change in bowel habits or change in bladder habits, change in stools or change in urine, dysuria, hematuria,  rash, arthralgias, visual complaints, headache, numbness, weakness or ataxia or problems with walking or coordination,  change in mood or  memory.           Past Medical History:  Diagnosis Date  . Chest pain with moderate risk for cardiac etiology 03/30/2017  . CKD (chronic kidney disease), stage III   . COPD (chronic obstructive pulmonary disease) (HCC)   . Gout   . Hypertension   . PVC's (premature ventricular contractions)   . Renal disorder   . Sinus bradycardia   . Tricuspid regurgitation   . Venous insufficiency     Outpatient Medications Prior to Visit - - NOTE:   Unable to verify as accurately reflecting what pt takes     Medication Sig Dispense Refill  . allopurinol (ZYLOPRIM) 100 MG tablet Take 100 mg by mouth at bedtime.     Marland Kitchen amLODipine (NORVASC) 2.5 MG tablet  Take 1 tablet (2.5 mg total) by mouth daily. 30 tablet 0  . aspirin EC 81 MG tablet Take 81 mg by mouth every Monday, Wednesday, and Friday.     . Calcium Carb-Cholecalciferol (CALCIUM 600 + D PO) Take 1 tablet by mouth daily.    .  Carboxymethylcellulose Sod PF (THERATEARS PF) 0.25 % SOLN Place 1 drop into both eyes 2 (two) times daily as needed (dry eyes).    . cholecalciferol (VITAMIN D) 1000 units tablet Take 1,000 Units by mouth every morning.     . diclofenac sodium (VOLTAREN) 1 % GEL Apply 2 g topically 2 (two) times daily as needed (pain).     Marland Kitchen docusate sodium (COLACE) 100 MG capsule Take 100 mg by mouth 2 (two) times daily.    . folic acid (FOLVITE) 400 MCG tablet Take 400 mcg by mouth daily.    . furosemide (LASIX) 20 MG tablet Take 1 tablet (20 mg total) by mouth daily. 90 tablet 3  . HYDROcodone-acetaminophen (NORCO/VICODIN) 5-325 MG tablet Take 1 tablet by mouth every 6 (six) hours as needed for moderate pain.    . Magnesium 250 MG TABS Take 250 mg by mouth daily.    . Multiple Vitamin (MULTIVITAMIN) capsule Take 1 capsule by mouth daily.    . nitroGLYCERIN (NITROSTAT) 0.4 MG SL tablet Place 1 tablet (0.4 mg total) under the tongue every 5 (five) minutes as needed for chest pain. 30 tablet 0      Objective:     BP 140/90 (BP Location: Left Arm, Cuff Size: Normal)   Pulse 64   Temp 97.6 F (36.4 C) (Temporal)   Ht 5' 1.5" (1.562 m)   Wt 143 lb (64.9 kg)   SpO2 96% Comment: on RA  BMI 26.58 kg/m     Mod kyphosis distant bs 1+ pitting   SpO2: 96 %(on RA)   Pleasant amb wf nad/ cough is dry on voluntary maneuvers   HEENT : pt wearing mask not removed for exam due to covid -19 concerns.    NECK :  without JVD/Nodes/TM/ nl carotid upstrokes bilaterally   LUNGS: no acc muscle use, Mod kyphotic contour chest which is clear to A and P bilaterally without cough on insp or exp maneuvers   CV:  RRR  no s3 or murmur or increase in P2, and 1+ pitting both LE's  ABD:  soft and nontender with nl inspiratory excursion in the supine position. No bruits or organomegaly appreciated, bowel sounds nl  MS:  Nl gait/ ext warm without deformities, calf tenderness, cyanosis or clubbing No obvious joint  restrictions   SKIN: warm and dry without lesions    NEURO:  alert, approp, nl sensorium with  no motor or cerebellar deficits apparent.       Assessment   Bronchiectasis (HCC) Onset of symptoms around 2015  - 5/2 CT chest Wo contrast;.-Widespread moderate cylindrical and varicoid bronchiectasis in lungs, most prominent in the medial upper lobes and medial right middle lobe. Scattered mucoid impaction and bronchial wall thickening at the areas of bronchiectasis. Findings are compatible with chronic infectious or inflammatory bronchiolitis such as due to atypical mycobacterial infection (MAI). -Extensive patchy subpleural reticulation and irregular bandlike consolidation, most prominent at the lung apices, unchanged,compatible with chronic postinfectious scarring. -New mild right pericardiophrenic lymphadenopathy, nonspecific.Recommend attention on follow-up chest CT with IV contrast in 3 months. -4. Three-vessel coronary atherosclerosis. -5. Small hiatal hernia.   PFT's previously schedules but not done and her cough is  not at all typical of bronchiectasis but more likely Upper airway cough syndrome (previously labeled PNDS),  is so named because it's frequently impossible to sort out how much is  CR/sinusitis with freq throat clearing (which can be related to primary GERD)   vs  causing  secondary (" extra esophageal")  GERD from wide swings in gastric pressure that occur with throat clearing, often  promoting self use of mint and menthol lozenges that reduce the lower esophageal sphincter tone and exacerbate the problem further in a cyclical fashion.   These are the same pts (now being labeled as having "irritable larynx syndrome" by some cough centers) who not infrequently have a history of having failed to tolerate ace inhibitors,  dry powder inhalers or biphosphonates or report having atypical/extraesophageal reflux symptoms that don't respond to standard doses of PPI  and are easily  confused as having aecopd or asthma flares by even experienced allergists/ pulmonologists (myself included).    >>> rec pepcid bid as she is convinced had palpitations from omeprazole and emphasized diet and life style changes for now   Return with pfts and needs alpha one/ Ig's/ quant gold tb to complete the w/u         DOE (dyspnea on exertion) Onset around 2015 with bronchiectasis and no better p saba - Echo 02/29/20  1. Left ventricular ejection fraction, by estimation, is 60 to 65%. The  left ventricle has normal function. The left ventricle has no regional  wall motion abnormalities. There is mild concentric left ventricular  hypertrophy. Left ventricular diastolic parameters are consistent with Grade I diastolic dysfunction (impaired  relaxation).  2. Right ventricular systolic function is normal. The right ventricular  size is normal. There is normal pulmonary artery systolic pressure. The  estimated right ventricular systolic pressure is 28.6 mmHg.  3. Left atrial size was mildly dilated.  4. The mitral valve is normal in structure. Mild mitral valve  regurgitation. No evidence of mitral stenosis.  5. Tricuspid valve regurgitation is moderate.  6. The aortic valve is normal in structure. Aortic valve regurgitation is  not visualized. No aortic stenosis is present.  7. The inferior vena cava is normal in size with greater than 50%  respiratory variability, suggesting right atrial pressure of 3 mmHg. -  03/18/2020   Walked RA  24700ft each @ slow  pace stopped due to end of study sob with sats  98%  Symptoms are   disproportionate to objective findings and not clear to what extent this is actually a pulmonary  problem but pt does appear to have difficult to sort out respiratory symptoms of unknown origin for which  DDX  = almost all start with A and  include Adherence, Ace Inhibitors, Acid Reflux, Active Sinus Disease, Alpha 1 Antitripsin deficiency, Anxiety masquerading as  Airways dz,  ABPA,  Allergy(esp in young), Aspiration (esp in elderly), Adverse effects of meds,  Active smoking or Vaping, A bunch of PE's/clot burden (a few small clots can't cause this syndrome unless there is already severe underlying pulm or vascular dz with poor reserve),  Anemia or thyroid disorder, plus two Bs  = Bronchiectasis and Beta blocker use..and one C= CHF   Adherence is always the initial "prime suspect" and is a multilayered concern that requires a "trust but verify" approach in every patient - starting with knowing how to use medications, especially inhalers, correctly, keeping up with refills and understanding the fundamental difference between maintenance and prns vs those medications only  taken for a very short course and then stopped and not refilled.  - rec return with all meds in hand using a trust but verify approach to confirm accurate Medication  Reconciliation The principal here is that until we are certain that the  patients are doing what we've asked, it makes no sense to ask them to do more.   ?allergy/asthma/copd  >  doubt since no response so needs pfts on return  ? Acid (or non-acid) GERD > always difficult to exclude as up to 75% of pts in some series report no assoc GI/ Heartburn symptoms> rec max (24h)  acid suppression and diet restrictions/ reviewed and instructions given in writing.   ? Alpha one def > need to check next ov   ? Aspiration > nothing to suggest by hx   ? Adverse drug effects > no longer on Amiodarone and says feels better off it so hard to tell - in setting of known lung dz would avoid if possible  Bronchiectasis > see sep a/p  Beta blockers probably ok here if needed but would avoid high doses and opt for bisoprolol instead  CHF > clearly has element of diastolic dysfunction with edema on exam > defer rx to cards        Each maintenance medication was reviewed in detail including emphasizing most importantly the difference between  maintenance and prns and under what circumstances the prns are to be triggered using an action plan format where appropriate.  Total time for H and P, chart review, counseling,  directly observing portions of ambulatory 02 saturation study/  and generating customized AVS unique to this new pt office visit / charting = 60 min           Sandrea Hughs, MD 03/18/2020

## 2020-03-18 NOTE — Assessment & Plan Note (Addendum)
Onset of symptoms around 2015  - 5/2 CT chest Wo contrast;.-Widespread moderate cylindrical and varicoid bronchiectasis in lungs, most prominent in the medial upper lobes and medial right middle lobe. Scattered mucoid impaction and bronchial wall thickening at the areas of bronchiectasis. Findings are compatible with chronic infectious or inflammatory bronchiolitis such as due to atypical mycobacterial infection (MAI). -Extensive patchy subpleural reticulation and irregular bandlike consolidation, most prominent at the lung apices, unchanged,compatible with chronic postinfectious scarring. -New mild right pericardiophrenic lymphadenopathy, nonspecific.Recommend attention on follow-up chest CT with IV contrast in 3 months. -4. Three-vessel coronary atherosclerosis. -5. Small hiatal hernia.   PFT's previously schedules but not done and her cough is not at all typical of bronchiectasis but more likely Upper airway cough syndrome (previously labeled PNDS),  is so named because it's frequently impossible to sort out how much is  CR/sinusitis with freq throat clearing (which can be related to primary GERD)   vs  causing  secondary (" extra esophageal")  GERD from wide swings in gastric pressure that occur with throat clearing, often  promoting self use of mint and menthol lozenges that reduce the lower esophageal sphincter tone and exacerbate the problem further in a cyclical fashion.   These are the same pts (now being labeled as having "irritable larynx syndrome" by some cough centers) who not infrequently have a history of having failed to tolerate ace inhibitors,  dry powder inhalers or biphosphonates or report having atypical/extraesophageal reflux symptoms that don't respond to standard doses of PPI  and are easily confused as having aecopd or asthma flares by even experienced allergists/ pulmonologists (myself included).    >>> rec pepcid bid as she is convinced had palpitations from omeprazole and  emphasized diet and life style changes for now   Return with pfts and needs alpha one/ Ig's/ quant gold tb to complete the w/u           Each maintenance medication was reviewed in detail including emphasizing most importantly the difference between maintenance and prns and under what circumstances the prns are to be triggered using an action plan format where appropriate.  Total time for H and P, chart review, counseling,  directly observing portions of ambulatory 02 saturation study/  and generating customized AVS unique to this new pt office visit / charting = 60 min

## 2020-03-18 NOTE — Assessment & Plan Note (Signed)
Onset around 2015 with bronchiectasis and no better p saba - Echo 02/29/20  1. Left ventricular ejection fraction, by estimation, is 60 to 65%. The  left ventricle has normal function. The left ventricle has no regional  wall motion abnormalities. There is mild concentric left ventricular  hypertrophy. Left ventricular diastolic parameters are consistent with Grade I diastolic dysfunction (impaired  relaxation).  2. Right ventricular systolic function is normal. The right ventricular  size is normal. There is normal pulmonary artery systolic pressure. The  estimated right ventricular systolic pressure is 28.6 mmHg.  3. Left atrial size was mildly dilated.  4. The mitral valve is normal in structure. Mild mitral valve  regurgitation. No evidence of mitral stenosis.  5. Tricuspid valve regurgitation is moderate.  6. The aortic valve is normal in structure. Aortic valve regurgitation is  not visualized. No aortic stenosis is present.  7. The inferior vena cava is normal in size with greater than 50%  respiratory variability, suggesting right atrial pressure of 3 mmHg. -  03/18/2020   Walked RA  262ft each @ slow  pace stopped due to end of study sob with sats  98%  Symptoms are   disproportionate to objective findings and not clear to what extent this is actually a pulmonary  problem but pt does appear to have difficult to sort out respiratory symptoms of unknown origin for which  DDX  = almost all start with A and  include Adherence, Ace Inhibitors, Acid Reflux, Active Sinus Disease, Alpha 1 Antitripsin deficiency, Anxiety masquerading as Airways dz,  ABPA,  Allergy(esp in young), Aspiration (esp in elderly), Adverse effects of meds,  Active smoking or Vaping, A bunch of PE's/clot burden (a few small clots can't cause this syndrome unless there is already severe underlying pulm or vascular dz with poor reserve),  Anemia or thyroid disorder, plus two Bs  = Bronchiectasis and Beta blocker use..and  one C= CHF   Adherence is always the initial "prime suspect" and is a multilayered concern that requires a "trust but verify" approach in every patient - starting with knowing how to use medications, especially inhalers, correctly, keeping up with refills and understanding the fundamental difference between maintenance and prns vs those medications only taken for a very short course and then stopped and not refilled.  - rec return with all meds in hand using a trust but verify approach to confirm accurate Medication  Reconciliation The principal here is that until we are certain that the  patients are doing what we've asked, it makes no sense to ask them to do more.   ?allergy/asthma/copd  >  doubt since no response so needs pfts on return  ? Acid (or non-acid) GERD > always difficult to exclude as up to 75% of pts in some series report no assoc GI/ Heartburn symptoms> rec max (24h)  acid suppression and diet restrictions/ reviewed and instructions given in writing.   ? Alpha one def > need to check next ov   ? Aspiration > nothing to suggest by hx   ? Adverse drug effects > no longer on Amiodarone and says feels better off it so hard to tell - in setting of known lung dz would avoid if possible  Bronchiectasis > see sep a/p  Beta blockers probably ok here if needed but would avoid high doses and opt for bisoprolol instead  CHF > clearly has element of diastolic dysfunction with edema on exam > defer rx to cards     .

## 2020-03-25 ENCOUNTER — Other Ambulatory Visit: Payer: Self-pay

## 2020-03-25 MED ORDER — AMLODIPINE BESYLATE 2.5 MG PO TABS: 2.5000 mg | ORAL_TABLET | Freq: Every day | ORAL | 6 refills | Status: DC

## 2020-03-25 NOTE — Telephone Encounter (Signed)
Per Pt. Please call in amlodipine 2.5mg  to [facility]. Pt states [facility] informed her they will not fill because they have never filled before. Her PMD, Dr.Rucker will not refill because it was not prescribed by her.  If needed call [phone]  Thanks renee

## 2020-03-25 NOTE — Telephone Encounter (Signed)
Refill complete 

## 2020-03-31 ENCOUNTER — Institutional Professional Consult (permissible substitution): Payer: Medicare Other | Admitting: Critical Care Medicine

## 2020-04-05 ENCOUNTER — Ambulatory Visit: Payer: Medicare Other | Admitting: Internal Medicine

## 2020-04-14 ENCOUNTER — Ambulatory Visit: Payer: Medicare Other | Admitting: Internal Medicine

## 2020-04-27 ENCOUNTER — Ambulatory Visit: Payer: Medicare Other | Admitting: Internal Medicine

## 2020-04-30 ENCOUNTER — Other Ambulatory Visit: Payer: Self-pay

## 2020-04-30 ENCOUNTER — Other Ambulatory Visit (HOSPITAL_COMMUNITY)
Admission: RE | Admit: 2020-04-30 | Discharge: 2020-04-30 | Disposition: A | Discharge status: Home / Self Care | Payer: Medicare Other | Source: Ambulatory Visit | Attending: Internal Medicine | Admitting: Internal Medicine

## 2020-04-30 DIAGNOSIS — Z20822 Contact with and (suspected) exposure to covid-19: Secondary | ICD-10-CM | POA: Diagnosis not present

## 2020-04-30 DIAGNOSIS — Z01812 Encounter for preprocedural laboratory examination: Secondary | ICD-10-CM | POA: Diagnosis present

## 2020-04-30 LAB — SARS CORONAVIRUS 2 (TAT 6-24 HRS): SARS Coronavirus 2: NEGATIVE

## 2020-05-04 ENCOUNTER — Other Ambulatory Visit: Payer: Self-pay

## 2020-05-04 ENCOUNTER — Ambulatory Visit (HOSPITAL_COMMUNITY)
Admission: RE | Admit: 2020-05-04 | Discharge: 2020-05-04 | Disposition: A | Discharge status: Home / Self Care | Payer: Medicare Other | Source: Ambulatory Visit | Attending: Internal Medicine | Admitting: Internal Medicine

## 2020-05-04 DIAGNOSIS — J479 Bronchiectasis, uncomplicated: Secondary | ICD-10-CM | POA: Diagnosis present

## 2020-05-04 DIAGNOSIS — R0600 Dyspnea, unspecified: Secondary | ICD-10-CM | POA: Diagnosis not present

## 2020-05-04 DIAGNOSIS — R0609 Other forms of dyspnea: Secondary | ICD-10-CM

## 2020-05-04 LAB — PULMONARY FUNCTION TEST
DL/VA % pred: 102 %
DL/VA: 4.23 ml/min/mmHg/L
DLCO unc % pred: 77 %
DLCO unc: 12.98 ml/min/mmHg
FEF 25-75 Pre: 1.91 L/sec
FEF2575-%Pred-Pre: 176 %
FEV1-%Pred-Pre: 100 %
FEV1-Pre: 1.56 L
FEV1FVC-%Pred-Pre: 115 %
FEV6-%Pred-Pre: 92 %
FEV6-Pre: 1.85 L
FEV6FVC-%Pred-Pre: 106 %
FVC-%Pred-Pre: 87 %
FVC-Pre: 1.85 L
Pre FEV1/FVC ratio: 85 %
Pre FEV6/FVC Ratio: 100 %
RV % pred: 103 %
RV: 2.38 L
TLC % pred: 93 %
TLC: 4.31 L

## 2020-05-10 ENCOUNTER — Other Ambulatory Visit: Payer: Self-pay

## 2020-05-10 ENCOUNTER — Ambulatory Visit (INDEPENDENT_AMBULATORY_CARE_PROVIDER_SITE_OTHER): Payer: Medicare Other | Admitting: Internal Medicine

## 2020-05-10 VITALS — BP 118/68 | HR 83 | Ht 61.0 in | Wt 142.0 lb

## 2020-05-10 DIAGNOSIS — I10 Essential (primary) hypertension: Secondary | ICD-10-CM

## 2020-05-10 DIAGNOSIS — I493 Ventricular premature depolarization: Secondary | ICD-10-CM

## 2020-05-10 DIAGNOSIS — I5032 Chronic diastolic (congestive) heart failure: Secondary | ICD-10-CM

## 2020-05-10 NOTE — Patient Instructions (Signed)
Medication Instructions:  Your physician recommends that you continue on your current medications as directed. Please refer to the Current Medication list given to you today.  *If you need a refill on your cardiac medications before your next appointment, please call your pharmacy*   Lab Work: NONE   If you have labs (blood work) drawn today and your tests are completely normal, you will receive your results only by: . MyChart Message (if you have MyChart) OR . A paper copy in the mail If you have any lab test that is abnormal or we need to change your treatment, we will call you to review the results.   Testing/Procedures: NONE    Follow-Up: At [facility], you and your health needs are our priority.  As part of our continuing mission to provide you with exceptional heart care, we have created designated Provider Care Teams.  These Care Teams include your primary Cardiologist (physician) and Advanced Practice Providers (APPs -  Physician Assistants and Nurse Practitioners) who all work together to provide you with the care you need, when you need it.  We recommend signing up for the patient portal called "MyChart".  Sign up information is provided on this After Visit Summary.  MyChart is used to connect with patients for Virtual Visits (Telemedicine).  Patients are able to view lab/test results, encounter notes, upcoming appointments, etc.  Non-urgent messages can be sent to your provider as well.   To learn more about what you can do with MyChart, go to [email or web address].    Your next appointment:   1 year(s)  The format for your next appointment:   In Person  Provider:   Gregg Taylor, MD   Other Instructions Thank you for choosing [facility]!    

## 2020-05-10 NOTE — Progress Notes (Signed)
HPI Rhonda Spears returns today for followup. She is a pleasant 86 yo woman with a h/o PVC's, COPD, and diastolic heart failure. She was placed on amiodarone and her PVC's improved. However, she developed problems with her balance and I stopped the amiodarone 3 months ago. She has not had much in the way of symptoms. She denies chest pain or sob. She has developed edema since starting amlodipine.  Allergies  Allergen Reactions  . Latex Itching  . Omeprazole Palpitations  . Tramadol Itching and Nausea Only  . Sulfa Antibiotics Nausea Only and Other (See Comments)    Severe headaches  . Zanaflex [Tizanidine Hcl] Itching and Other (See Comments)    oversedation     Current Outpatient Medications  Medication Sig Dispense Refill  . allopurinol (ZYLOPRIM) 100 MG tablet Take 100 mg by mouth at bedtime.     Marland Kitchen amLODipine (NORVASC) 2.5 MG tablet Take 1 tablet (2.5 mg total) by mouth daily. 30 tablet 6  . aspirin EC 81 MG tablet Take 81 mg by mouth every Monday, Wednesday, and Friday.     . Calcium Carb-Cholecalciferol (CALCIUM 600 + D PO) Take 1 tablet by mouth daily.    . Carboxymethylcellulose Sod PF (THERATEARS PF) 0.25 % SOLN Place 1 drop into both eyes 2 (two) times daily as needed (dry eyes).    . cholecalciferol (VITAMIN D) 1000 units tablet Take 1,000 Units by mouth every morning.     . diclofenac sodium (VOLTAREN) 1 % GEL Apply 2 g topically 2 (two) times daily as needed (pain).     Marland Kitchen docusate sodium (COLACE) 100 MG capsule Take 100 mg by mouth as needed.     . famotidine (PEPCID) 20 MG tablet One after bfast and supper 60 tablet 2  . folic acid (FOLVITE) 400 MCG tablet Take 400 mcg by mouth daily.    . furosemide (LASIX) 20 MG tablet Take 1 tablet (20 mg total) by mouth daily. 90 tablet 3  . HYDROcodone-acetaminophen (NORCO/VICODIN) 5-325 MG tablet Take 1 tablet by mouth every 6 (six) hours as needed for moderate pain.    . Magnesium 250 MG TABS Take 250 mg by mouth daily.    .  Multiple Vitamin (MULTIVITAMIN) capsule Take 1 capsule by mouth daily.    . nitroGLYCERIN (NITROSTAT) 0.4 MG SL tablet Place 1 tablet (0.4 mg total) under the tongue every 5 (five) minutes as needed for chest pain. 30 tablet 0   No current facility-administered medications for this visit.     Past Medical History:  Diagnosis Date  . Chest pain with moderate risk for cardiac etiology 03/30/2017  . CKD (chronic kidney disease), stage III   . COPD (chronic obstructive pulmonary disease) (HCC)   . Gout   . Hypertension   . PVC's (premature ventricular contractions)   . Renal disorder   . Sinus bradycardia   . Tricuspid regurgitation   . Venous insufficiency     ROS:   All systems reviewed and negative except as noted in the HPI.   Past Surgical History:  Procedure Laterality Date  . ABDOMINAL HYSTERECTOMY    . arthroscopic left knee    . INTRAMEDULLARY (IM) NAIL INTERTROCHANTERIC Right 06/12/2018   Procedure: INTRAMEDULLARY (IM) NAIL INTERTROCHANTRIC;  Surgeon: Bjorn Pippin, MD;  Location: [facility] OR;  Service: Orthopedics;  Laterality: Right;  . repair left femoral neck    . right medial men       Family History  Problem Relation  Age of Onset  . Heart attack Mother   . Hypertension Mother   . Early death Father 10       Car accident     Social History   Socioeconomic History  . Marital status: Divorced    Spouse name: Not on file  . Number of children: 10  . Years of education: Not on file  . Highest education level: Not on file  Occupational History  . Occupation: Retired  Tobacco Use  . Smoking status: Never Smoker  . Smokeless tobacco: Never Used  Vaping Use  . Vaping Use: Never used  Substance and Sexual Activity  . Alcohol use: No  . Drug use: No  . Sexual activity: Not on file  Other Topics Concern  . Not on file  Social History Narrative   Pt lives alone in [place], [place]   Social Determinants of Health   Financial Resource Strain: Low Risk   .  Difficulty of Paying Living Expenses: Not very hard  Food Insecurity: No Food Insecurity  . Worried About Programme researcher, broadcasting/film/video in the Last Year: Never true  . Ran Out of Food in the Last Year: Never true  Transportation Needs: No Transportation Needs  . Lack of Transportation (Medical): No  . Lack of Transportation (Non-Medical): No  Physical Activity: Inactive  . Days of Exercise per Week: 0 days  . Minutes of Exercise per Session: 0 min  Stress: No Stress Concern Present  . Feeling of Stress : Not at all  Social Connections: Moderately Integrated  . Frequency of Communication with Friends and Family: Three times a week  . Frequency of Social Gatherings with Friends and Family: Three times a week  . Attends Religious Services: More than 4 times per year  . Active Member of Clubs or Organizations: Yes  . Attends Banker Meetings: More than 4 times per year  . Marital Status: Divorced  Catering manager Violence: Not At Risk  . Fear of Current or Ex-Partner: No  . Emotionally Abused: No  . Physically Abused: No  . Sexually Abused: No     BP 118/68   Pulse 83   Ht 5\' 1"  (1.549 m)   Wt 142 lb (64.4 kg)   SpO2 94%   BMI 26.83 kg/m   Physical Exam:  Well appearing NAD HEENT: Unremarkable Neck:  No JVD, no thyromegally Lymphatics:  No adenopathy Back:  No CVA tenderness Lungs:  Clear with no wheezes HEART:  Regular rate rhythm, no murmurs, no rubs, no clicks Abd:  soft, positive bowel sounds, no organomegally, no rebound, no guarding Ext:  2 plus pulses, 1+ edema, no cyanosis, no clubbing Skin:  No rashes no nodules Neuro:  CN II through XII intact, motor grossly intact   Assess/Plan: 1. PVC's - she has not had much in the way of symptoms. I suspect that her PVC's will return off of amiodarone. However she feels better. 2. Dizziness - this has resolved after stopping amiodarone. We will follow. 3. Diastolic heart failure - her symptoms are class 2. I asked  her to reduce her salt intake. 4. HTN - her bp is better on amlodipine although her peripheral edema is worse.  .D.

## 2020-05-11 NOTE — Progress Notes (Signed)
Spoke with pt and notified of results per Dr. Wert. Pt verbalized understanding and denied any questions. 

## 2020-05-17 ENCOUNTER — Other Ambulatory Visit: Payer: Self-pay

## 2020-05-17 ENCOUNTER — Encounter: Payer: Self-pay | Admitting: Internal Medicine

## 2020-05-17 ENCOUNTER — Other Ambulatory Visit (HOSPITAL_COMMUNITY)
Admission: RE | Admit: 2020-05-17 | Discharge: 2020-05-17 | Disposition: A | Discharge status: Home / Self Care | Payer: Medicare Other | Source: Ambulatory Visit | Attending: Internal Medicine | Admitting: Internal Medicine

## 2020-05-17 ENCOUNTER — Ambulatory Visit (INDEPENDENT_AMBULATORY_CARE_PROVIDER_SITE_OTHER): Payer: Medicare Other | Admitting: Internal Medicine

## 2020-05-17 DIAGNOSIS — R0600 Dyspnea, unspecified: Secondary | ICD-10-CM | POA: Diagnosis not present

## 2020-05-17 DIAGNOSIS — J479 Bronchiectasis, uncomplicated: Secondary | ICD-10-CM | POA: Insufficient documentation

## 2020-05-17 DIAGNOSIS — R0609 Other forms of dyspnea: Secondary | ICD-10-CM

## 2020-05-17 LAB — CBC WITH DIFFERENTIAL/PLATELET
Abs Immature Granulocytes: 0.05 10*3/uL (ref 0.00–0.07)
Basophils Absolute: 0.1 10*3/uL (ref 0.0–0.1)
Basophils Relative: 1 %
Eosinophils Absolute: 0.2 10*3/uL (ref 0.0–0.5)
Eosinophils Relative: 3 %
HCT: 41.7 % (ref 36.0–46.0)
Hemoglobin: 13.6 g/dL (ref 12.0–15.0)
Immature Granulocytes: 1 %
Lymphocytes Relative: 44 %
Lymphs Abs: 3.7 10*3/uL (ref 0.7–4.0)
MCH: 32.5 pg (ref 26.0–34.0)
MCHC: 32.6 g/dL (ref 30.0–36.0)
MCV: 99.5 fL (ref 80.0–100.0)
Monocytes Absolute: 0.6 10*3/uL (ref 0.1–1.0)
Monocytes Relative: 7 %
Neutro Abs: 3.9 10*3/uL (ref 1.7–7.7)
Neutrophils Relative %: 44 %
Platelets: 215 10*3/uL (ref 150–400)
RBC: 4.19 MIL/uL (ref 3.87–5.11)
RDW: 14.2 % (ref 11.5–15.5)
WBC: 8.6 10*3/uL (ref 4.0–10.5)
nRBC: 0 % (ref 0.0–0.2)

## 2020-05-17 NOTE — Patient Instructions (Addendum)
Ok to use aleve in moderation with meals as needed but let your doctors know you are one it.  Amlodipine is the likely the cause of your swelling > call cardiology since they prescribed    Bronchiectasis =   you have scarring of your bronchial tubes which means that they don't function perfectly normally and mucus tends to pool in certain areas of your lung which can cause pneumonia and further scarring of your lung and bronchial tubes  Whenever you develop cough congestion take mucinex or mucinex dm > these will help keep the mucus loose and flowing but if your condition worsens you need to seek help immediately preferably here or somewhere inside the [facility] system to compare xrays ( worse = darker or bloody mucus or pain on breathing in)     Please remember to go to the lab department   for your tests - we will call you with the results when they are available.       Please schedule a follow up visit in 6 months but call sooner if needed for   Cough/ congestion breathing problems chest pain when you breath

## 2020-05-17 NOTE — Progress Notes (Signed)
Rhonda Spears, female    DOB: [birth year],     MRN: [id number]   Brief patient profile:  86 yowf minimal smoking hx f/b [place] with dx of bronchiectasis and progressively worse doe / cough onset ?  around 2015 > admit [facility]  Admit date: 02/28/2020 Discharge date: 03/01/2020  Time spent: 30 minutes  Recommendations for Outpatient Follow-up:Chest pain -heart score 4, chest pain improved with nitroglycerin,recurrent in ED.No significant history of coronary artery disease, nuclear stress test 2019 low risk study. EKG, troponin so far unremarkable. -Trend troponin Results for VANNESA, ABAIR (MRN [id number]) as of 03/01/2020 09:24  Ref. Range 02/28/2020 15:27 02/28/2020 17:25 02/28/2020 20:45  Troponin I (High Sensitivity) Latest Ref Range: <18 ng/L 4 4 5   -EKG;first-degree AV block. -Aspirin 324mg  given,continue 81 mg daily.  COPD -Stable. -Xopenex nebulizedPRN  Bronchiectasis -PCXRshowing biapical scarring, increased density right lung apex,see results below. -5/2CT chest;consistent with bronchiectasis. No features to indicate lung cancer. -Schedule establish care appointment in 1 to 2 weeks with Dr.PraveenMannamPCCM work-up bronchiectasis/inflammatory bronchiolitis.   ChronicDiastolic CHF -5/2 echocardiogram; diastolic CHF see results below -Strict in and out -Daily weight -Amlodipine 2.5 mg daily; patient had been on amiodarone previously from description of symptoms sounds as if she was bradycardic. -Continue LasixPRN(home regimen) -Obtain orthostatic vitals. Ambulate patient in A.m. and if BP controlled and tolerating new medication will discharge. -5/2schedule follow-up appointment in 2 weeks with Dr. cardiology, chronic diastolic CHF, essential HTN - Essential HTN (uncontrolled) -Furosemide 20 mg dailyPRN -Labetalol 10 mgPRN - PRNLabetalol10mg ,systolic> 170.  Symptomatic PVCs -patient follows withcardiologistDr. 03-12-1994. Last visit  January 2021, recommended holding Amiodaronefor 3 months,as patient had problems with balance.  CKD stage IIIa (baselineCr1.16) Last Labs       Recent Labs  Lab 02/28/20 1527 02/29/20 0807  CREATININE 1.16* 1.13*      HLD -5/2LDL=66 -Controlled. Patient since that medication will allow her PCP to decide if/when began statin     Discharge Diagnoses:  Active Problems:   Chest pain   Bronchiectasis (HCC)   Chronic diastolic CHF (congestive heart failure) (HCC)   CKD (chronic kidney disease), stage III   Essential hypertension   Discharge Condition: Stable  Diet recommendation: Heart healthy      Filed Weights   02/29/20 0004 03/01/20 0500  Weight: 63.9 kg 63.9 kg    History of present illness:  86 y.o.WF PMHxessential HTN (uncontrolled), TV regurgitation, COPD, CKD stage III  Presented to the ED with complaints of chest pain that started at about 9 AM this morning when she woke up,she reports chest pressure mid to left side of her chest, with tightness radiating to her left arm and jaw.She reports 2 hours later at about 11 AM pain became sharp. Pain is not related to activity.No prior episodes of similar chest pain. Chest pain lastsfew seconds and resolves. Reports several episodes.She was given aspirin and nitro in theED,and chest pain severity has improved. She has chronic unchanged difficulty breathing, and cough.  ED Course:Blood pressure elevated systolic 180s.Troponin unremarkable at 4. EKG with sinus rhythm without significant ST or T wave abnormalities. Portable chest x-ray shows biapical scarring, increased density right lung apex, nonemergent follow-up CT chest recommended. With concern for ACS,recurrence of intermittent pain, hospitalist to admit for chest pain.      Procedures: 5/1 PCXR; no acute cardiopulmonary process. -Extensive biapical scarring with suggestion of increasing density at the right lung  apex. 5/2 echocardiogram;Left Ventricle: LVEF=60 to 65%. -Grade I diastolic dysfunction (impaired  relaxation). 5/2 CT chest Wo contrast;.-Widespread moderate cylindrical and varicoid bronchiectasis in lungs, most prominent in the medial upper lobes and medial right middle lobe. Scattered mucoid impaction and bronchial wall thickening at the areas of bronchiectasis. Findings are compatible with chronic infectious or inflammatory bronchiolitis such as due to atypical mycobacterial infection (MAI). -Extensive patchy subpleural reticulation and irregular bandlike consolidation, most prominent at the lung apices, unchanged,compatible with chronic postinfectious scarring. -New mild right pericardiophrenic lymphadenopathy, nonspecific.Recommend attention on follow-up chest CT with IV contrast in 3 months. -4. Three-vessel coronary atherosclerosis. -5. Small hiatal hernia.       History of Present Illness  03/18/2020  Pulmonary/ 1st office eval/Daxton Nydam :  Cough/sob post hosp/ not back to baseline  Chief Complaint  Patient presents with   Hospitalization Follow-up    SOB with walking approx 30 ft. Former Dr Juanetta Gosling pt. She has occ cough with white sputum.  Dyspnea:  MMRC2 = can't walk a nl pace on a flat grade s sob but does fine slow and flat/ can still do [employer] s stopping due to sob but has to lean on cart / now sob x30  ft slow p admit Cough: variable mostly dry, always at hs and has lump in throat and urge to clear continuous x onset of sob  Sleep: fine once asleep SABA use: not helpful, makes heart race  No more cp rec Pepcid 20 mg after breakfast and supper until return  GERD diet Keep walking as much as you can especially when you can lean on the shopping cart  Please schedule a follow up office visit in 4 weeks, sooner if needed with pfts prior to visit   Add:  needs alpha one/ Ig's/ quant gold tb to complete the w/u     05/17/2020  f/u ov/Aixa Corsello re: bronchiectasis  Chief  Complaint  Patient presents with   Follow-up    Patient still has shortness of breath states when she takes Aleve or something to help with her pain her breathing gets better. Dry cough. Foot and ankle sweeling   Dyspnea:  Back to baseline / walking hallways/ can do anytihng  bent over shopping cart at grocery store does fine Cough: much better  Sleeping: fine flat one pillow  SABA use: none  02: none    No obvious day to day or daytime variability or assoc excess/ purulent sputum or mucus plugs or hemoptysis or cp or chest tightness, subjective wheeze or overt sinus or hb symptoms.   Sleeping  without nocturnal  or early am exacerbation  of respiratory  c/o's or need for noct saba. Also denies any obvious fluctuation of symptoms with weather or environmental changes or other aggravating or alleviating factors except as outlined above   No unusual exposure hx or h/o childhood pna/ asthma or knowledge of premature birth.  Current Allergies, Complete Past Medical History, Past Surgical History, Family History, and Social History were reviewed in [employer] record.  ROS  The following are not active complaints unless bolded Hoarseness, sore throat, dysphagia, dental problems, itching, sneezing,  nasal congestion or discharge of excess mucus or purulent secretions, ear ache,   fever, chills, sweats, unintended wt loss or wt gain, classically pleuritic or exertional cp,  orthopnea pnd or arm/hand swelling  or leg swelling(after started amlodipine, not aleve) , presyncope, palpitations, abdominal pain, anorexia, nausea, vomiting, diarrhea  or change in bowel habits or change in bladder habits, change in stools or change in urine, dysuria, hematuria,  rash, arthralgias, visual complaints, headache, numbness, weakness or ataxia or problems with walking or coordination,  change in mood or  memory.        Current Meds  Medication Sig   allopurinol (ZYLOPRIM) 100 MG tablet  Take 100 mg by mouth at bedtime.    amLODipine (NORVASC) 2.5 MG tablet Take 1 tablet (2.5 mg total) by mouth daily.   aspirin EC 81 MG tablet Take 81 mg by mouth every Monday, Wednesday, and Friday.    Calcium Carb-Cholecalciferol (CALCIUM 600 + D PO) Take 1 tablet by mouth daily.   Carboxymethylcellulose Sod PF (THERATEARS PF) 0.25 % SOLN Place 1 drop into both eyes 2 (two) times daily as needed (dry eyes).   cholecalciferol (VITAMIN D) 1000 units tablet Take 1,000 Units by mouth every morning.    diclofenac sodium (VOLTAREN) 1 % GEL Apply 2 g topically 2 (two) times daily as needed (pain).    docusate sodium (COLACE) 100 MG capsule Take 100 mg by mouth as needed.    famotidine (PEPCID) 20 MG tablet One after bfast and supper   folic acid (FOLVITE) 400 MCG tablet Take 400 mcg by mouth daily.   furosemide (LASIX) 20 MG tablet Take 1 tablet (20 mg total) by mouth daily.   HYDROcodone-acetaminophen (NORCO/VICODIN) 5-325 MG tablet Take 1 tablet by mouth every 6 (six) hours as needed for moderate pain.   Magnesium 250 MG TABS Take 250 mg by mouth daily.   Multiple Vitamin (MULTIVITAMIN) capsule Take 1 capsule by mouth daily.   nitroGLYCERIN (NITROSTAT) 0.4 MG SL tablet Place 1 tablet (0.4 mg total) under the tongue every 5 (five) minutes as needed for chest pain.          Past Medical History:  Diagnosis Date   Chest pain with moderate risk for cardiac etiology 03/30/2017   CKD (chronic kidney disease), stage III    COPD (chronic obstructive pulmonary disease) (HCC)    Gout    Hypertension    PVC's (premature ventricular contractions)    Renal disorder    Sinus bradycardia    Tricuspid regurgitation    Venous insufficiency         Objective:     Wt Readings from Last 3 Encounters:  05/17/20 142 lb (64.4 kg)  05/10/20 142 lb (64.4 kg)  03/18/20 143 lb (64.9 kg)     Vital signs reviewed - Note on arrival 02 sats  95% on RA       amb pleasant wf nad     HEENT : pt wearing mask not removed for exam due to covid -19 concerns.    NECK :  without JVD/Nodes/TM/ nl carotid upstrokes bilaterally   LUNGS: no acc muscle use,  Mod kyphotic contour chest with minimal insp/exp rhonchi  bilaterally without cough on insp or exp maneuvers   CV:  RRR  no s3 or murmur or increase in P2, and L>R pitting edema both LE   ABD:  soft and nontender with nl inspiratory excursion in the supine position. No bruits or organomegaly appreciated, bowel sounds nl  MS:  Nl gait/ ext warm without deformities, calf tenderness, cyanosis or clubbing No obvious joint restrictions   SKIN: warm and dry without lesions    NEURO:  alert, approp, nl sensorium with  no motor or cerebellar deficits apparent.     Labs ordered 05/17/2020  :  allergy profile   alpha one AT phenotype   Quant Igs  Assessment

## 2020-05-17 NOTE — Assessment & Plan Note (Addendum)
Onset of symptoms around 2015  02/29/20 CT chest Wo contrast;.-Widespread moderate cylindrical and varicoid bronchiectasis in lungs, most prominent in the medial upper lobes and medial right middle lobe. Scattered mucoid impaction and bronchial wall thickening at the areas of bronchiectasis. Findings are compatible with chronic infectious or inflammatory bronchiolitis such as due to atypical mycobacterial infection (MAI). -Extensive patchy subpleural reticulation and irregular bandlike consolidation, most prominent at the lung apices, unchanged,compatible with chronic postinfectious scarring. -New mild right pericardiophrenic lymphadenopathy, nonspecific.Recommend attention on follow-up chest CT with IV contrast in 3 months. -4. Three-vessel coronary atherosclerosis. -5. Small hiatal hernia.  Needs w/u (see labs pending) but no rx.  Re ? MAI?  This is an extremely common benign condition in the elderly and does not warrant aggressive eval/ rx at this point unless there is a clinical correlation suggesting unaddressed pulmonary infection (purulent sputum, night sweats, unintended wt loss, doe) or evolution of  obvious changes on plain cxr (as opposed to serial CT, which is way over sensitive to make clinical decisions re intervention and treatment in the elderly, who tend to tolerate both dx and treatment poorly) .   Discussed in detail all the  indications, usual  risks and alternatives  relative to the benefits with patient who agrees to proceed with Rx as outlined.              Each maintenance medication was reviewed in detail including emphasizing most importantly the difference between maintenance and prns and under what circumstances the prns are to be triggered using an action plan format where appropriate.  Total time for H and P, chart review, counseling, teaching device and generating customized AVS unique to this summary office visit / charting = 30 min

## 2020-05-17 NOTE — Assessment & Plan Note (Signed)
Onset around 2015 with bronchiectasis and no better p saba - Echo 02/29/20  1. Left ventricular ejection fraction, by estimation, is 60 to 65%. The  left ventricle has normal function. The left ventricle has no regional  wall motion abnormalities. There is mild concentric left ventricular  hypertrophy. Left ventricular diastolic parameters are consistent with Grade I diastolic dysfunction (impaired  relaxation).  2. Right ventricular systolic function is normal. The right ventricular  size is normal. There is normal pulmonary artery systolic pressure. The  estimated right ventricular systolic pressure is 28.6 mmHg.  3. Left atrial size was mildly dilated.  4. The mitral valve is normal in structure. Mild mitral valve  regurgitation. No evidence of mitral stenosis.  5. Tricuspid valve regurgitation is moderate.  6. The aortic valve is normal in structure. Aortic valve regurgitation is  not visualized. No aortic stenosis is present.  7. The inferior vena cava is normal in size with greater than 50%  respiratory variability, suggesting right atrial pressure of 3 mmHg. -  03/18/2020   Walked RA  216ft each @ slow  pace stopped due to end of study sob with sats  98% PFT's  05/04/2020  FEV1 1.56 (100 % ) ratio 0.85  p 0 % improvement from saba p 0 prior to study with DLCO  12.98 (77%) corrects to 4.23 (102%)  for alv volume and FV curve nl   No role for bronchodilators/ rx edema per cards (? Combo of aleve and amlopidine? )

## 2020-05-18 LAB — IGG, IGA, IGM
IgA: 771 mg/dL — ABNORMAL HIGH (ref 64–422)
IgG (Immunoglobin G), Serum: 1397 mg/dL (ref 586–1602)
IgM (Immunoglobulin M), Srm: 62 mg/dL (ref 26–217)

## 2020-05-19 LAB — QUANTIFERON-TB GOLD PLUS (RQFGPL)
QuantiFERON Mitogen Value: >10 IU/mL
QuantiFERON Nil Value: 0.72 IU/mL
QuantiFERON TB1 Ag Value: 0.7 IU/mL
QuantiFERON TB2 Ag Value: 0.79 IU/mL

## 2020-05-19 LAB — IGE: IgE (Immunoglobulin E), Serum: 22 IU/mL (ref 6–495)

## 2020-05-19 LAB — QUANTIFERON-TB GOLD PLUS: QuantiFERON-TB Gold Plus: NEGATIVE

## 2020-05-20 LAB — ALPHA-1 ANTITRYPSIN PHENOTYPE: A-1 Antitrypsin, Ser: 128 mg/dL (ref 101–187)

## 2020-05-26 ENCOUNTER — Telehealth: Payer: Self-pay | Admitting: Internal Medicine

## 2020-05-26 NOTE — Telephone Encounter (Signed)
Pt is calling requesting to know the results of her lab work which was performed after last OV on 7/19. Dr. Sherene Sires, please advise.

## 2020-05-26 NOTE — Telephone Encounter (Signed)
Spoke with pt and advised of lab results per Dr Sherene Sires. Pt verbalized understanding. Nothing further needed.

## 2020-05-26 NOTE — Telephone Encounter (Signed)
Sorry for the delay, it dribbled back in piece by piece and I was waiting for the last one - all are nl, no change in recs

## 2020-09-20 ENCOUNTER — Other Ambulatory Visit: Payer: Self-pay | Admitting: Physician Assistant

## 2020-10-07 ENCOUNTER — Encounter: Payer: Self-pay | Admitting: Orthopaedic Surgery

## 2020-10-07 ENCOUNTER — Other Ambulatory Visit: Payer: Self-pay

## 2020-10-07 ENCOUNTER — Ambulatory Visit (INDEPENDENT_AMBULATORY_CARE_PROVIDER_SITE_OTHER): Payer: Medicare Other | Admitting: Orthopaedic Surgery

## 2020-10-07 VITALS — BP 167/93 | HR 72 | Ht 61.0 in | Wt 139.0 lb

## 2020-10-07 DIAGNOSIS — M545 Low back pain, unspecified: Secondary | ICD-10-CM

## 2020-10-07 NOTE — Progress Notes (Signed)
Office Visit Note   Patient: Rhonda Spears           Date of Birth: [birth year]           MRN: [id number] Visit Date: 10/07/2020              Requested by: Suzan Slick, MD [address] [place],  [place] [address] PCP: Suzan Slick, MD   Assessment & Plan: Visit Diagnoses:  1. Acute low back pain, unspecified back pain laterality, unspecified whether sciatica present   2. Acute midline low back pain, unspecified whether sciatica present     Plan: MRI scan lumbar spine rule out compression fracture versus disc herniation with her 10 out of 10 pain.  Office follow-up after scan for review.  Follow-Up Instructions: No follow-ups on file.   Orders:  Orders Placed This Encounter  Procedures  . MR Lumbar Spine w/o contrast   No orders of the defined types were placed in this encounter.     Procedures: No procedures performed   Clinical Data: No additional findings.   Subjective: Chief Complaint  Patient presents with  . Lower Back - Pain    HPI 86 year old female seen with her daughter with complaints of low back pain started 3 weeks ago severe radiating more to the right side she rates the pain 10 out of 10 she is having to walk with the cane states she has a walker at home she has been using has great difficulty getting from sitting to standing.  Patient had abnormality on CT of the chest abnormal PET scan June 2020 with nodule in the lung suggestive of cancer.  Patient did not want to get a biopsy at her age.  She has had hip fractures in the past treated with drug nails.  Most recent hip surgery was August 2019 done by Dr.Varkey on the right hip.  Patient has back pain radiates into her right thigh she states her legs feel somewhat weak no numbness or tingling in her feet.  Patient has significant scoliosis with about a 35 degree curve apex right L2 level.  Patient had x-rays urgent care was told she had a compression fracture.  I reviewed these images  downloaded from the disc and is difficult to determine.  She may have an endplate fracture at L2.  Review of Systems plans for hypertension history lung lesion with abnormal PET scan.  States her kidney disease diastolic heart failure previous hip fractures osteoporosis.  All other systems noncontributory.   Objective: Vital Signs: BP (!) 167/93   Pulse 72   Ht 5\' 1"  (1.549 m)   Wt 139 lb (63 kg)   BMI 26.26 kg/m   Physical Exam Constitutional:      Appearance: She is well-developed.     Comments: Thin frail conversant, pleasant.  HENT:     Head: Normocephalic.     Right Ear: External ear normal.     Left Ear: External ear normal.  Eyes:     Pupils: Pupils are equal, round, and reactive to light.  Neck:     Thyroid: No thyromegaly.     Trachea: No tracheal deviation.  Cardiovascular:     Rate and Rhythm: Normal rate.  Pulmonary:     Effort: Pulmonary effort is normal.  Abdominal:     Palpations: Abdomen is soft.  Skin:    General: Skin is warm and dry.  Neurological:     Mental Status: She is alert and  oriented to person, place, and time.  Psychiatric:        Mood and Affect: Mood and affect normal.        Behavior: Behavior normal.     Ortho Exam negative logroll of the hip she is amatory with a cane.  Right lumbar curvature.  Tenderness of the right paralumbar region.  Specialty Comments:  No specialty comments available.  Imaging: No results found.   PMFS History: Patient Active Problem List   Diagnosis Date Noted  . Low back pain 10/07/2020  . Bronchiectasis (HCC) 02/29/2020  . Chronic diastolic CHF (congestive heart failure) (HCC) 02/29/2020  . CKD (chronic kidney disease), stage III (HCC) 02/29/2020  . Essential hypertension 02/29/2020  . Chest pain 02/28/2020  . Nodule of right lung 06/30/2019  . Closed right hip fracture, initial encounter (HCC) 06/12/2018  . Lower extremity edema 05/29/2018  . PVC's (premature ventricular contractions)  05/09/2018  . CKD (chronic kidney disease) stage 3, GFR 30-59 ml/min (HCC) 04/20/2018  . HTN (hypertension) 03/30/2017  . Atypical chest pain 03/30/2017  . Chronic headache disorder 03/30/2017  . Osteoarthritis 03/30/2017  . Venous insufficiency 03/30/2017  . Diverticulosis 03/30/2017  . Bigeminy 03/30/2017  . DOE (dyspnea on exertion) 03/30/2017  . Idiopathic hypotension 03/30/2017  . Chest pain with moderate risk for cardiac etiology 03/30/2017  . Gout   . Renal disorder    Past Medical History:  Diagnosis Date  . Chest pain with moderate risk for cardiac etiology 03/30/2017  . CKD (chronic kidney disease), stage III (HCC)   . COPD (chronic obstructive pulmonary disease) (HCC)   . Gout   . Hypertension   . PVC's (premature ventricular contractions)   . Renal disorder   . Sinus bradycardia   . Tricuspid regurgitation   . Venous insufficiency     Family History  Problem Relation Age of Onset  . Heart attack Mother   . Hypertension Mother   . Early death Father 31       Car accident    Past Surgical History:  Procedure Laterality Date  . ABDOMINAL HYSTERECTOMY    . arthroscopic left knee    . INTRAMEDULLARY (IM) NAIL INTERTROCHANTERIC Right 06/12/2018   Procedure: INTRAMEDULLARY (IM) NAIL INTERTROCHANTRIC;  Surgeon: Bjorn Pippin, MD;  Location: [facility] OR;  Service: Orthopedics;  Laterality: Right;  . repair left femoral neck    . right medial men     Social History   Occupational History  . Occupation: Retired  Tobacco Use  . Smoking status: Never Smoker  . Smokeless tobacco: Never Used  Vaping Use  . Vaping Use: Never used  Substance and Sexual Activity  . Alcohol use: No  . Drug use: No  . Sexual activity: Not on file

## 2020-10-20 ENCOUNTER — Other Ambulatory Visit: Payer: Self-pay

## 2020-10-20 ENCOUNTER — Ambulatory Visit (HOSPITAL_COMMUNITY)
Admission: RE | Admit: 2020-10-20 | Discharge: 2020-10-20 | Disposition: A | Discharge status: Home / Self Care | Payer: Medicare Other | Source: Ambulatory Visit | Attending: Orthopaedic Surgery | Admitting: Orthopaedic Surgery

## 2020-10-20 DIAGNOSIS — M545 Low back pain, unspecified: Secondary | ICD-10-CM | POA: Diagnosis present

## 2020-10-28 ENCOUNTER — Other Ambulatory Visit: Payer: Self-pay

## 2020-10-28 ENCOUNTER — Ambulatory Visit (INDEPENDENT_AMBULATORY_CARE_PROVIDER_SITE_OTHER): Payer: Medicare Other | Admitting: Orthopaedic Surgery

## 2020-10-28 ENCOUNTER — Encounter: Payer: Self-pay | Admitting: Orthopaedic Surgery

## 2020-10-28 VITALS — Ht 61.0 in | Wt 139.0 lb

## 2020-10-28 DIAGNOSIS — S32040D Wedge compression fracture of fourth lumbar vertebra, subsequent encounter for fracture with routine healing: Secondary | ICD-10-CM | POA: Diagnosis not present

## 2020-10-28 DIAGNOSIS — M545 Low back pain, unspecified: Secondary | ICD-10-CM | POA: Diagnosis not present

## 2020-10-28 DIAGNOSIS — S32000A Wedge compression fracture of unspecified lumbar vertebra, initial encounter for closed fracture: Secondary | ICD-10-CM | POA: Insufficient documentation

## 2020-10-28 NOTE — Progress Notes (Signed)
Office Visit Note   Patient: Rhonda Spears           Date of Birth: 09-Aug-1934           MRN: [id number] Visit Date: 10/28/2020              Requested by: Suzan Slick, MD [address] [place] [place],  [place] [address] PCP: Suzan Slick, MD   Assessment & Plan: Visit Diagnoses:  1. Acute low back pain, unspecified back pain laterality, unspecified whether sciatica present   2. Compression fracture of L4 vertebra with routine healing, subsequent encounter     Plan: We will set patient up for some physical therapy possibly on a pheresis or use of a TENS to give her some relief.  I gave her a copy report she does have L4-5 stenosis.  Follow-Up Instructions: Return in about 6 weeks (around 12/09/2020).   Orders:  Orders Placed This Encounter  Procedures  . Ambulatory referral to Physical Therapy   No orders of the defined types were placed in this encounter.     Procedures: No procedures performed   Clinical Data: No additional findings.   Subjective: Chief Complaint  Patient presents with  . Lower Back - Pain, Follow-up    MRI review    HPI 86 year old female returns with ongoing low back pain that started about 5 or 6 weeks ago.  She is a trouble getting from sitting to standing.  She has a nodule in her lung has been worked up with CT scans, PET scans etc.  She states that her age she is decided to refuse the biopsy of the lung nodule.  MRI scan has been obtained for severe back pain and there is changes suggest a superior endplate fracture at L4 of 25% .   She does have severe multifactorial stenosis with anterolisthesis of 6 or 7 mm but has not had a history of claudication symptoms.  She takes have pain: Needed does not take it daily.  She has a walker and cane.  No recent falls.  Past history of hip fracture which healed nicely.  Review of Systems all other systems noncontributory to HPI.   Objective: Vital Signs: Ht 5\' 1"  (1.549 m)   Wt 139 lb (63 kg)    BMI 26.26 kg/m   Physical Exam Constitutional:      Appearance: She is well-developed.  HENT:     Head: Normocephalic.     Right Ear: External ear normal.     Left Ear: External ear normal.  Eyes:     Pupils: Pupils are equal, round, and reactive to light.  Neck:     Thyroid: No thyromegaly.     Trachea: No tracheal deviation.  Cardiovascular:     Rate and Rhythm: Normal rate.  Pulmonary:     Effort: Pulmonary effort is normal.  Abdominal:     Palpations: Abdomen is soft.  Skin:    General: Skin is warm and dry.  Neurological:     Mental Status: She is alert and oriented to person, place, and time.  Psychiatric:        Mood and Affect: Mood and affect normal.        Behavior: Behavior normal.     Ortho Exam patient has increased pain with lateral tilting sitting to standing lumbar bending and extension.  Patient is ambulatory with a cane.  Specialty Comments:  No specialty comments available.  Imaging: CLINICAL DATA:  Scoliosis.  Back pain over the last 3 weeks.  EXAM: MRI LUMBAR SPINE WITHOUT CONTRAST  TECHNIQUE: Multiplanar, multisequence MR imaging of the lumbar spine was performed. No intravenous contrast was administered.  COMPARISON:  None.  FINDINGS: Segmentation:  5 lumbar type vertebral bodies.  Alignment: Scoliotic curvature convex to the right at the thoracolumbar junction and to the left in the lower lumbar region. Anterolisthesis at L4-5 of 6-7 mm.  Vertebrae: Acute or subacute superior endplate fracture at L4 with loss of height of 25 percent.  Mild posterior bowing of the posterosuperior margin of the vertebral body. Mild inferior endplate edema in the right posterior corner at L3.  Conus medullaris and cauda equina: Conus extends to the T12 level. Conus and cauda equina appear normal.  Paraspinal and other soft tissues: Negative  Disc levels:  Disc degeneration with loss of disc height, endplate osteophytes and bulging of  the discs at T11-12, T12-L1 and L1-2: Facet degeneration and hypertrophy on the left. Narrowing of the left lateral recess and foramina at T12-L1 and L1-2, largely secondary to the curvature.  L2-3: Endplate osteophytes and bulging of the disc. Facet and ligamentous hypertrophy. Stenosis of both lateral recesses and neural foramina.  L3-4: Mild bulging of the disc. Facet and ligamentous hypertrophy. Stenosis of both lateral recesses and neural foramina that could cause neural compression.  L4-5: Chronic facet arthropathy with 6-7 mm of anterolisthesis. Bulging of the disc. Severe multifactorial spinal stenosis that could cause neural compression on either or both sides. Foraminal stenosis on the right could focally affect the L4 nerve.  L5-S1: Endplate osteophytes and disc protrusion more prominent in the right posterolateral direction. Facet and ligamentous hypertrophy. Stenosis of the subarticular lateral recess and intervertebral foramen on the right that could cause right-sided neural compression.  IMPRESSION: 1. Scoliotic curvature convex to the right at the thoracolumbar junction and to the left in the lower lumbar region. Anterolisthesis at L4-5 of 6-7 mm. 2. Acute or subacute superior endplate fracture at L4 with loss of height of 25%. Mild posterior bowing of the posterosuperior margin of the vertebral body. No compressive stenosis at this level. Mild inferior endplate edema on the right at L3 probably representing a minor inferior endplate fracture at this level. 3. Severe multifactorial spinal stenosis at L4-5 could cause neural compression on either or both sides. Foraminal stenosis on the right could affect the exiting L4 nerve. 4. Left lateral recess and foraminal narrowing at T12-L1 L1-2 because of the scoliotic curvature. 5. Lateral recess and foraminal stenosis at L2-3 and L3-4 that could possibly cause neural compression on either or both sides. 6. Right  lateral recess and foraminal stenosis at L5-S1 that could cause right-sided neural compression.   Electronically Signed   By: Paulina Fusi M.D.   On: 10/20/2020 15:10   PMFS History: Patient Active Problem List   Diagnosis Date Noted  . Lumbar compression fracture (HCC) 10/28/2020  . Low back pain 10/07/2020  . Bronchiectasis (HCC) 02/29/2020  . Chronic diastolic CHF (congestive heart failure) (HCC) 02/29/2020  . CKD (chronic kidney disease), stage III (HCC) 02/29/2020  . Essential hypertension 02/29/2020  . Chest pain 02/28/2020  . Nodule of right lung 06/30/2019  . Closed right hip fracture, initial encounter (HCC) 06/12/2018  . Lower extremity edema 05/29/2018  . PVC's (premature ventricular contractions) 05/09/2018  . CKD (chronic kidney disease) stage 3, GFR 30-59 ml/min (HCC) 04/20/2018  . HTN (hypertension) 03/30/2017  . Atypical chest pain 03/30/2017  . Chronic headache disorder 03/30/2017  .  Osteoarthritis 03/30/2017  . Venous insufficiency 03/30/2017  . Diverticulosis 03/30/2017  . Bigeminy 03/30/2017  . DOE (dyspnea on exertion) 03/30/2017  . Idiopathic hypotension 03/30/2017  . Chest pain with moderate risk for cardiac etiology 03/30/2017  . Gout   . Renal disorder    Past Medical History:  Diagnosis Date  . Chest pain with moderate risk for cardiac etiology 03/30/2017  . CKD (chronic kidney disease), stage III (HCC)   . COPD (chronic obstructive pulmonary disease) (HCC)   . Gout   . Hypertension   . PVC's (premature ventricular contractions)   . Renal disorder   . Sinus bradycardia   . Tricuspid regurgitation   . Venous insufficiency     Family History  Problem Relation Age of Onset  . Heart attack Mother   . Hypertension Mother   . Early death Father 61       Car accident    Past Surgical History:  Procedure Laterality Date  . ABDOMINAL HYSTERECTOMY    . arthroscopic left knee    . INTRAMEDULLARY (IM) NAIL INTERTROCHANTERIC Right 06/12/2018    Procedure: INTRAMEDULLARY (IM) NAIL INTERTROCHANTRIC;  Surgeon: Bjorn Pippin, MD;  Location: [facility] OR;  Service: Orthopedics;  Laterality: Right;  . repair left femoral neck    . right medial men     Social History   Occupational History  . Occupation: Retired  Tobacco Use  . Smoking status: Never Smoker  . Smokeless tobacco: Never Used  Vaping Use  . Vaping Use: Never used  Substance and Sexual Activity  . Alcohol use: No  . Drug use: No  . Sexual activity: Not on file

## 2020-12-16 ENCOUNTER — Other Ambulatory Visit: Payer: Self-pay

## 2020-12-16 ENCOUNTER — Encounter: Payer: Self-pay | Admitting: Orthopaedic Surgery

## 2020-12-16 ENCOUNTER — Ambulatory Visit (INDEPENDENT_AMBULATORY_CARE_PROVIDER_SITE_OTHER): Payer: Medicare Other | Admitting: Orthopaedic Surgery

## 2020-12-16 VITALS — Ht 61.0 in | Wt 139.0 lb

## 2020-12-16 DIAGNOSIS — S32040D Wedge compression fracture of fourth lumbar vertebra, subsequent encounter for fracture with routine healing: Secondary | ICD-10-CM

## 2020-12-16 NOTE — Progress Notes (Signed)
Office Visit Note   Patient: Rhonda Spears           Date of Birth: 05/11/1934           MRN: [id number] Visit Date: 12/16/2020              Requested by: Suzan Slick, MD [address] [address] [place],  [place] [address] PCP: Suzan Slick, MD   Assessment & Plan: Visit Diagnoses: No diagnosis found.  Plan: Patient will continue with calcium vitamin D walking program.  She is got history of kidney disease heart failure hypertension PVCs.  Currently claudication is not a problem.  Continue cane.  Fall prevention discussed.  Return as needed.  Follow-Up Instructions: No follow-ups on file.   Orders:  No orders of the defined types were placed in this encounter.  No orders of the defined types were placed in this encounter.     Procedures: No procedures performed   Clinical Data: No additional findings.   Subjective: Chief Complaint  Patient presents with  . Lower Back - Follow-up    HPI 87 year old female returns for follow-up from likely November L4 compression fracture.  Patient states she is off pain medication none in the last 3 weeks.  She is walking with a cane.  She uses the brace on her back intermittently.  She did have severe stenosis at L4-5 but primary pain was from the fracture.  She has noticed significant improvement in the last month.  Patient did therapy worked on stretching and extension exercises of the lumbar spine which she feels has been beneficial.  She continues some of the stretching exercises currently.  Review of Systems All other systems update unchanged.  Objective: Vital Signs: Ht 5\' 1"  (1.549 m)   Wt 139 lb (63 kg)   BMI 26.26 kg/m   Physical Exam Constitutional:      Appearance: She is well-developed.  HENT:     Head: Normocephalic.     Right Ear: External ear normal.     Left Ear: External ear normal.  Eyes:     Pupils: Pupils are equal, round, and reactive to light.  Neck:     Thyroid: No thyromegaly.     Trachea: No  tracheal deviation.  Cardiovascular:     Rate and Rhythm: Normal rate.  Pulmonary:     Effort: Pulmonary effort is normal.  Abdominal:     Palpations: Abdomen is soft.  Skin:    General: Skin is warm and dry.  Neurological:     Mental Status: She is alert and oriented to person, place, and time.  Psychiatric:        Mood and Affect: Mood and affect normal.        Behavior: Behavior normal.     Ortho Exam patient is amatory with a cane.  Right lumbar curvature.  Negative logroll the hips.  Quads anterior tib gastrocsoleus are active and symmetrical.  Specialty Comments:  No specialty comments available.  Imaging: No results found.   PMFS History: Patient Active Problem List   Diagnosis Date Noted  . Lumbar compression fracture (HCC) 10/28/2020  . Low back pain 10/07/2020  . Bronchiectasis (HCC) 02/29/2020  . Chronic diastolic CHF (congestive heart failure) (HCC) 02/29/2020  . CKD (chronic kidney disease), stage III (HCC) 02/29/2020  . Essential hypertension 02/29/2020  . Chest pain 02/28/2020  . Nodule of right lung 06/30/2019  . Closed right hip fracture, initial encounter (HCC) 06/12/2018  . Lower  extremity edema 05/29/2018  . PVC's (premature ventricular contractions) 05/09/2018  . CKD (chronic kidney disease) stage 3, GFR 30-59 ml/min (HCC) 04/20/2018  . HTN (hypertension) 03/30/2017  . Atypical chest pain 03/30/2017  . Chronic headache disorder 03/30/2017  . Osteoarthritis 03/30/2017  . Venous insufficiency 03/30/2017  . Diverticulosis 03/30/2017  . Bigeminy 03/30/2017  . DOE (dyspnea on exertion) 03/30/2017  . Idiopathic hypotension 03/30/2017  . Chest pain with moderate risk for cardiac etiology 03/30/2017  . Gout   . Renal disorder    Past Medical History:  Diagnosis Date  . Chest pain with moderate risk for cardiac etiology 03/30/2017  . CKD (chronic kidney disease), stage III (HCC)   . COPD (chronic obstructive pulmonary disease) (HCC)   . Gout   .  Hypertension   . PVC's (premature ventricular contractions)   . Renal disorder   . Sinus bradycardia   . Tricuspid regurgitation   . Venous insufficiency     Family History  Problem Relation Age of Onset  . Heart attack Mother   . Hypertension Mother   . Early death Father 52       Car accident    Past Surgical History:  Procedure Laterality Date  . ABDOMINAL HYSTERECTOMY    . arthroscopic left knee    . INTRAMEDULLARY (IM) NAIL INTERTROCHANTERIC Right 06/12/2018   Procedure: INTRAMEDULLARY (IM) NAIL INTERTROCHANTRIC;  Surgeon: Bjorn Pippin, MD;  Location: [facility] OR;  Service: Orthopedics;  Laterality: Right;  . repair left femoral neck    . right medial men     Social History   Occupational History  . Occupation: Retired  Tobacco Use  . Smoking status: Never Smoker  . Smokeless tobacco: Never Used  Vaping Use  . Vaping Use: Never used  Substance and Sexual Activity  . Alcohol use: No  . Drug use: No  . Sexual activity: Not on file

## 2021-04-19 ENCOUNTER — Encounter: Payer: Self-pay | Admitting: Internal Medicine

## 2021-04-19 ENCOUNTER — Ambulatory Visit (INDEPENDENT_AMBULATORY_CARE_PROVIDER_SITE_OTHER): Payer: Medicare Other | Admitting: Internal Medicine

## 2021-04-19 ENCOUNTER — Other Ambulatory Visit: Payer: Self-pay

## 2021-04-19 DIAGNOSIS — J479 Bronchiectasis, uncomplicated: Secondary | ICD-10-CM

## 2021-04-19 DIAGNOSIS — R0609 Other forms of dyspnea: Secondary | ICD-10-CM

## 2021-04-19 DIAGNOSIS — R0600 Dyspnea, unspecified: Secondary | ICD-10-CM

## 2021-04-19 NOTE — Progress Notes (Signed)
Samul Dada, female    DOB: 13-May-1934,     MRN: [id number]   Brief patient profile:  87 yowf minimal smoking hx f/b [place] with dx of bronchiectasis and progressively worse doe / cough onset ?  around 2015 > admit [facility]  Admit date: 02/28/2020 Discharge date: 03/01/2020   Time spent: 30 minutes   Recommendations for Outpatient Follow-up:Chest pain - heart score 4, chest pain improved with nitroglycerin, recurrent in ED. No significant history of coronary artery disease, nuclear stress test 2019 low risk study. EKG, troponin so far unremarkable. -Trend troponin Results for MARIADELALUZ, GUGGENHEIM (MRN [id number]) as of 03/01/2020 09:24   Ref. Range 02/28/2020 15:27 02/28/2020 17:25 02/28/2020 20:45  Troponin I (High Sensitivity) Latest Ref Range: <18 ng/L 4 4 5   -EKG; first-degree AV block. -Aspirin 324mg  given, continue 81 mg daily.   COPD -Stable. -Xopenex nebulized PRN   Bronchiectasis -PCXR showing biapical scarring, increased density right lung apex, see results below.  -5/2 CT chest; consistent with bronchiectasis.  No features to indicate lung cancer. -Schedule establish care appointment in 1 to 2 weeks with Dr. PCCM work-up bronchiectasis/inflammatory bronchiolitis.     Chronic Diastolic CHF  -5/2 echocardiogram; diastolic CHF see results below -Strict in and out -Daily weight -Amlodipine 2.5 mg daily; patient had been on amiodarone previously from description of symptoms sounds as if she was bradycardic. -Continue Lasix PRN (home regimen) -Obtain orthostatic vitals.  Ambulate patient in A.m. and if BP controlled and tolerating new medication will discharge. -5/2schedule follow-up appointment in 2 weeks with Dr. cardiology, chronic diastolic CHF, essential HTN - Essential HTN (uncontrolled) -Furosemide 20 mg daily PRN -Labetalol 10 mg  PRN - PRN Labetalol 10mg , systolic > 170.   Symptomatic PVCs -patient follows with cardiologist Dr. Chilton Greathouse. Last visit  January 2021, recommended holding Amiodarone for 3 months, as patient had problems with balance.   CKD stage IIIa (baseline Cr 1.16) Last Labs       Recent Labs  Lab 02/28/20 1527 02/29/20 0807  CREATININE 1.16* 1.13*        HLD -5/2 LDL= 66 -Controlled.  Patient since that medication will allow her PCP to decide if/when began statin         Discharge Diagnoses:  Active Problems:   Chest pain   Bronchiectasis (HCC)   Chronic diastolic CHF (congestive heart failure) (HCC)   CKD (chronic kidney disease), stage III   Essential hypertension     Discharge Condition: Stable   Diet recommendation: Heart healthy       Filed Weights    02/29/20 0004 03/01/20 0500  Weight: 63.9 kg 63.9 kg      History of present illness:  87 y.o. WF PMHx essential HTN (uncontrolled), TV regurgitation, COPD, CKD stage III    Presented to the ED with complaints of chest pain that started at about 9 AM this morning when she woke up, she reports chest pressure mid to left side of her chest, with tightness radiating to her left arm and jaw. She reports 2 hours later at about 11 AM pain became sharp. Pain is not related to activity. No prior episodes of similar chest pain. Chest pain lasts few seconds and resolves. Reports several episodes. She was given aspirin and nitro in the ED, and chest pain severity has improved. She has chronic unchanged difficulty breathing, and cough.   ED Course: Blood pressure elevated systolic 180s.  Troponin unremarkable at 4.  EKG with sinus  rhythm without significant ST or T wave abnormalities.  Portable chest x-ray shows biapical scarring, increased density right lung apex, nonemergent follow-up CT chest recommended.  With concern for ACS, recurrence of intermittent pain, hospitalist to admit for chest pain.         Procedures: 5/1 PCXR; no acute cardiopulmonary process. -Extensive biapical scarring with suggestion of increasing density at the right lung apex.   5/2 echocardiogram;Left Ventricle: LVEF=60 to 65%.  -Grade I diastolic dysfunction (impaired relaxation).  5/2 CT chest Wo contrast; .-Widespread moderate cylindrical and varicoid bronchiectasis in lungs, most prominent in the medial upper lobes and medial right middle lobe. Scattered mucoid impaction and bronchial wall thickening at the areas of bronchiectasis. Findings are compatible with chronic infectious or inflammatory bronchiolitis such as due to atypical mycobacterial infection (MAI). -Extensive patchy subpleural reticulation and irregular bandlike consolidation, most prominent at the lung apices, unchanged,compatible with chronic postinfectious scarring. - New mild right pericardiophrenic lymphadenopathy, nonspecific.Recommend attention on follow-up chest CT with IV contrast in 3 months. -4. Three-vessel coronary atherosclerosis. -5. Small hiatal hernia.          History of Present Illness  03/18/2020  Pulmonary/ 1st office eval/Charee Tumblin :  Cough/sob post hosp/ not back to baseline  Chief Complaint  Patient presents with   Hospitalization Follow-up    SOB with walking approx 30 ft. Former Dr Juanetta Gosling pt. She has occ cough with white sputum.  Dyspnea:  MMRC2 = can't walk a nl pace on a flat grade s sob but does fine slow and flat/ can still do [employer] s stopping due to sob but has to lean on cart / now sob x30  ft slow p admit Cough: variable mostly dry, always at hs and has lump in throat and urge to clear continuous x onset of sob  Sleep: fine once asleep SABA use: not helpful, makes heart race  No more cp rec Pepcid 20 mg after breakfast and supper until return  GERD diet Keep walking as much as you can especially when you can lean on the shopping cart  Please schedule a follow up office visit in 4 weeks, sooner if needed with pfts prior to visit   Add:  needs alpha one/ Ig's/ quant gold tb to complete the w/u     05/17/2020  f/u ov/Crissie Aloi re: bronchiectasis  Chief Complaint   Patient presents with   Follow-up    Patient still has shortness of breath states when she takes Aleve or something to help with her pain her breathing gets better. Dry cough. Foot and ankle sweeling   Dyspnea:  Back to baseline / walking hallways/ can do anytihng  bent over shopping cart at grocery store does fine Cough: much better  Sleeping: fine flat one pillow  SABA use: none  02: none  Rec Ok to use aleve in moderation with meals as needed but let your doctors know you are one it. Amlodipine is the likely the cause of your swelling > call cardiology since they prescribed  Bronchiectasis    Please remember to go to the lab department     Alpha one AT 05/17/2020  MM Quant Ig's 05/17/2020  nl Quant GOLD TB 05/17/2020  neg Please schedule a follow up visit in 6 months but call sooner if needed for   Cough/ congestion breathing problems chest pain when you breath     04/19/2021  f/u ov/[place] office/[facility] Start re:  bronchiectasis / uacs  Chief Complaint  Patient presents with  Follow-up    Pt states breathing "may be a little better some days"- some days are worse due to back pain. She has occ cough with clear sputum.   Dyspnea:  seems to correlated with ms back pain  Cough: after activity slt rattling, no production/ occurs on insp maneuvers  Sleeping: sleeping flat bed one pillow  SABA use: none  02: none  Covid status: vax x 3 Lung cancer screening: n/a    No obvious day to day or daytime variability or assoc excess/ purulent sputum or mucus plugs or hemoptysis or cp or chest tightness, subjective wheeze or overt sinus or hb symptoms.   Sleeping  without nocturnal  or early am exacerbation  of respiratory  c/o's or need for noct saba. Also denies any obvious fluctuation of symptoms with weather or environmental changes or other aggravating or alleviating factors except as outlined above   No unusual exposure hx or h/o childhood pna/ asthma or knowledge of premature  birth.  Current Allergies, Complete Past Medical History, Past Surgical History, Family History, and Social History were reviewed in [facility] electronic medical record.  ROS  The following are not active complaints unless bolded Hoarseness, sore throat, dysphagia, dental problems, itching, sneezing,  nasal congestion or discharge of excess mucus or purulent secretions, ear ache,   fever, chills, sweats, unintended wt loss or wt gain, classically pleuritic or exertional cp,  orthopnea pnd or arm/hand swelling  or leg swelling, presyncope, palpitations, abdominal pain, anorexia, nausea, vomiting, diarrhea  or change in bowel habits or change in bladder habits, change in stools or change in urine, dysuria, hematuria,  rash, arthralgias, visual complaints, headache, numbness, weakness or ataxia or problems with walking or coordination,  change in mood or  memory.        Current Meds  Medication Sig   allopurinol (ZYLOPRIM) 100 MG tablet Take 100 mg by mouth at bedtime.    amLODipine (NORVASC) 2.5 MG tablet Take 1 tablet by mouth once daily   aspirin EC 81 MG tablet Take 81 mg by mouth every Monday, Wednesday, and Friday.    Calcium Carb-Cholecalciferol (CALCIUM 600 + D PO) Take 1 tablet by mouth daily.   calcium carbonate (TUMS - DOSED IN MG ELEMENTAL CALCIUM) 500 MG chewable tablet Chew 1 tablet by mouth as needed for indigestion or heartburn.   Carboxymethylcellulose Sod PF (THERATEARS PF) 0.25 % SOLN Place 1 drop into both eyes 2 (two) times daily as needed (dry eyes).   cholecalciferol (VITAMIN D) 1000 units tablet Take 1,000 Units by mouth every morning.    diclofenac sodium (VOLTAREN) 1 % GEL Apply 2 g topically 2 (two) times daily as needed (pain).    docusate sodium (COLACE) 100 MG capsule Take 100 mg by mouth as needed.    folic acid (FOLVITE) 400 MCG tablet Take 400 mcg by mouth daily.   furosemide (LASIX) 20 MG tablet Take 1 tablet (20 mg total) by mouth daily.    HYDROcodone-acetaminophen (NORCO/VICODIN) 5-325 MG tablet Take 1 tablet by mouth every 6 (six) hours as needed for moderate pain.   Magnesium 250 MG TABS Take 250 mg by mouth daily.   Multiple Vitamin (MULTIVITAMIN) capsule Take 1 capsule by mouth daily.   nitroGLYCERIN (NITROSTAT) 0.4 MG SL tablet Place 1 tablet (0.4 mg total) under the tongue every 5 (five) minutes as needed for chest pain.              Past Medical History:  Diagnosis Date  Chest pain with moderate risk for cardiac etiology 03/30/2017   CKD (chronic kidney disease), stage III    COPD (chronic obstructive pulmonary disease) (HCC)    Gout    Hypertension    PVC's (premature ventricular contractions)    Renal disorder    Sinus bradycardia    Tricuspid regurgitation    Venous insufficiency         Objective:     04/19/2021       135   05/17/20 142 lb (64.4 kg)  05/10/20 142 lb (64.4 kg)  03/18/20 143 lb (64.9 kg)      Vital signs reviewed  04/19/2021  - Note at rest 02 sats  98% on RA   General appearance:    amb pleasant wf nad    HEENT : pt wearing mask not removed for exam due to covid - 19 concerns.   NECK :  without JVD/Nodes/TM/ nl carotid upstrokes bilaterally   LUNGS: no acc muscle use,  Min barrel / mod kyphotic  contour chest wall with bilateral  slightly decreased bs s audible wheeze and  without cough on insp or exp maneuvers and min  Hyperresonant  to  percussion bilaterally     CV:  RRR  no s3 or murmur or increase in P2, and trace bilateral R > L LE edema   ABD:  soft and nontender with pos end  insp Hoover's  in the supine position. No bruits or organomegaly appreciated, bowel sounds nl  MS:   Nl gait/  ext warm without deformities, calf tenderness, cyanosis or clubbing No obvious joint restrictions   SKIN: warm and dry without lesions    NEURO:  alert, approp, nl sensorium with  no motor or cerebellar deficits apparent.                    Assessment

## 2021-04-19 NOTE — Assessment & Plan Note (Signed)
Onset around 2015 with bronchiectasis and no better p saba - Echo 02/29/20  1. Left ventricular ejection fraction, by estimation, is 60 to 65%. The  left ventricle has normal function. The left ventricle has no regional  wall motion abnormalities. There is mild concentric left ventricular  hypertrophy. Left ventricular diastolic parameters are consistent with Grade I diastolic dysfunction (impaired  relaxation).  2. Right ventricular systolic function is normal. The right ventricular  size is normal. There is normal pulmonary artery systolic pressure. The  estimated right ventricular systolic pressure is 28.6 mmHg.  3. Left atrial size was mildly dilated.  4. The mitral valve is normal in structure. Mild mitral valve  regurgitation. No evidence of mitral stenosis.  5. Tricuspid valve regurgitation is moderate.  6. The aortic valve is normal in structure. Aortic valve regurgitation is  not visualized. No aortic stenosis is present.  7. The inferior vena cava is normal in size with greater than 50%  respiratory variability, suggesting right atrial pressure of 3 mmHg. -  03/18/2020   Walked RA  245ft each @ slow  pace stopped due to end of study sob with sats  98% PFT's  05/04/2020  FEV1 1.56 (100 % ) ratio 0.85  p 0 % improvement from saba p 0 prior to study with DLCO  12.98 (77%) corrects to 4.23 (102%)  for alv volume and FV curve nl   No indication for bronchodilators at present.          Each maintenance medication was reviewed in detail including emphasizing most importantly the difference between maintenance and prns and under what circumstances the prns are to be triggered using an action plan format where appropriate.  Total time for H and P, chart review, counseling, and generating customized AVS unique to this office visit / same day charting  > 30 min

## 2021-04-19 NOTE — Patient Instructions (Addendum)
I would recommend considering treatment of your osteoporosis and since you can't take omeprazole like drugs  for your reflux and you have a Hiatal hernia  I would favor using prolia or reclast.    No pulmonary follow up is needed at this point in this clinic  - the pulmonary doctors at [facility] have an interest in your condition called bronchiectasis and I will ask one of our [facility] pulmonary doctors who to send you to there.

## 2021-04-19 NOTE — Assessment & Plan Note (Addendum)
Onset of symptoms around 2015 02/29/20 CT chest Wo contrast;.-Widespread moderate cylindrical and varicoid bronchiectasis in lungs, most prominent in the medial upper lobes and medial right middle lobe. Scattered mucoid impaction and bronchial wall thickening at the areas of bronchiectasis. Findings are compatible with chronic infectious or inflammatory bronchiolitis such as due to atypical mycobacterial infection (MAI). -Extensive patchy subpleural reticulation and irregular bandlike consolidation, most prominent at the lung apices, unchanged,compatible with chronic postinfectious scarring. -New mild right pericardiophrenic lymphadenopathy, nonspecific.Recommend attention on follow-up chest CT with IV contrast in 3 months. -4. Three-vessel coronary atherosclerosis. -5. Small hiatal hernia. Alpha one AT 05/17/2020  MM Quant Ig's 05/17/2020  nl Quant GOLD TB 05/17/2020  Neg  Cough has more upper than lower airway features at present but presence of bronchiectasis and HH worrisome for GERD contributing to symptoms either way  - she declines to consider even starting pepcid based on what she's heard (and probably needs reclast or prolia rx for osteo rather than oral biphosphonates)    >>>  I would prefer she see either Dr Judeth Horn in [place] or [facility] pulmonary where there is interest in this chronic problem with no easy answers  as it is likely to worsen over time > advised.

## 2021-04-27 ENCOUNTER — Telehealth: Payer: Self-pay | Admitting: *Deleted

## 2021-04-27 DIAGNOSIS — J479 Bronchiectasis, uncomplicated: Secondary | ICD-10-CM

## 2021-04-27 NOTE — Telephone Encounter (Signed)
Referral sent to [facility].

## 2021-04-27 NOTE — Telephone Encounter (Signed)
-----   Message from Nyoka Cowden, MD sent at 04/27/2021 12:33 PM EDT ----- Sorry this was supposed to go to you ----- Message ----- From: Nyoka Cowden, MD Sent: 04/20/2021  11:03 AM EDT To: Nyoka Cowden, MD  Please refer her to Rolm Baptise at [facility] pulmonary dx bronchiectasis

## 2021-05-18 ENCOUNTER — Ambulatory Visit (INDEPENDENT_AMBULATORY_CARE_PROVIDER_SITE_OTHER): Payer: Medicare Other | Admitting: Internal Medicine

## 2021-05-18 ENCOUNTER — Encounter: Payer: Self-pay | Admitting: Internal Medicine

## 2021-05-18 ENCOUNTER — Other Ambulatory Visit: Payer: Self-pay

## 2021-05-18 VITALS — BP 138/78 | HR 77 | Ht 61.0 in | Wt 136.0 lb

## 2021-05-18 DIAGNOSIS — I10 Essential (primary) hypertension: Secondary | ICD-10-CM | POA: Diagnosis not present

## 2021-05-18 NOTE — Patient Instructions (Signed)

## 2021-05-18 NOTE — Progress Notes (Signed)
HPI Rhonda Spears returns today for followup. She is a pleasant 87 yo woman with a h/o PVC's, COPD, and diastolic heart failure. She was placed on amiodarone and her PVC's improved. However, she developed problems with her balance and I stopped the amiodarone over a year ago. She has not had much in the way of symptoms. She denies chest pain or sob. She has developed edema since starting amlodipine which is mild. Allergies  Allergen Reactions   Latex Itching   Omeprazole Palpitations   Tramadol Itching and Nausea Only   Sulfa Antibiotics Nausea Only and Other (See Comments)    Severe headaches   Zanaflex [Tizanidine Hcl] Itching and Other (See Comments)    oversedation     Current Outpatient Medications  Medication Sig Dispense Refill   allopurinol (ZYLOPRIM) 100 MG tablet Take 100 mg by mouth at bedtime.      amLODipine (NORVASC) 2.5 MG tablet Take 1 tablet by mouth once daily 90 tablet 2   aspirin EC 81 MG tablet Take 81 mg by mouth every Monday, Wednesday, and Friday.      Calcium Carb-Cholecalciferol (CALCIUM 600 + D PO) Take 1 tablet by mouth daily.     calcium carbonate (TUMS - DOSED IN MG ELEMENTAL CALCIUM) 500 MG chewable tablet Chew 1 tablet by mouth as needed for indigestion or heartburn.     Carboxymethylcellulose Sod PF (THERATEARS PF) 0.25 % SOLN Place 1 drop into both eyes 2 (two) times daily as needed (dry eyes).     cholecalciferol (VITAMIN D) 1000 units tablet Take 1,000 Units by mouth every morning.      diclofenac sodium (VOLTAREN) 1 % GEL Apply 2 g topically 2 (two) times daily as needed (pain).      docusate sodium (COLACE) 100 MG capsule Take 100 mg by mouth as needed.      folic acid (FOLVITE) 400 MCG tablet Take 400 mcg by mouth daily.     furosemide (LASIX) 20 MG tablet Take 1 tablet (20 mg total) by mouth daily. 90 tablet 3   HYDROcodone-acetaminophen (NORCO/VICODIN) 5-325 MG tablet Take 1 tablet by mouth every 6 (six) hours as needed for moderate pain.      Magnesium 250 MG TABS Take 250 mg by mouth daily.     Multiple Vitamin (MULTIVITAMIN) capsule Take 1 capsule by mouth daily.     nitroGLYCERIN (NITROSTAT) 0.4 MG SL tablet Place 1 tablet (0.4 mg total) under the tongue every 5 (five) minutes as needed for chest pain. 30 tablet 0   No current facility-administered medications for this visit.     Past Medical History:  Diagnosis Date   Chest pain with moderate risk for cardiac etiology 03/30/2017   CKD (chronic kidney disease), stage III (HCC)    COPD (chronic obstructive pulmonary disease) (HCC)    Gout    Hypertension    PVC's (premature ventricular contractions)    Renal disorder    Sinus bradycardia    Tricuspid regurgitation    Venous insufficiency     ROS:   All systems reviewed and negative except as noted in the HPI.   Past Surgical History:  Procedure Laterality Date   ABDOMINAL HYSTERECTOMY     arthroscopic left knee     INTRAMEDULLARY (IM) NAIL INTERTROCHANTERIC Right 06/12/2018   Procedure: INTRAMEDULLARY (IM) NAIL INTERTROCHANTRIC;  Surgeon: Bjorn Pippin, MD;  Location: [facility] OR;  Service: Orthopedics;  Laterality: Right;   repair left femoral neck  right medial men       Family History  Problem Relation Age of Onset   Heart attack Mother    Hypertension Mother    Early death Father 60       Car accident     Social History   Socioeconomic History   Marital status: Divorced    Spouse name: Not on file   Number of children: 10   Years of education: Not on file   Highest education level: Not on file  Occupational History   Occupation: Retired  Tobacco Use   Smoking status: Never   Smokeless tobacco: Never  Vaping Use   Vaping Use: Never used  Substance and Sexual Activity   Alcohol use: No   Drug use: No   Sexual activity: Not on file  Other Topics Concern   Not on file  Social History Narrative   Pt lives alone in [place], [place]   Social Determinants of Health   Financial Resource  Strain: Not on file  Food Insecurity: Not on file  Transportation Needs: Not on file  Physical Activity: Not on file  Stress: Not on file  Social Connections: Not on file  Intimate Partner Violence: Not on file     BP 138/78   Pulse 77   Ht 5\' 1"  (1.549 m)   Wt 136 lb (61.7 kg)   SpO2 95%   BMI 25.70 kg/m   Physical Exam:  Well appearing NAD HEENT: Unremarkable Neck:  No JVD, no thyromegally Lymphatics:  No adenopathy Back:  No CVA tenderness Lungs:  Clear HEART:  Regular rate rhythm, no murmurs, no rubs, no clicks Abd:  soft, positive bowel sounds, no organomegally, no rebound, no guarding Ext:  2 plus pulses, no edema, no cyanosis, no clubbing Skin:  No rashes no nodules Neuro:  CN II through XII intact, motor grossly intact  EKG  DEVICE  Normal device function.  See PaceArt for details.   Assess/Plan:  1. PVC's - she has not had much in the way of symptoms. I suspect that her PVC's will return off of amiodarone. However she feels better. 2. Dizziness - this has resolved after stopping amiodarone. We will follow. 3. Diastolic heart failure - her symptoms are class 2. I asked her to reduce her salt intake. 4. HTN - her bp is better on amlodipine although her peripheral edema is worse.   .D.

## 2021-10-04 ENCOUNTER — Other Ambulatory Visit: Payer: Self-pay

## 2021-10-04 ENCOUNTER — Ambulatory Visit: Payer: Medicare Other | Attending: Sports Medicine | Admitting: Physical Therapy

## 2021-10-04 ENCOUNTER — Encounter: Payer: Self-pay | Admitting: Physical Therapy

## 2021-10-04 DIAGNOSIS — R293 Abnormal posture: Secondary | ICD-10-CM | POA: Diagnosis present

## 2021-10-04 DIAGNOSIS — M5442 Lumbago with sciatica, left side: Secondary | ICD-10-CM | POA: Diagnosis present

## 2021-10-04 DIAGNOSIS — G8929 Other chronic pain: Secondary | ICD-10-CM | POA: Diagnosis present

## 2021-10-04 DIAGNOSIS — M5441 Lumbago with sciatica, right side: Secondary | ICD-10-CM | POA: Insufficient documentation

## 2021-10-04 DIAGNOSIS — M25511 Pain in right shoulder: Secondary | ICD-10-CM | POA: Diagnosis present
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
## 2021-10-06 ENCOUNTER — Ambulatory Visit: Payer: Medicare Other

## 2021-10-13 ENCOUNTER — Ambulatory Visit: Payer: Medicare Other | Admitting: Physical Therapy

## 2021-10-13 ENCOUNTER — Encounter: Payer: Self-pay | Admitting: Physical Therapy

## 2021-10-13 ENCOUNTER — Other Ambulatory Visit: Payer: Self-pay

## 2021-10-13 DIAGNOSIS — M25511 Pain in right shoulder: Secondary | ICD-10-CM | POA: Diagnosis not present

## 2021-10-13 DIAGNOSIS — R293 Abnormal posture: Secondary | ICD-10-CM

## 2021-10-13 DIAGNOSIS — M5442 Lumbago with sciatica, left side: Secondary | ICD-10-CM

## 2021-10-13 NOTE — Therapy (Signed)
[facility] [place], [place], [address] Phone: [phone]   Fax:  [phone]  Physical Therapy Treatment  Patient Details  Name: Rhonda Spears MRN: [id number] Date of Birth: May 19, 1934 Referring Provider (PT): Wandra Feinstein MD   Encounter Date: 10/13/2021   PT End of Session - 10/13/21 1432     Visit Number 2    Number of Visits 12    Date for PT Re-Evaluation 01/02/22    Authorization Type FOTO AT LEAST EVERY 5TH VISIT.  PROGRESS NOTE AT 10TH VISIT.  KX MODIFIER AFTER 15 VISITS.    PT Start Time 1349    PT Stop Time 1430    PT Time Calculation (min) 41 min    Activity Tolerance Patient limited by pain    Behavior During Therapy [facility] for tasks assessed/performed             Past Medical History:  Diagnosis Date   Chest pain with moderate risk for cardiac etiology 03/30/2017   CKD (chronic kidney disease), stage III (HCC)    COPD (chronic obstructive pulmonary disease) (HCC)    Gout    Hypertension    PVC's (premature ventricular contractions)    Renal disorder    Sinus bradycardia    Tricuspid regurgitation    Venous insufficiency     Past Surgical History:  Procedure Laterality Date   ABDOMINAL HYSTERECTOMY     arthroscopic left knee     INTRAMEDULLARY (IM) NAIL INTERTROCHANTERIC Right 06/12/2018   Procedure: INTRAMEDULLARY (IM) NAIL INTERTROCHANTRIC;  Surgeon: Hiram Gash, MD;  Location: [place];  Service: Orthopedics;  Laterality: Right;   repair left femoral neck     right medial men      There were no vitals filed for this visit.   Subjective Assessment - 10/13/21 1430     Subjective COVID-19 screen performed prior to patient entering clinic. Reports more pain with reaching OH or flexion.    Pertinent History OP, COPD, HTN, bilateral hip ORIF, femoral fracture, heart trouble, jaw fracture.    How long can you sit comfortably? Unlimited.    How long can you stand comfortably? 5 minutes.     How long can you walk comfortably? 5 minutes.    Patient Stated Goals reach into microwave with right UE and walk with less pain.    Currently in Pain? Yes    Pain Score 2     Pain Location Shoulder    Pain Orientation Right    Pain Descriptors / Indicators Discomfort    Pain Type Chronic pain    Pain Onset More than a month ago    Pain Frequency Intermittent    Aggravating Factors  OH reaching, flexion    Pain Relieving Factors rest    Multiple Pain Sites Yes    Pain Score 5    Pain Location Back    Pain Orientation Lower    Pain Descriptors / Indicators Discomfort    Pain Type Chronic pain    Pain Onset More than a month ago    Pain Frequency Constant                [facility] PT Assessment - 10/13/21 0001       Assessment   Medical Diagnosis Right shoulder and lumbar pain.    Referring Provider (PT) Wandra Feinstein MD      Precautions   Precaution Comments OP.      Restrictions   Weight Bearing Restrictions No  [facility] Adult PT Treatment/Exercise - 10/13/21 0001       Exercises   Exercises Shoulder;Lumbar      Shoulder Exercises: Seated   External Rotation AAROM;Both;20 reps      Shoulder Exercises: ROM/Strengthening   [place] seated; flex/ext x3 min, CW circles x2 min      Modalities   Modalities Electrical Stimulation;Moist Heat;Ultrasound      Moist Heat Therapy   Number Minutes Moist Heat 15 Minutes    Moist Heat Location Shoulder;Lumbar Spine      Electrical Stimulation   Electrical Stimulation Location R shoulder; B low back    Electrical Stimulation Action 2 channels: Pre-Mod    Electrical Stimulation Parameters 80-150 hz x15 min    Electrical Stimulation Goals Pain      Ultrasound   Ultrasound Location R anterior shoulder    Ultrasound Parameters Combo 1.5 w/cm2, 100%, 1 mhz x10 min    Ultrasound Goals Pain                          PT Long Term Goals - 10/04/21 1523       PT LONG  TERM GOAL #1   Title Independent with a HEP.    Time 6    Period Weeks    Status New      PT LONG TERM GOAL #2   Title Active right shoulder flexion to 130 degrees so the patient can easily reach overhead.    Time 6    Period Weeks    Status New      PT LONG TERM GOAL #3   Title Stand and walk 15 minutes with low back pain-level not > 3-4/10.    Time 6    Period Weeks    Status New      PT LONG TERM GOAL #4   Title Eliminate posterior thigh symptoms.    Time 6    Period Weeks    Status New                   Plan - 10/13/21 1436     Clinical Impression Statement Patient presented in clinic with reports of pain in anterior R shoulder that has been going on for months in which limits reaching and OH. Patient limited with standing or walking for prolonged periods due to pain that radiates down LEs. Patient initiated with light AAROM exercises with limitations to avoid pain. Patient is especially  Normal modalities response noted following removal of the modalities.    Personal Factors and Comorbidities Comorbidity 1;Comorbidity 2;Other    Comorbidities OP, COPD, HTN, bilateral hip ORIF, femoral fracture, heart trouble, jaw fracture.    Examination-Activity Limitations Stand;Other;Locomotion Level;Reach Overhead    Examination-Participation Restrictions Other;Meal Prep    Stability/Clinical Decision Making Evolving/Moderate complexity    Rehab Potential Good    PT Frequency 2x / week    PT Duration 6 weeks    PT Treatment/Interventions ADLs/Self Care Home Management;Cryotherapy;Electrical Stimulation;Ultrasound;Moist Heat;Iontophoresis 4mg /ml Dexamethasone;Therapeutic activities;Therapeutic exercise;Manual techniques;Patient/family education;Passive range of motion;Dry needling;Vasopneumatic Device    PT Next Visit Plan Combo e'stim/US to right anterior shoulder, AAROM progressing to low-level strengthening, low-level core exercise progression.  HMP, electrical stimulation.     Consulted and Agree with Plan of Care Patient             Patient will benefit from skilled therapeutic intervention in order to improve the following deficits and impairments:  Pain, Abnormal gait, Decreased activity  tolerance, Decreased range of motion, Postural dysfunction, Decreased strength  Visit Diagnosis: Chronic right shoulder pain  Chronic midline low back pain with bilateral sciatica  Abnormal posture     Problem List Patient Active Problem List   Diagnosis Date Noted   Lumbar compression fracture ([place]) 10/28/2020   Low back pain 10/07/2020   Bronchiectasis ([place]) 02/29/2020   Chronic diastolic CHF (congestive heart failure) ([place]) 02/29/2020   CKD (chronic kidney disease), stage III ([place]) 02/29/2020   Essential hypertension 02/29/2020   Chest pain 02/28/2020   Nodule of right lung 06/30/2019   Closed right hip fracture, initial encounter ([place]) 06/12/2018   Lower extremity edema 05/29/2018   PVC's (premature ventricular contractions) 05/09/2018   CKD (chronic kidney disease) stage 3, GFR 30-59 ml/min ([place]) 04/20/2018   HTN (hypertension) 03/30/2017   Atypical chest pain 03/30/2017   Chronic headache disorder 03/30/2017   Osteoarthritis 03/30/2017   Venous insufficiency 03/30/2017   Diverticulosis 03/30/2017   Bigeminy 03/30/2017   DOE (dyspnea on exertion) 03/30/2017   Idiopathic hypotension 03/30/2017   Chest pain with moderate risk for cardiac etiology 03/30/2017   Gout    Renal disorder     Standley Brooking, PTA 10/13/2021, 3:00 PM  [facility] [place], [place], [address] Phone: [phone]   Fax:  [phone]  Name: Rhonda Spears MRN: [id number] Date of Birth: 04-15-1934

## 2021-10-18 ENCOUNTER — Ambulatory Visit: Payer: Medicare Other | Admitting: Physical Therapy

## 2021-10-18 ENCOUNTER — Other Ambulatory Visit: Payer: Self-pay

## 2021-10-18 DIAGNOSIS — G8929 Other chronic pain: Secondary | ICD-10-CM

## 2021-10-18 DIAGNOSIS — M25511 Pain in right shoulder: Secondary | ICD-10-CM | POA: Diagnosis not present

## 2021-10-18 DIAGNOSIS — M5442 Lumbago with sciatica, left side: Secondary | ICD-10-CM

## 2021-10-18 NOTE — Therapy (Signed)
[facility] [place], [place], [address] Phone: [phone]   Fax:  [phone]  Physical Therapy Treatment  Patient Details  Name: Rhonda Spears MRN: [id number] Date of Birth: September 26, 1934 Referring Provider (PT): Wandra Feinstein MD   Encounter Date: 10/18/2021   PT End of Session - 10/18/21 1529     Visit Number 3    Number of Visits 12    Date for PT Re-Evaluation 01/02/22    Authorization Type FOTO AT LEAST EVERY 5TH VISIT.  PROGRESS NOTE AT 10TH VISIT.  KX MODIFIER AFTER 15 VISITS.    PT Start Time 0230    PT Stop Time 0324    PT Time Calculation (min) 54 min    Activity Tolerance Patient limited by pain    Behavior During Therapy [facility] for tasks assessed/performed             Past Medical History:  Diagnosis Date   Chest pain with moderate risk for cardiac etiology 03/30/2017   CKD (chronic kidney disease), stage III (HCC)    COPD (chronic obstructive pulmonary disease) (HCC)    Gout    Hypertension    PVC's (premature ventricular contractions)    Renal disorder    Sinus bradycardia    Tricuspid regurgitation    Venous insufficiency     Past Surgical History:  Procedure Laterality Date   ABDOMINAL HYSTERECTOMY     arthroscopic left knee     INTRAMEDULLARY (IM) NAIL INTERTROCHANTERIC Right 06/12/2018   Procedure: INTRAMEDULLARY (IM) NAIL INTERTROCHANTRIC;  Surgeon: Hiram Gash, MD;  Location: [place];  Service: Orthopedics;  Laterality: Right;   repair left femoral neck     right medial men      There were no vitals filed for this visit.   Subjective Assessment - 10/18/21 1555     Subjective COVID-19 screen performed prior to patient entering clinic.  oing okay.    Pertinent History OP, COPD, HTN, bilateral hip ORIF, femoral fracture, heart trouble, jaw fracture.    How long can you sit comfortably? Unlimited.    How long can you stand comfortably? 5 minutes.    How long can you walk comfortably?  5 minutes.    Patient Stated Goals reach into microwave with right UE and walk with less pain.    Currently in Pain? Yes    Pain Score 3     Pain Location Shoulder    Pain Orientation Right    Pain Descriptors / Indicators Discomfort    Pain Type Chronic pain    Pain Onset More than a month ago                               [facility] Adult PT Treatment/Exercise - 10/18/21 0001       Modalities   Modalities Electrical Stimulation;Moist Heat      Moist Heat Therapy   Number Minutes Moist Heat 20 Minutes    Moist Heat Location Lumbar Spine      Electrical Stimulation   Electrical Stimulation Location RT anterior shoulder, LB    Electrical Stimulation Action Pre-mod to each region at 80-150 Hz.    Electrical Stimulation Parameters 40% scan x 20 minutes.    Electrical Stimulation Goals Pain      Ultrasound   Ultrasound Location Right anterior shoulder.    Ultrasound Parameters Combo e'stim/US at 1.50 W/CM2 x 12 minutes.    Ultrasound Goals  Pain      Manual Therapy   Manual Therapy Soft tissue mobilization    Soft tissue mobilization STW/M x 11 minutes to patient's right anterior shoulder.                          PT Long Term Goals - 10/04/21 1523       PT LONG TERM GOAL #1   Title Independent with a HEP.    Time 6    Period Weeks    Status New      PT LONG TERM GOAL #2   Title Active right shoulder flexion to 130 degrees so the patient can easily reach overhead.    Time 6    Period Weeks    Status New      PT LONG TERM GOAL #3   Title Stand and walk 15 minutes with low back pain-level not > 3-4/10.    Time 6    Period Weeks    Status New      PT LONG TERM GOAL #4   Title Eliminate posterior thigh symptoms.    Time 6    Period Weeks    Status New                   Plan - 10/18/21 1654     Clinical Impression Statement Patient did well today with treatment with normal modality response following removal of  modalities. Less palpable bicipital tendon pain today.    Personal Factors and Comorbidities Comorbidity 1;Comorbidity 2;Other    Comorbidities OP, COPD, HTN, bilateral hip ORIF, femoral fracture, heart trouble, jaw fracture.    Examination-Activity Limitations Stand;Other;Locomotion Level;Reach Overhead    Examination-Participation Restrictions Other;Meal Prep    Stability/Clinical Decision Making Evolving/Moderate complexity    Rehab Potential Good    PT Frequency 2x / week    PT Duration 6 weeks    PT Treatment/Interventions ADLs/Self Care Home Management;Cryotherapy;Electrical Stimulation;Ultrasound;Moist Heat;Iontophoresis 4mg /ml Dexamethasone;Therapeutic activities;Therapeutic exercise;Manual techniques;Patient/family education;Passive range of motion;Dry needling;Vasopneumatic Device    PT Next Visit Plan Combo e'stim/US to right anterior shoulder, AAROM progressing to low-level strengthening, low-level core exercise progression.  HMP, electrical stimulation.    Consulted and Agree with Plan of Care Patient             Patient will benefit from skilled therapeutic intervention in order to improve the following deficits and impairments:  Pain, Abnormal gait, Decreased activity tolerance, Decreased range of motion, Postural dysfunction, Decreased strength  Visit Diagnosis: Chronic right shoulder pain  Chronic midline low back pain with bilateral sciatica     Problem List Patient Active Problem List   Diagnosis Date Noted   Lumbar compression fracture (HCC) 10/28/2020   Low back pain 10/07/2020   Bronchiectasis (HCC) 02/29/2020   Chronic diastolic CHF (congestive heart failure) (HCC) 02/29/2020   CKD (chronic kidney disease), stage III (HCC) 02/29/2020   Essential hypertension 02/29/2020   Chest pain 02/28/2020   Nodule of right lung 06/30/2019   Closed right hip fracture, initial encounter ([facility]) 06/12/2018   Lower extremity edema 05/29/2018   PVC's (premature  ventricular contractions) 05/09/2018   CKD (chronic kidney disease) stage 3, GFR 30-59 ml/min (HCC) 04/20/2018   HTN (hypertension) 03/30/2017   Atypical chest pain 03/30/2017   Chronic headache disorder 03/30/2017   Osteoarthritis 03/30/2017   Venous insufficiency 03/30/2017   Diverticulosis 03/30/2017   Bigeminy 03/30/2017   DOE (dyspnea on exertion) 03/30/2017   Idiopathic hypotension 03/30/2017  Chest pain with moderate risk for cardiac etiology 03/30/2017   Gout    Renal disorder     Alfreda Hammad, [place], PT 10/18/2021, 4:56 PM  [facility] [address] [place], [place], [address] Phone: [phone]   Fax:  [phone]  Name: Rhonda Spears MRN: [id number] Date of Birth: Aug 05, 1934

## 2021-10-20 ENCOUNTER — Ambulatory Visit: Payer: Medicare Other | Admitting: Physical Therapy

## 2021-10-20 ENCOUNTER — Other Ambulatory Visit: Payer: Self-pay

## 2021-10-20 ENCOUNTER — Encounter: Payer: Self-pay | Admitting: Physical Therapy

## 2021-10-20 DIAGNOSIS — M25511 Pain in right shoulder: Secondary | ICD-10-CM | POA: Diagnosis not present

## 2021-10-20 DIAGNOSIS — R293 Abnormal posture: Secondary | ICD-10-CM

## 2021-10-20 DIAGNOSIS — M5442 Lumbago with sciatica, left side: Secondary | ICD-10-CM

## 2021-10-20 NOTE — Therapy (Signed)
[facility] [place], [place], [address] Phone: [phone]   Fax:  [phone]  Physical Therapy Treatment  Patient Details  Name: Rhonda Spears MRN: [id number] Date of Birth: 07/30/1934 Referring Provider (PT): Wandra Feinstein MD   Encounter Date: 10/20/2021   PT End of Session - 10/20/21 1350     Visit Number 4    Number of Visits 12    Date for PT Re-Evaluation 01/02/22    Authorization Type FOTO AT LEAST EVERY 5TH VISIT.  PROGRESS NOTE AT 10TH VISIT.  KX MODIFIER AFTER 15 VISITS.    PT Start Time 1347    PT Stop Time 1432    PT Time Calculation (min) 45 min    Activity Tolerance Patient limited by pain    Behavior During Therapy [facility] for tasks assessed/performed             Past Medical History:  Diagnosis Date   Chest pain with moderate risk for cardiac etiology 03/30/2017   CKD (chronic kidney disease), stage III (HCC)    COPD (chronic obstructive pulmonary disease) (HCC)    Gout    Hypertension    PVC's (premature ventricular contractions)    Renal disorder    Sinus bradycardia    Tricuspid regurgitation    Venous insufficiency     Past Surgical History:  Procedure Laterality Date   ABDOMINAL HYSTERECTOMY     arthroscopic left knee     INTRAMEDULLARY (IM) NAIL INTERTROCHANTERIC Right 06/12/2018   Procedure: INTRAMEDULLARY (IM) NAIL INTERTROCHANTRIC;  Surgeon: Hiram Gash, MD;  Location: [facility];  Service: Orthopedics;  Laterality: Right;   repair left femoral neck     right medial men      There were no vitals filed for this visit.   Subjective Assessment - 10/20/21 1348     Subjective COVID-19 screen performed prior to patient entering clinic. Most pain if actively reaching wiht RUE.    Pertinent History OP, COPD, HTN, bilateral hip ORIF, femoral fracture, heart trouble, jaw fracture.    How long can you sit comfortably? Unlimited.    How long can you stand comfortably? 5 minutes.    How  long can you walk comfortably? 5 minutes.    Patient Stated Goals reach into microwave with right UE and walk with less pain.    Currently in Pain? Yes    Pain Score 7     Pain Location Shoulder    Pain Orientation Right    Pain Descriptors / Indicators Sharp    Pain Type Chronic pain    Pain Onset More than a month ago    Pain Frequency Intermittent    Aggravating Factors  Reaching, flexion    Pain Score 4    Pain Location Back    Pain Orientation Lower    Pain Descriptors / Indicators Discomfort    Pain Type Chronic pain    Pain Onset More than a month ago    Pain Frequency Constant                [facility] PT Assessment - 10/20/21 0001       Assessment   Medical Diagnosis Right shoulder and lumbar pain.    Referring Provider (PT) Wandra Feinstein MD      Precautions   Precaution Comments OP.                           [facility] Adult  PT Treatment/Exercise - 10/20/21 0001       Lumbar Exercises: Seated   Long Arc Quad on Chair AROM;Both;15 reps    Hip Flexion on Ball AROM;Both;15 reps    Other Seated Lumbar Exercises clam yellow theraband x15 reps      Shoulder Exercises: Seated   Flexion AROM;Right;20 reps    Flexion Limitations short lever    Abduction AROM;Right;20 reps    ABduction Limitations short lever      Shoulder Exercises: ROM/Strengthening   Ranger seated; flex/ext x3 min, CW circles x2 min      Shoulder Exercises: Isometric Strengthening   Flexion 5X5"    Extension 5X5"    External Rotation 5X5"    Internal Rotation 5X5"      Modalities   Modalities Electrical Stimulation;Moist Heat      Moist Heat Therapy   Number Minutes Moist Heat 15 Minutes    Moist Heat Location Lumbar Spine      Electrical Stimulation   Electrical Stimulation Location R shoulder/ B LBP    Electrical Stimulation Action 2 channels: Pre-Mod    Electrical Stimulation Parameters 80-150 hz x15 min    Electrical Stimulation Goals Pain      Ultrasound    Ultrasound Location R anterior shoulder    Ultrasound Parameters Combo 1.5 w/cm2, 100%, 1 mhz x10 min    Ultrasound Goals Pain                          PT Long Term Goals - 10/04/21 1523       PT LONG TERM GOAL #1   Title Independent with a HEP.    Time 6    Period Weeks    Status New      PT LONG TERM GOAL #2   Title Active right shoulder flexion to 130 degrees so the patient can easily reach overhead.    Time 6    Period Weeks    Status New      PT LONG TERM GOAL #3   Title Stand and walk 15 minutes with low back pain-level not > 3-4/10.    Time 6    Period Weeks    Status New      PT LONG TERM GOAL #4   Title Eliminate posterior thigh symptoms.    Time 6    Period Weeks    Status New                   Plan - 10/20/21 1505     Clinical Impression Statement Patient presented in clinic with reports of continued R shoulder pain and LBP. Light ROM and isometric strengthening completed in efforts to improve functional reaching and strength. Light seated exercises with some resistance also initiated for LBP as well with no complaints. Patient reports that she is still fairly tender to palpation of the R bicep tendon. Normal modalities response noted following removal of the modalities.    Personal Factors and Comorbidities Comorbidity 1;Comorbidity 2;Other    Comorbidities OP, COPD, HTN, bilateral hip ORIF, femoral fracture, heart trouble, jaw fracture.    Examination-Activity Limitations Stand;Other;Locomotion Level;Reach Overhead    Examination-Participation Restrictions Other;Meal Prep    Stability/Clinical Decision Making Evolving/Moderate complexity    Rehab Potential Good    PT Frequency 2x / week    PT Duration 6 weeks    PT Treatment/Interventions ADLs/Self Care Home Management;Cryotherapy;Electrical Stimulation;Ultrasound;Moist Heat;Iontophoresis 4mg /ml Dexamethasone;Therapeutic activities;Therapeutic exercise;Manual  techniques;Patient/family education;Passive range  of motion;Dry needling;Vasopneumatic Device    PT Next Visit Plan Combo e'stim/US to right anterior shoulder, AAROM progressing to low-level strengthening, low-level core exercise progression.  HMP, electrical stimulation.    Consulted and Agree with Plan of Care Patient             Patient will benefit from skilled therapeutic intervention in order to improve the following deficits and impairments:  Pain, Abnormal gait, Decreased activity tolerance, Decreased range of motion, Postural dysfunction, Decreased strength  Visit Diagnosis: Chronic right shoulder pain  Chronic midline low back pain with bilateral sciatica  Abnormal posture     Problem List Patient Active Problem List   Diagnosis Date Noted   Lumbar compression fracture (HCC) 10/28/2020   Low back pain 10/07/2020   Bronchiectasis (HCC) 02/29/2020   Chronic diastolic CHF (congestive heart failure) (HCC) 02/29/2020   CKD (chronic kidney disease), stage III (HCC) 02/29/2020   Essential hypertension 02/29/2020   Chest pain 02/28/2020   Nodule of right lung 06/30/2019   Closed right hip fracture, initial encounter (HCC) 06/12/2018   Lower extremity edema 05/29/2018   PVC's (premature ventricular contractions) 05/09/2018   CKD (chronic kidney disease) stage 3, GFR 30-59 ml/min (HCC) 04/20/2018   HTN (hypertension) 03/30/2017   Atypical chest pain 03/30/2017   Chronic headache disorder 03/30/2017   Osteoarthritis 03/30/2017   Venous insufficiency 03/30/2017   Diverticulosis 03/30/2017   Bigeminy 03/30/2017   DOE (dyspnea on exertion) 03/30/2017   Idiopathic hypotension 03/30/2017   Chest pain with moderate risk for cardiac etiology 03/30/2017   Gout    Renal disorder     Marvell Fuller, PTA 10/20/2021, 3:17 PM  [facility] [address] [place], [place], [address] Phone: [phone]   Fax:  [phone]  Name:  Rhonda Spears MRN: [id number] Date of Birth: August 20, 1934

## 2021-10-26 ENCOUNTER — Other Ambulatory Visit: Payer: Self-pay

## 2021-10-26 ENCOUNTER — Ambulatory Visit: Payer: Medicare Other

## 2021-10-26 DIAGNOSIS — G8929 Other chronic pain: Secondary | ICD-10-CM

## 2021-10-26 DIAGNOSIS — M25511 Pain in right shoulder: Secondary | ICD-10-CM | POA: Diagnosis not present

## 2021-10-26 DIAGNOSIS — R293 Abnormal posture: Secondary | ICD-10-CM

## 2021-10-26 NOTE — Therapy (Signed)
[facility] [address] [place], [place], [address] Phone: [phone]   Fax:  [phone]  Physical Therapy Treatment  Patient Details  Name: Rhonda Spears MRN: [id number] Date of Birth: 1934-05-15 Referring Provider (PT): Albertha Ghee MD   Encounter Date: 10/26/2021   PT End of Session - 10/26/21 1346     Visit Number 5    Number of Visits 12    Date for PT Re-Evaluation 01/02/22    Authorization Type FOTO AT LEAST EVERY 5TH VISIT.  PROGRESS NOTE AT 10TH VISIT.  KX MODIFIER AFTER 15 VISITS.    PT Start Time 1345    PT Stop Time 1440    PT Time Calculation (min) 55 min    Activity Tolerance Patient limited by pain    Behavior During Therapy [facility] for tasks assessed/performed             Past Medical History:  Diagnosis Date   Chest pain with moderate risk for cardiac etiology 03/30/2017   CKD (chronic kidney disease), stage III (HCC)    COPD (chronic obstructive pulmonary disease) (HCC)    Gout    Hypertension    PVC's (premature ventricular contractions)    Renal disorder    Sinus bradycardia    Tricuspid regurgitation    Venous insufficiency     Past Surgical History:  Procedure Laterality Date   ABDOMINAL HYSTERECTOMY     arthroscopic left knee     INTRAMEDULLARY (IM) NAIL INTERTROCHANTERIC Right 06/12/2018   Procedure: INTRAMEDULLARY (IM) NAIL INTERTROCHANTRIC;  Surgeon: Bjorn Pippin, MD;  [facility];  Service: Orthopedics;  Laterality: Right;   repair left femoral neck     right medial men      There were no vitals filed for this visit.   Subjective Assessment - 10/26/21 1346     Subjective COVID-19 screen performed prior to patient entering clinic. Patient reports that her right shoulder still hurts quite a bit while reaching. She notes that her left low back is feeling quite a bit better, but the right low back is not responding as well.    Pertinent History OP, COPD, HTN, bilateral hip ORIF,  femoral fracture, heart trouble, jaw fracture.    How long can you sit comfortably? Unlimited.    How long can you stand comfortably? 5 minutes.    How long can you walk comfortably? 5 minutes.    Patient Stated Goals reach into microwave with right UE and walk with less pain.    Currently in Pain? Yes    Pain Score 8     Pain Location Shoulder    Pain Orientation Right    Pain Onset More than a month ago    Pain Score 4    Pain Location Back    Pain Orientation Lower    Pain Onset More than a month ago                               [facility] Adult PT Treatment/Exercise - 10/26/21 0001       Lumbar Exercises: Seated   Long Arc Quad on Chair Strengthening;Both;20 reps      Shoulder Exercises: ROM/Strengthening   Nustep L3 x 8 minutes    Ranger seated; flex/ext x3 min, CW circles x3 min      Shoulder Exercises: Isometric Strengthening   ADduction Other (comment)   seated; 5 second hold; 20 reps  Modalities   Modalities Electrical Stimulation;Moist Heat      Moist Heat Therapy   Number Minutes Moist Heat 20 Minutes    Moist Heat Location Lumbar Spine      Electrical Stimulation   Electrical Stimulation Location right shoulder and right low back    Electrical Stimulation Action 2 channels: premod    Electrical Stimulation Parameters 80-150 Hz x 20 minutes    Electrical Stimulation Goals Pain                          PT Long Term Goals - 10/04/21 1523       PT LONG TERM GOAL #1   Title Independent with a HEP.    Time 6    Period Weeks    Status New      PT LONG TERM GOAL #2   Title Active right shoulder flexion to 130 degrees so the patient can easily reach overhead.    Time 6    Period Weeks    Status New      PT LONG TERM GOAL #3   Title Stand and walk 15 minutes with low back pain-level not > 3-4/10.    Time 6    Period Weeks    Status New      PT LONG TERM GOAL #4   Title Eliminate posterior thigh symptoms.    Time  6    Period Weeks    Status New                   Plan - 10/26/21 1407     Clinical Impression Statement Patient was introduced the nustep and isometric shoulder adduction for improved lumbar and shoulder mobility in addition to rotator cuff engagement. She required minimal cuing with pacing for these interventions to prevent a significant increase in her familiar pain. She experienced a very mild increase in low back discomfort with the nustep today, but this was able to be signficantly reduced with reducing the resistance of this activity. She reported that her back and shoulder felt good upon the conclusion of treatment. She continues to require skilled physical therapy to address her remaining impairments to maximize her functional mobility.    Personal Factors and Comorbidities Comorbidity 1;Comorbidity 2;Other    Comorbidities OP, COPD, HTN, bilateral hip ORIF, femoral fracture, heart trouble, jaw fracture.    Examination-Activity Limitations Stand;Other;Locomotion Level;Reach Overhead    Examination-Participation Restrictions Other;Meal Prep    Stability/Clinical Decision Making Evolving/Moderate complexity    Rehab Potential Good    PT Frequency 2x / week    PT Duration 6 weeks    PT Treatment/Interventions ADLs/Self Care Home Management;Cryotherapy;Electrical Stimulation;Ultrasound;Moist Heat;Iontophoresis 4mg /ml Dexamethasone;Therapeutic activities;Therapeutic exercise;Manual techniques;Patient/family education;Passive range of motion;Dry needling;Vasopneumatic Device    PT Next Visit Plan Combo e'stim/US to right anterior shoulder, AAROM progressing to low-level strengthening, low-level core exercise progression.  HMP, electrical stimulation.    Consulted and Agree with Plan of Care Patient             Patient will benefit from skilled therapeutic intervention in order to improve the following deficits and impairments:  Pain, Abnormal gait, Decreased activity tolerance,  Decreased range of motion, Postural dysfunction, Decreased strength  Visit Diagnosis: Chronic right shoulder pain  Chronic midline low back pain with bilateral sciatica  Abnormal posture     Problem List Patient Active Problem List   Diagnosis Date Noted   Lumbar compression fracture (HCC) 10/28/2020  Low back pain 10/07/2020   Bronchiectasis ([place]) 02/29/2020   Chronic diastolic CHF (congestive heart failure) ([place]) 02/29/2020   CKD (chronic kidney disease), stage III ([place]) 02/29/2020   Essential hypertension 02/29/2020   Chest pain 02/28/2020   Nodule of right lung 06/30/2019   Closed right hip fracture, initial encounter ([place]) 06/12/2018   Lower extremity edema 05/29/2018   PVC's (premature ventricular contractions) 05/09/2018   CKD (chronic kidney disease) stage 3, GFR 30-59 ml/min ([place]) 04/20/2018   HTN (hypertension) 03/30/2017   Atypical chest pain 03/30/2017   Chronic headache disorder 03/30/2017   Osteoarthritis 03/30/2017   Venous insufficiency 03/30/2017   Diverticulosis 03/30/2017   Bigeminy 03/30/2017   DOE (dyspnea on exertion) 03/30/2017   Idiopathic hypotension 03/30/2017   Chest pain with moderate risk for cardiac etiology 03/30/2017   Gout    Renal disorder     Darlin Coco, PT 10/26/2021, 2:44 PM  [facility] [address] [place], [place], [address] Phone: [phone]   Fax:  [phone]  Name: EKATERINA DELAUNE MRN: [id number] Date of Birth: 1934/03/06

## 2021-11-01 ENCOUNTER — Other Ambulatory Visit: Payer: Self-pay

## 2021-11-01 ENCOUNTER — Ambulatory Visit: Payer: Medicare Other | Attending: Sports Medicine | Admitting: *Deleted

## 2021-11-01 DIAGNOSIS — G8929 Other chronic pain: Secondary | ICD-10-CM | POA: Insufficient documentation

## 2021-11-01 DIAGNOSIS — R293 Abnormal posture: Secondary | ICD-10-CM | POA: Diagnosis present

## 2021-11-01 DIAGNOSIS — M5442 Lumbago with sciatica, left side: Secondary | ICD-10-CM | POA: Insufficient documentation

## 2021-11-01 DIAGNOSIS — M25511 Pain in right shoulder: Secondary | ICD-10-CM | POA: Diagnosis present

## 2021-11-01 DIAGNOSIS — M5441 Lumbago with sciatica, right side: Secondary | ICD-10-CM | POA: Diagnosis present

## 2021-11-01 NOTE — Therapy (Signed)
[facility] [place], [place], [address] Phone: [phone]   Fax:  [phone]  Physical Therapy Treatment  Patient Details  Name: Rhonda Spears MRN: [id number] Date of Birth: [birth year] Referring Provider (PT): Wandra Feinstein MD   Encounter Date: 11/01/2021   PT End of Session - 11/01/21 1339     Visit Number 6    Number of Visits 12    Date for PT Re-Evaluation 01/02/22    Authorization Type FOTO AT LEAST EVERY 5TH VISIT.  PROGRESS NOTE AT 10TH VISIT.  KX MODIFIER AFTER 15 VISITS.    PT Start Time 1345    PT Stop Time 1435    PT Time Calculation (min) 50 min             Past Medical History:  Diagnosis Date   Chest pain with moderate risk for cardiac etiology 03/30/2017   CKD (chronic kidney disease), stage III (HCC)    COPD (chronic obstructive pulmonary disease) (HCC)    Gout    Hypertension    PVC's (premature ventricular contractions)    Renal disorder    Sinus bradycardia    Tricuspid regurgitation    Venous insufficiency     Past Surgical History:  Procedure Laterality Date   ABDOMINAL HYSTERECTOMY     arthroscopic left knee     INTRAMEDULLARY (IM) NAIL INTERTROCHANTERIC Right 06/12/2018   Procedure: INTRAMEDULLARY (IM) NAIL INTERTROCHANTRIC;  Surgeon: Hiram Gash, MD;  Location: [facility];  Service: Orthopedics;  Laterality: Right;   repair left femoral neck     right medial men      There were no vitals filed for this visit.                      [facility] Adult PT Treatment/Exercise - 11/01/21 0001       Shoulder Exercises: ROM/Strengthening   Nustep L3 x 10 minutes    Ranger seated; flex/ext x3 min, CW circles x3 min      Modalities   Modalities Electrical Stimulation;Moist Heat      Moist Heat Therapy   Number Minutes Moist Heat 15 Minutes    Moist Heat Location Lumbar Spine      Electrical Stimulation   Electrical Stimulation Location right shoulder and right low  back    Electrical Stimulation Action Premod 2 chs    Electrical Stimulation Parameters 80-150hz x 15 mins    Electrical Stimulation Goals Pain      Manual Therapy   Manual Therapy Passive ROM    Passive ROM AAROM for elevation as well as IR/ER, and isometrics for IR/ER                          PT Long Term Goals - 10/04/21 1523       PT LONG TERM GOAL #1   Title Independent with a HEP.    Time 6    Period Weeks    Status New      PT LONG TERM GOAL #2   Title Active right shoulder flexion to 130 degrees so the patient can easily reach overhead.    Time 6    Period Weeks    Status New      PT LONG TERM GOAL #3   Title Stand and walk 15 minutes with low back pain-level not > 3-4/10.    Time 6    Period Weeks    Status New  PT LONG TERM GOAL #4   Title Eliminate posterior thigh symptoms.    Time 6    Period Weeks    Status New                   Plan - 11/01/21 1340     Clinical Impression Statement Pt arrived today doing better with decreased pain in RT shldr as well as LB. Pt was able to continue with AAROM for Shldr and Drawins were taught for core mm activation and all tolerated well. Decreased pain end of session    Personal Factors and Comorbidities Comorbidity 1;Comorbidity 2;Other    Comorbidities OP, COPD, HTN, bilateral hip ORIF, femoral fracture, heart trouble, jaw fracture.    Examination-Activity Limitations Stand;Other;Locomotion Level;Reach Overhead    Examination-Participation Restrictions Other;Meal Prep    PT Duration 6 weeks    PT Treatment/Interventions ADLs/Self Care Home Management;Cryotherapy;Electrical Stimulation;Ultrasound;Moist Heat;Iontophoresis 4mg /ml Dexamethasone;Therapeutic activities;Therapeutic exercise;Manual techniques;Patient/family education;Passive range of motion;Dry needling;Vasopneumatic Device    PT Next Visit Plan Combo e'stim/US to right anterior shoulder, AAROM progressing to low-level  strengthening, low-level core exercise progression.  HMP, electrical stimulation.             Patient will benefit from skilled therapeutic intervention in order to improve the following deficits and impairments:  Pain, Abnormal gait, Decreased activity tolerance, Decreased range of motion, Postural dysfunction, Decreased strength  Visit Diagnosis: Chronic right shoulder pain  Chronic midline low back pain with bilateral sciatica  Abnormal posture     Problem List Patient Active Problem List   Diagnosis Date Noted   Lumbar compression fracture ([place]) 10/28/2020   Low back pain 10/07/2020   Bronchiectasis ([place]) 02/29/2020   Chronic diastolic CHF (congestive heart failure) ([place]) 02/29/2020   CKD (chronic kidney disease), stage III ([place]) 02/29/2020   Essential hypertension 02/29/2020   Chest pain 02/28/2020   Nodule of right lung 06/30/2019   Closed right hip fracture, initial encounter ([place]) 06/12/2018   Lower extremity edema 05/29/2018   PVC's (premature ventricular contractions) 05/09/2018   CKD (chronic kidney disease) stage 3, GFR 30-59 ml/min ([place]) 04/20/2018   HTN (hypertension) 03/30/2017   Atypical chest pain 03/30/2017   Chronic headache disorder 03/30/2017   Osteoarthritis 03/30/2017   Venous insufficiency 03/30/2017   Diverticulosis 03/30/2017   Bigeminy 03/30/2017   DOE (dyspnea on exertion) 03/30/2017   Idiopathic hypotension 03/30/2017   Chest pain with moderate risk for cardiac etiology 03/30/2017   Gout    Renal disorder     Chairty Toman,CHRIS, PTA 11/01/2021, 4:46 PM  [facility] [address] [place], [place], [address] Phone: [phone]   Fax:  [phone]  Name: Rhonda Spears MRN: [id number] Date of Birth: [birth year]

## 2021-11-01 NOTE — Therapy (Signed)
Rhonda Spears [place], [place], [address] Phone: [phone]   Fax:  [phone]  Physical Therapy Treatment  Patient Details  Name: Rhonda Spears MRN: [id number] Date of Birth: [birth year] Referring Provider (PT): Wandra Feinstein MD   Encounter Date: 11/01/2021   PT End of Session - 11/01/21 1339     Visit Number 6    Number of Visits 12    Date for PT Re-Evaluation 01/02/22    Authorization Type FOTO AT LEAST EVERY 5TH VISIT.  PROGRESS NOTE AT 10TH VISIT.  KX MODIFIER AFTER 15 VISITS.    PT Start Time 1345    PT Stop Time 1435    PT Time Calculation (min) 50 min             Past Medical History:  Diagnosis Date   Chest pain with moderate risk for cardiac etiology 03/30/2017   CKD (chronic kidney disease), stage III (HCC)    COPD (chronic obstructive pulmonary disease) (HCC)    Gout    Hypertension    PVC's (premature ventricular contractions)    Renal disorder    Sinus bradycardia    Tricuspid regurgitation    Venous insufficiency     Past Surgical History:  Procedure Laterality Date   ABDOMINAL HYSTERECTOMY     arthroscopic left knee     INTRAMEDULLARY (IM) NAIL INTERTROCHANTERIC Right 06/12/2018   Procedure: INTRAMEDULLARY (IM) NAIL INTERTROCHANTRIC;  Surgeon: Hiram Gash, MD;  Location: [facility];  Service: Orthopedics;  Laterality: Right;   repair left femoral neck     right medial men      There were no vitals filed for this visit.                      [facility] Adult PT Treatment/Exercise - 11/01/21 0001       Lumbar Exercises: Seated   Other Seated Lumbar Exercises Drawins x 10 hold 5 secs. Cues to not hold her breath      Shoulder Exercises: ROM/Strengthening   Nustep L3 x 10 minutes    Ranger seated; flex/ext x3 min, CW circles x3 min      Modalities   Modalities Electrical Stimulation;Moist Heat      Moist Heat Therapy   Number Minutes Moist Heat 15 Minutes    Moist Heat  Location Lumbar Spine      Electrical Stimulation   Electrical Stimulation Location right shoulder and right low back    Electrical Stimulation Action Premod 2 chs    Electrical Stimulation Parameters 80-150hz x 15 mins    Electrical Stimulation Goals Pain      Manual Therapy   Manual Therapy Passive ROM    Passive ROM AAROM for elevation as well as IR/ER, and isometrics for IR/ER                          PT Long Term Goals - 10/04/21 1523       PT LONG TERM GOAL #1   Title Independent with a HEP.    Time 6    Period Weeks    Status New      PT LONG TERM GOAL #2   Title Active right shoulder flexion to 130 degrees so the patient can easily reach overhead.    Time 6    Period Weeks    Status New      PT LONG TERM GOAL #3   Title Stand  and walk 15 minutes with low back pain-level not > 3-4/10.    Time 6    Period Weeks    Status New      PT LONG TERM GOAL #4   Title Eliminate posterior thigh symptoms.    Time 6    Period Weeks    Status New                   Plan - 11/01/21 1340     Clinical Impression Statement Pt arrived today doing better with decreased pain in RT shldr as well as LB. Pt was able to continue with AAROM for Shldr and Drawins were taught for core mm activation and all tolerated well. Decreased pain end of session    Personal Factors and Comorbidities Comorbidity 1;Comorbidity 2;Other    Comorbidities OP, COPD, HTN, bilateral hip ORIF, femoral fracture, heart trouble, jaw fracture.    Examination-Activity Limitations Stand;Other;Locomotion Level;Reach Overhead    Examination-Participation Restrictions Other;Meal Prep    PT Duration 6 weeks    PT Treatment/Interventions ADLs/Self Care Home Management;Cryotherapy;Electrical Stimulation;Ultrasound;Moist Heat;Iontophoresis 4mg /ml Dexamethasone;Therapeutic activities;Therapeutic exercise;Manual techniques;Patient/family education;Passive range of motion;Dry needling;Vasopneumatic  Device    PT Next Visit Plan Combo e'stim/US to right anterior shoulder, AAROM progressing to low-level strengthening, low-level core exercise progression.  HMP, electrical stimulation.             Patient will benefit from skilled therapeutic intervention in order to improve the following deficits and impairments:  Pain, Abnormal gait, Decreased activity tolerance, Decreased range of motion, Postural dysfunction, Decreased strength  Visit Diagnosis: Chronic right shoulder pain  Chronic midline low back pain with bilateral sciatica  Abnormal posture     Problem List Patient Active Problem List   Diagnosis Date Noted   Lumbar compression fracture ([place]) 10/28/2020   Low back pain 10/07/2020   Bronchiectasis ([place]) 02/29/2020   Chronic diastolic CHF (congestive heart failure) ([place]) 02/29/2020   CKD (chronic kidney disease), stage III ([place]) 02/29/2020   Essential hypertension 02/29/2020   Chest pain 02/28/2020   Nodule of right lung 06/30/2019   Closed right hip fracture, initial encounter ([place]) 06/12/2018   Lower extremity edema 05/29/2018   PVC's (premature ventricular contractions) 05/09/2018   CKD (chronic kidney disease) stage 3, GFR 30-59 ml/min ([place]) 04/20/2018   HTN (hypertension) 03/30/2017   Atypical chest pain 03/30/2017   Chronic headache disorder 03/30/2017   Osteoarthritis 03/30/2017   Venous insufficiency 03/30/2017   Diverticulosis 03/30/2017   Bigeminy 03/30/2017   DOE (dyspnea on exertion) 03/30/2017   Idiopathic hypotension 03/30/2017   Chest pain with moderate risk for cardiac etiology 03/30/2017   Gout    Renal disorder     Mirtha Jain,CHRIS, PTA 11/01/2021, 4:48 PM  [facility] Spe[facility] [address] [place], [place], [address] Phone: [phone]   Fax:  [phone]  Name: Rhonda Spears MRN: [id number] Date of Birth: Dec 29, 1933

## 2021-11-08 ENCOUNTER — Ambulatory Visit: Payer: Medicare Other | Admitting: Physical Therapy

## 2021-11-08 ENCOUNTER — Other Ambulatory Visit: Payer: Self-pay

## 2021-11-08 DIAGNOSIS — R293 Abnormal posture: Secondary | ICD-10-CM

## 2021-11-08 DIAGNOSIS — G8929 Other chronic pain: Secondary | ICD-10-CM

## 2021-11-08 DIAGNOSIS — M5441 Lumbago with sciatica, right side: Secondary | ICD-10-CM

## 2021-11-08 DIAGNOSIS — M25511 Pain in right shoulder: Secondary | ICD-10-CM | POA: Diagnosis not present

## 2021-11-08 NOTE — Therapy (Signed)
[facility] [place], [place], [address] Phone: [phone]   Fax:  [phone]  Physical Therapy Treatment  Patient Details  Name: Rhonda Spears MRN: [id number] Date of Birth: 1934/05/22 Referring Provider (PT): Wandra Feinstein MD   Encounter Date: 11/08/2021   PT End of Session - 11/08/21 1506     Visit Number 7    Number of Visits 12    Authorization Type FOTO AT LEAST EVERY 5TH VISIT.  PROGRESS NOTE AT 10TH VISIT.  KX MODIFIER AFTER 15 VISITS.    PT Start Time 0145    PT Stop Time 0239    PT Time Calculation (min) 54 min    Activity Tolerance Patient limited by pain    Behavior During Therapy [facility] for tasks assessed/performed             Past Medical History:  Diagnosis Date   Chest pain with moderate risk for cardiac etiology 03/30/2017   CKD (chronic kidney disease), stage III (HCC)    COPD (chronic obstructive pulmonary disease) (HCC)    Gout    Hypertension    PVC's (premature ventricular contractions)    Renal disorder    Sinus bradycardia    Tricuspid regurgitation    Venous insufficiency     Past Surgical History:  Procedure Laterality Date   ABDOMINAL HYSTERECTOMY     arthroscopic left knee     INTRAMEDULLARY (IM) NAIL INTERTROCHANTERIC Right 06/12/2018   Procedure: INTRAMEDULLARY (IM) NAIL INTERTROCHANTRIC;  Surgeon: Hiram Gash, MD;  Location: [facility];  Service: Orthopedics;  Laterality: Right;   repair left femoral neck     right medial men      There were no vitals filed for this visit.   Subjective Assessment - 11/08/21 1426     Subjective COVID-19 screen performed prior to patient entering clinic. Rght shoulder hurt more after last treatment. Left side of low back feels good.    Pertinent History OP, COPD, HTN, bilateral hip ORIF, femoral fracture, heart trouble, jaw fracture.    How long can you sit comfortably? Unlimited.    How long can you stand comfortably? 5 minutes.    How  long can you walk comfortably? 5 minutes.    Patient Stated Goals reach into microwave with right UE and walk with less pain.    Currently in Pain? Yes    Pain Score 8     Pain Location Shoulder    Pain Orientation Right    Pain Descriptors / Indicators Sharp    Multiple Pain Sites Yes    Pain Location Back    Pain Orientation Right    Pain Type Chronic pain                               [facility] Adult PT Treatment/Exercise - 11/08/21 0001       Exercises   Exercises Knee/Hip      Lumbar Exercises: Aerobic   Nustep Level 3 x 8 minutes.      Modalities   Modalities Electrical Stimulation;Moist Heat;Ultrasound      Moist Heat Therapy   Number Minutes Moist Heat 20 Minutes    Moist Heat Location Lumbar Spine      Electrical Stimulation   Electrical Stimulation Location Right low back.    Electrical Stimulation Action IFC at 80-150 Hz.    Electrical Stimulation Parameters 40% scan x 20 minutes.    Dealer  Stimulation Goals Pain;Tone      Ultrasound   Ultrasound Location Right anterior shoulder.    Ultrasound Parameters Combo e'stim/US at 1.50 W/CM2 x 8 minutes.      Manual Therapy   Manual Therapy Soft tissue mobilization    Soft tissue mobilization STW/M x 7 minutes to patient's left shoulder                          PT Long Term Goals - 10/04/21 1523       PT LONG TERM GOAL #1   Title Independent with a HEP.    Time 6    Period Weeks    Status New      PT LONG TERM GOAL #2   Title Active right shoulder flexion to 130 degrees so the patient can easily reach overhead.    Time 6    Period Weeks    Status New      PT LONG TERM GOAL #3   Title Stand and walk 15 minutes with low back pain-level not > 3-4/10.    Time 6    Period Weeks    Status New      PT LONG TERM GOAL #4   Title Eliminate posterior thigh symptoms.    Time 6    Period Weeks    Status New                   Plan - 11/08/21 1501      Clinical Impression Statement Conservative treatment today due to c/o right shoulder pain.  She did very well with treatment feeling much better and stating:  "If only I could feel this good everyday."    Personal Factors and Comorbidities Comorbidity 1;Comorbidity 2;Other    Comorbidities OP, COPD, HTN, bilateral hip ORIF, femoral fracture, heart trouble, jaw fracture.    Examination-Activity Limitations Stand;Other;Locomotion Level;Reach Overhead    Examination-Participation Restrictions Other;Meal Prep    Stability/Clinical Decision Making Evolving/Moderate complexity    Rehab Potential Good    PT Frequency 2x / week    PT Duration 6 weeks    PT Treatment/Interventions ADLs/Self Care Home Management;Cryotherapy;Electrical Stimulation;Ultrasound;Moist Heat;Iontophoresis 4mg /ml Dexamethasone;Therapeutic activities;Therapeutic exercise;Manual techniques;Patient/family education;Passive range of motion;Dry needling;Vasopneumatic Device    PT Next Visit Plan Combo e'stim/US to right anterior shoulder, AAROM progressing to low-level strengthening, low-level core exercise progression.  HMP, electrical stimulation.    Consulted and Agree with Plan of Care Patient             Patient will benefit from skilled therapeutic intervention in order to improve the following deficits and impairments:  Pain, Abnormal gait, Decreased activity tolerance, Decreased range of motion, Postural dysfunction, Decreased strength  Visit Diagnosis: Chronic right shoulder pain  Chronic midline low back pain with bilateral sciatica  Abnormal posture     Problem List Patient Active Problem List   Diagnosis Date Noted   Lumbar compression fracture (HCC) 10/28/2020   Low back pain 10/07/2020   Bronchiectasis (HCC) 02/29/2020   Chronic diastolic CHF (congestive heart failure) (HCC) 02/29/2020   CKD (chronic kidney disease), stage III (HCC) 02/29/2020   Essential hypertension 02/29/2020   Chest pain 02/28/2020    Nodule of right lung 06/30/2019   Closed right hip fracture, initial encounter (HCC) 06/12/2018   Lower extremity edema 05/29/2018   PVC's (premature ventricular contractions) 05/09/2018   CKD (chronic kidney disease) stage 3, GFR 30-59 ml/min (HCC) 04/20/2018   HTN (hypertension) 03/30/2017  Atypical chest pain 03/30/2017   Chronic headache disorder 03/30/2017   Osteoarthritis 03/30/2017   Venous insufficiency 03/30/2017   Diverticulosis 03/30/2017   Bigeminy 03/30/2017   DOE (dyspnea on exertion) 03/30/2017   Idiopathic hypotension 03/30/2017   Chest pain with moderate risk for cardiac etiology 03/30/2017   Gout    Renal disorder     Kevina Piloto, [place], PT 11/08/2021, 3:07 PM  [facility] [address] [place], [place], [address] Phone: [phone]   Fax:  [phone]  Name: METZI DHANANI MRN: [id number] Date of Birth: 31-Jul-1934

## 2021-11-15 ENCOUNTER — Other Ambulatory Visit: Payer: Self-pay

## 2021-11-15 ENCOUNTER — Ambulatory Visit: Payer: Medicare Other | Admitting: *Deleted

## 2021-11-15 DIAGNOSIS — M25511 Pain in right shoulder: Secondary | ICD-10-CM

## 2021-11-15 DIAGNOSIS — R293 Abnormal posture: Secondary | ICD-10-CM

## 2021-11-15 DIAGNOSIS — G8929 Other chronic pain: Secondary | ICD-10-CM

## 2021-11-15 NOTE — Therapy (Signed)
[facility] [place], [place], [address] Phone: [phone]   Fax:  [phone]  Physical Therapy Treatment  Patient Details  Name: Rhonda Spears MRN: [id number] Date of Birth: [birth year] Referring Provider (PT): Wandra Feinstein MD   Encounter Date: 11/15/2021   PT End of Session - 11/15/21 1622     Visit Number 8    Number of Visits 12    Date for PT Re-Evaluation 01/02/22    Authorization Type FOTO AT LEAST EVERY 5TH VISIT.  PROGRESS NOTE AT 10TH VISIT.  KX MODIFIER AFTER 15 VISITS.    PT Start Time 1345    PT Stop Time 1435    PT Time Calculation (min) 50 min             Past Medical History:  Diagnosis Date   Chest pain with moderate risk for cardiac etiology 03/30/2017   CKD (chronic kidney disease), stage III (HCC)    COPD (chronic obstructive pulmonary disease) (HCC)    Gout    Hypertension    PVC's (premature ventricular contractions)    Renal disorder    Sinus bradycardia    Tricuspid regurgitation    Venous insufficiency     Past Surgical History:  Procedure Laterality Date   ABDOMINAL HYSTERECTOMY     arthroscopic left knee     INTRAMEDULLARY (IM) NAIL INTERTROCHANTERIC Right 06/12/2018   Procedure: INTRAMEDULLARY (IM) NAIL INTERTROCHANTRIC;  Surgeon: Hiram Gash, MD;  Location: [facility];  Service: Orthopedics;  Laterality: Right;   repair left femoral neck     right medial men      There were no vitals filed for this visit.   Subjective Assessment - 11/15/21 1345     Subjective COVID-19 screen performed prior to patient entering clinic. RT shldr pain 4/10, LB6/10 due to the weather    Pertinent History OP, COPD, HTN, bilateral hip ORIF, femoral fracture, heart trouble, jaw fracture.    How long can you sit comfortably? Unlimited.    How long can you stand comfortably? 5 minutes.    How long can you walk comfortably? 5 minutes.    Patient Stated Goals reach into microwave with right UE and  walk with less pain.    Pain Score 6     Pain Location Shoulder    Pain Orientation Right    Pain Descriptors / Indicators Sharp    Pain Type Chronic pain    Pain Onset More than a month ago    Pain Location Shoulder    Pain Orientation Right    Pain Descriptors / Indicators Discomfort    Pain Type Chronic pain    Pain Onset More than a month ago                               [facility] Adult PT Treatment/Exercise - 11/15/21 0001       Exercises   Exercises Shoulder;Knee/Hip      Shoulder Exercises: Pulleys   Flexion 5 minutes   4 mins     Shoulder Exercises: ROM/Strengthening   Ranger seated; flex/ext x4 min, CW circles x69min      Modalities   Modalities Electrical Stimulation;Moist Heat;Ultrasound      Moist Heat Therapy   Number Minutes Moist Heat 15 Minutes    Moist Heat Location Lumbar Spine      Electrical Stimulation   Electrical Stimulation Location Right shldr,  low back.  Electrical Stimulation Action Premod 2 chs  at 80-150hz  x 15 mins    Electrical Stimulation Goals Pain;Tone      Manual Therapy   Manual Therapy Soft tissue mobilization    Soft tissue mobilization STW/M x 67minutes to patient's left shoulder                          PT Long Term Goals - 10/04/21 1523       PT LONG TERM GOAL #1   Title Independent with a HEP.    Time 6    Period Weeks    Status New      PT LONG TERM GOAL #2   Title Active right shoulder flexion to 130 degrees so the patient can easily reach overhead.    Time 6    Period Weeks    Status New      PT LONG TERM GOAL #3   Title Stand and walk 15 minutes with low back pain-level not > 3-4/10.    Time 6    Period Weeks    Status New      PT LONG TERM GOAL #4   Title Eliminate posterior thigh symptoms.    Time 6    Period Weeks    Status New                   Plan - 11/15/21 1623     Personal Factors and Comorbidities Comorbidity 1;Comorbidity 2;Other     Comorbidities OP, COPD, HTN, bilateral hip ORIF, femoral fracture, heart trouble, jaw fracture.    Examination-Activity Limitations Stand;Other;Locomotion Level;Reach Overhead    Stability/Clinical Decision Making Evolving/Moderate complexity    Rehab Potential Good    PT Frequency 2x / week    PT Duration 6 weeks    PT Treatment/Interventions ADLs/Self Care Home Management;Cryotherapy;Electrical Stimulation;Ultrasound;Moist Heat;Iontophoresis 4mg /ml Dexamethasone;Therapeutic activities;Therapeutic exercise;Manual techniques;Patient/family education;Passive range of motion;Dry needling;Vasopneumatic Device    PT Next Visit Plan Combo e'stim/US to right anterior shoulder, AAROM progressing to low-level strengthening, low-level core exercise progression.  HMP, electrical stimulation.             Patient will benefit from skilled therapeutic intervention in order to improve the following deficits and impairments:  Pain, Abnormal gait, Decreased activity tolerance, Decreased range of motion, Postural dysfunction, Decreased strength  Visit Diagnosis: Chronic right shoulder pain  Chronic midline low back pain with bilateral sciatica  Abnormal posture     Problem List Patient Active Problem List   Diagnosis Date Noted   Lumbar compression fracture ([place]) 10/28/2020   Low back pain 10/07/2020   Bronchiectasis ([place]) 02/29/2020   Chronic diastolic CHF (congestive heart failure) ([place]) 02/29/2020   CKD (chronic kidney disease), stage III ([place]) 02/29/2020   Essential hypertension 02/29/2020   Chest pain 02/28/2020   Nodule of right lung 06/30/2019   Closed right hip fracture, initial encounter ([place]) 06/12/2018   Lower extremity edema 05/29/2018   PVC's (premature ventricular contractions) 05/09/2018   CKD (chronic kidney disease) stage 3, GFR 30-59 ml/min ([place]) 04/20/2018   HTN (hypertension) 03/30/2017   Atypical chest pain 03/30/2017   Chronic headache disorder 03/30/2017    Osteoarthritis 03/30/2017   Venous insufficiency 03/30/2017   Diverticulosis 03/30/2017   Bigeminy 03/30/2017   DOE (dyspnea on exertion) 03/30/2017   Idiopathic hypotension 03/30/2017   Chest pain with moderate risk for cardiac etiology 03/30/2017   Gout    Renal disorder     Nathalia Wismer,CHRIS, PTA  11/15/2021, 4:26 PM  [facility] [place], [place], [address] Phone: [phone]   Fax:  [phone]  Name: Rhonda Spears MRN: [id number] Date of Birth: 11-20-1933

## 2021-11-22 ENCOUNTER — Ambulatory Visit: Payer: Medicare Other

## 2021-11-22 ENCOUNTER — Other Ambulatory Visit: Payer: Self-pay

## 2021-11-22 DIAGNOSIS — M25511 Pain in right shoulder: Secondary | ICD-10-CM | POA: Diagnosis not present

## 2021-11-22 DIAGNOSIS — G8929 Other chronic pain: Secondary | ICD-10-CM

## 2021-11-22 NOTE — Therapy (Signed)
[facility] [place], [place], [address] Phone: [phone]   Fax:  [phone]  Physical Therapy Treatment  Patient Details  Name: Rhonda Spears MRN: [id number] Date of Birth: 08/18/1934 Referring Provider (PT): Wandra Feinstein MD   Encounter Date: 11/22/2021   PT End of Session - 11/22/21 1347     Visit Number 9    Number of Visits 12    Date for PT Re-Evaluation 01/02/22    Authorization Type FOTO AT LEAST EVERY 5TH VISIT.  PROGRESS NOTE AT 10TH VISIT.  KX MODIFIER AFTER 15 VISITS.    PT Start Time 1345    PT Stop Time 1432    PT Time Calculation (min) 47 min             Past Medical History:  Diagnosis Date   Chest pain with moderate risk for cardiac etiology 03/30/2017   CKD (chronic kidney disease), stage III (HCC)    COPD (chronic obstructive pulmonary disease) (HCC)    Gout    Hypertension    PVC's (premature ventricular contractions)    Renal disorder    Sinus bradycardia    Tricuspid regurgitation    Venous insufficiency     Past Surgical History:  Procedure Laterality Date   ABDOMINAL HYSTERECTOMY     arthroscopic left knee     INTRAMEDULLARY (IM) NAIL INTERTROCHANTERIC Right 06/12/2018   Procedure: INTRAMEDULLARY (IM) NAIL INTERTROCHANTRIC;  Surgeon: Hiram Gash, MD;  Location: [facility];  Service: Orthopedics;  Laterality: Right;   repair left femoral neck     right medial men      There were no vitals filed for this visit.   Subjective Assessment - 11/22/21 1350     Subjective COVID-19 screen performed prior to patient entering clinic. Pt arrives for today's treatment session reporting 3/10 low back pain.  Pt states that shoulder only hurts when she is attempting to lift anything.    Pertinent History OP, COPD, HTN, bilateral hip ORIF, femoral fracture, heart trouble, jaw fracture.    How long can you sit comfortably? Unlimited.    How long can you stand comfortably? 5 minutes.    How long  can you walk comfortably? 5 minutes.    Patient Stated Goals reach into microwave with right UE and walk with less pain.    Currently in Pain? Yes    Pain Score 3     Pain Location Back    Pain Onset More than a month ago    Pain Onset More than a month ago                               [facility] Adult PT Treatment/Exercise - 11/22/21 0001       Shoulder Exercises: ROM/Strengthening   Nustep Lvl 3 x 15 mins      Modalities   Modalities Electrical Stimulation;Moist Heat;Ultrasound      Moist Heat Therapy   Number Minutes Moist Heat 15 Minutes    Moist Heat Location Lumbar Spine      Electrical Stimulation   Electrical Stimulation Location Right low back/SI region    Electrical Stimulation Action IFC at 80-150 Hz    Electrical Stimulation Parameters 15 mins    Electrical Stimulation Goals Pain;Tone      Manual Therapy   Manual Therapy Soft tissue mobilization    Soft tissue mobilization STW/M performed to right low back/SI region to decrease  pain and tone                          PT Long Term Goals - 10/04/21 1523       PT LONG TERM GOAL #1   Title Independent with a HEP.    Time 6    Period Weeks    Status New      PT LONG TERM GOAL #2   Title Active right shoulder flexion to 130 degrees so the patient can easily reach overhead.    Time 6    Period Weeks    Status New      PT LONG TERM GOAL #3   Title Stand and walk 15 minutes with low back pain-level not > 3-4/10.    Time 6    Period Weeks    Status New      PT LONG TERM GOAL #4   Title Eliminate posterior thigh symptoms.    Time 6    Period Weeks    Status New                   Plan - 11/22/21 1348     Clinical Impression Statement Pt arrives for today's treamtent session denying any shoulder pain, but reporting 3/10 low back pain.  Pt states that her shoulder only hurts when she attempts to lift anything.  STW/M performed to right lower SI joint region with  pt in left side-lying to decrease pain and tone.  Normal responses to estim and moist heat at completion of session.  Pt reported 1/10 low back pain at completion of today's treatment session.    Personal Factors and Comorbidities Comorbidity 1;Comorbidity 2;Other    Comorbidities OP, COPD, HTN, bilateral hip ORIF, femoral fracture, heart trouble, jaw fracture.    Examination-Activity Limitations Stand;Other;Locomotion Level;Reach Overhead    Stability/Clinical Decision Making Evolving/Moderate complexity    Rehab Potential Good    PT Frequency 2x / week    PT Duration 6 weeks    PT Treatment/Interventions ADLs/Self Care Home Management;Cryotherapy;Electrical Stimulation;Ultrasound;Moist Heat;Iontophoresis 4mg /ml Dexamethasone;Therapeutic activities;Therapeutic exercise;Manual techniques;Patient/family education;Passive range of motion;Dry needling;Vasopneumatic Device    PT Next Visit Plan Combo e'stim/US to right anterior shoulder, AAROM progressing to low-level strengthening, low-level core exercise progression.  HMP, electrical stimulation.             Patient will benefit from skilled therapeutic intervention in order to improve the following deficits and impairments:  Pain, Abnormal gait, Decreased activity tolerance, Decreased range of motion, Postural dysfunction, Decreased strength  Visit Diagnosis: Chronic right shoulder pain     Problem List Patient Active Problem List   Diagnosis Date Noted   Lumbar compression fracture ([place]) 10/28/2020   Low back pain 10/07/2020   Bronchiectasis ([place]) 02/29/2020   Chronic diastolic CHF (congestive heart failure) ([place]) 02/29/2020   CKD (chronic kidney disease), stage III ([place]) 02/29/2020   Essential hypertension 02/29/2020   Chest pain 02/28/2020   Nodule of right lung 06/30/2019   Closed right hip fracture, initial encounter ([place]) 06/12/2018   Lower extremity edema 05/29/2018   PVC's (premature ventricular contractions) 05/09/2018    CKD (chronic kidney disease) stage 3, GFR 30-59 ml/min ([place]) 04/20/2018   HTN (hypertension) 03/30/2017   Atypical chest pain 03/30/2017   Chronic headache disorder 03/30/2017   Osteoarthritis 03/30/2017   Venous insufficiency 03/30/2017   Diverticulosis 03/30/2017   Bigeminy 03/30/2017   DOE (dyspnea on exertion) 03/30/2017   Idiopathic hypotension 03/30/2017  Chest pain with moderate risk for cardiac etiology 03/30/2017   Gout    Renal disorder     Kathrynn Ducking, PTA 11/22/2021, 2:37 PM  [facility] [place], [place], [address] Phone: [phone]   Fax:  [phone]  Name: Rhonda Spears MRN: [id number] Date of Birth: 12-29-1933

## 2021-11-29 ENCOUNTER — Encounter: Payer: Self-pay | Admitting: Physical Therapy

## 2021-11-29 ENCOUNTER — Other Ambulatory Visit: Payer: Self-pay

## 2021-11-29 ENCOUNTER — Ambulatory Visit: Payer: Medicare Other | Admitting: Physical Therapy

## 2021-11-29 DIAGNOSIS — M25511 Pain in right shoulder: Secondary | ICD-10-CM | POA: Diagnosis not present

## 2021-11-29 DIAGNOSIS — R293 Abnormal posture: Secondary | ICD-10-CM

## 2021-11-29 DIAGNOSIS — G8929 Other chronic pain: Secondary | ICD-10-CM

## 2021-11-29 NOTE — Therapy (Signed)
[facility] [place], [place], [address] Phone: [phone]   Fax:  [phone]  Physical Therapy Treatment  Patient Details  Name: Rhonda Spears MRN: [id number] Date of Birth: 1934-06-28 Referring Provider (PT): Wandra Feinstein MD   Encounter Date: 11/29/2021   PT End of Session - 11/29/21 1610     Visit Number 10    Number of Visits 12    Date for PT Re-Evaluation 01/02/22    Authorization Type FOTO AT LEAST EVERY 5TH VISIT.  PROGRESS NOTE AT 10TH VISIT.  KX MODIFIER AFTER 15 VISITS.    PT Start Time 1431    PT Stop Time 1519    PT Time Calculation (min) 48 min    Activity Tolerance Patient limited by pain    Behavior During Therapy [facility] for tasks assessed/performed             Past Medical History:  Diagnosis Date   Chest pain with moderate risk for cardiac etiology 03/30/2017   CKD (chronic kidney disease), stage III (HCC)    COPD (chronic obstructive pulmonary disease) (HCC)    Gout    Hypertension    PVC's (premature ventricular contractions)    Renal disorder    Sinus bradycardia    Tricuspid regurgitation    Venous insufficiency     Past Surgical History:  Procedure Laterality Date   ABDOMINAL HYSTERECTOMY     arthroscopic left knee     INTRAMEDULLARY (IM) NAIL INTERTROCHANTERIC Right 06/12/2018   Procedure: INTRAMEDULLARY (IM) NAIL INTERTROCHANTRIC;  Surgeon: Hiram Gash, MD;  Location: [facility];  Service: Orthopedics;  Laterality: Right;   repair left femoral neck     right medial men      There were no vitals filed for this visit.   Subjective Assessment - 11/29/21 1500     Subjective COVID-19 screen performed prior to patient entering clinic. Can notice a big difference but still cannot lift anything with RUE.    Pertinent History OP, COPD, HTN, bilateral hip ORIF, femoral fracture, heart trouble, jaw fracture.    How long can you sit comfortably? Unlimited.    How long can you stand  comfortably? 5 minutes.    How long can you walk comfortably? 5 minutes.    Patient Stated Goals reach into microwave with right UE and walk with less pain.    Currently in Pain? No/denies                               [facility] Adult PT Treatment/Exercise - 11/29/21 0001       Lumbar Exercises: Seated   Long Arc Quad on Chair AROM;Both;20 reps;Limitations    LAQ on Chair Limitations core stabilization    Hip Flexion on Ball AROM;Both;20 reps;Limitations    Hip Flexion on Ball Limitations with core stabilizations    Other Seated Lumbar Exercises clam yellow theraband x15 reps      Shoulder Exercises: Seated   Row Strengthening;Right;20 reps;Theraband    Theraband Level (Shoulder Row) Level 1 (Yellow)    External Rotation AROM;Both;20 reps      Shoulder Exercises: ROM/Strengthening   Nustep Lvl 3 x 15 mins    Wall Wash into flex x10 reps      Modalities   Modalities Electrical Stimulation;Moist Heat;Ultrasound      Moist Heat Therapy   Number Minutes Moist Heat 15 Minutes    Moist Heat Location Lumbar Spine;Shoulder  Acupuncturist Location R shoulder, LB    Electrical Stimulation Action IFC    Electrical Stimulation Parameters 80-150 hz x15 min    Electrical Stimulation Goals Pain                          PT Long Term Goals - 10/04/21 1523       PT LONG TERM GOAL #1   Title Independent with a HEP.    Time 6    Period Weeks    Status New      PT LONG TERM GOAL #2   Title Active right shoulder flexion to 130 degrees so the patient can easily reach overhead.    Time 6    Period Weeks    Status New      PT LONG TERM GOAL #3   Title Stand and walk 15 minutes with low back pain-level not > 3-4/10.    Time 6    Period Weeks    Status New      PT LONG TERM GOAL #4   Title Eliminate posterior thigh symptoms.    Time 6    Period Weeks    Status New                   Plan -  11/29/21 1520     Clinical Impression Statement Patient presented in clinic today with no pain. Patient still unable to lift with RUE due to pain but able to complete moderate reps of wall slides before feeling it "locking." Patient able to tolerate light strengthening and functional ROM exercises but R shoulder fatigued. Core stabilization provided with horixontal abduction at shoulder height with seated lumbar exercises. Normal modalities response noted following removal of the modalities.    Personal Factors and Comorbidities Comorbidity 1;Comorbidity 2;Other    Comorbidities OP, COPD, HTN, bilateral hip ORIF, femoral fracture, heart trouble, jaw fracture.    Examination-Activity Limitations Stand;Other;Locomotion Level;Reach Overhead    Examination-Participation Restrictions Other;Meal Prep    Stability/Clinical Decision Making Evolving/Moderate complexity    Rehab Potential Good    PT Frequency 2x / week    PT Duration 6 weeks    PT Treatment/Interventions ADLs/Self Care Home Management;Cryotherapy;Electrical Stimulation;Ultrasound;Moist Heat;Iontophoresis 4mg /ml Dexamethasone;Therapeutic activities;Therapeutic exercise;Manual techniques;Patient/family education;Passive range of motion;Dry needling;Vasopneumatic Device    PT Next Visit Plan Combo e'stim/US to right anterior shoulder, AAROM progressing to low-level strengthening, low-level core exercise progression.  HMP, electrical stimulation.    Consulted and Agree with Plan of Care Patient             Patient will benefit from skilled therapeutic intervention in order to improve the following deficits and impairments:  Pain, Abnormal gait, Decreased activity tolerance, Decreased range of motion, Postural dysfunction, Decreased strength  Visit Diagnosis: Chronic right shoulder pain  Chronic midline low back pain with bilateral sciatica  Abnormal posture     Problem List Patient Active Problem List   Diagnosis Date Noted    Lumbar compression fracture ([place]) 10/28/2020   Low back pain 10/07/2020   Bronchiectasis ([place]) 02/29/2020   Chronic diastolic CHF (congestive heart failure) ([place]) 02/29/2020   CKD (chronic kidney disease), stage III ([place]) 02/29/2020   Essential hypertension 02/29/2020   Chest pain 02/28/2020   Nodule of right lung 06/30/2019   Closed right hip fracture, initial encounter ([place]) 06/12/2018   Lower extremity edema 05/29/2018   PVC's (premature ventricular contractions) 05/09/2018   CKD (chronic kidney  disease) stage 3, GFR 30-59 ml/min (HCC) 04/20/2018   HTN (hypertension) 03/30/2017   Atypical chest pain 03/30/2017   Chronic headache disorder 03/30/2017   Osteoarthritis 03/30/2017   Venous insufficiency 03/30/2017   Diverticulosis 03/30/2017   Bigeminy 03/30/2017   DOE (dyspnea on exertion) 03/30/2017   Idiopathic hypotension 03/30/2017   Chest pain with moderate risk for cardiac etiology 03/30/2017   Gout    Renal disorder     Standley Brooking, PTA 11/29/2021, 4:13 PM  [facility] [address] [place], [place], [address] Phone: [phone]   Fax:  [phone]  Name: Rhonda Spears MRN: [id number] Date of Birth: 01-26-1934

## 2021-12-06 ENCOUNTER — Encounter: Payer: Self-pay | Admitting: Physical Therapy

## 2021-12-06 ENCOUNTER — Other Ambulatory Visit: Payer: Self-pay

## 2021-12-06 ENCOUNTER — Ambulatory Visit: Payer: Medicare Other | Attending: Sports Medicine | Admitting: Physical Therapy

## 2021-12-06 DIAGNOSIS — G8929 Other chronic pain: Secondary | ICD-10-CM | POA: Insufficient documentation

## 2021-12-06 DIAGNOSIS — R293 Abnormal posture: Secondary | ICD-10-CM | POA: Insufficient documentation

## 2021-12-06 DIAGNOSIS — M5441 Lumbago with sciatica, right side: Secondary | ICD-10-CM | POA: Insufficient documentation

## 2021-12-06 DIAGNOSIS — M25511 Pain in right shoulder: Secondary | ICD-10-CM | POA: Insufficient documentation

## 2021-12-06 DIAGNOSIS — M5442 Lumbago with sciatica, left side: Secondary | ICD-10-CM | POA: Diagnosis present

## 2021-12-06 NOTE — Therapy (Signed)
[facility] [place], [place], [address] Phone: [phone]   Fax:  [phone]  Physical Therapy Treatment  Patient Details  Name: Rhonda Spears MRN: [id number] Date of Birth: [birth year] Referring Provider (PT): Wandra Feinstein MD   Encounter Date: 12/06/2021   PT End of Session - 12/06/21 1353     Visit Number 11    Number of Visits 12    Date for PT Re-Evaluation 01/02/22    Authorization Type FOTO AT LEAST EVERY 5TH VISIT.  PROGRESS NOTE AT 10TH VISIT.  KX MODIFIER AFTER 15 VISITS.    PT Start Time 1348    PT Stop Time 1433    PT Time Calculation (min) 45 min    Activity Tolerance Patient limited by pain    Behavior During Therapy [facility] for tasks assessed/performed             Past Medical History:  Diagnosis Date   Chest pain with moderate risk for cardiac etiology 03/30/2017   CKD (chronic kidney disease), stage III (HCC)    COPD (chronic obstructive pulmonary disease) (HCC)    Gout    Hypertension    PVC's (premature ventricular contractions)    Renal disorder    Sinus bradycardia    Tricuspid regurgitation    Venous insufficiency     Past Surgical History:  Procedure Laterality Date   ABDOMINAL HYSTERECTOMY     arthroscopic left knee     INTRAMEDULLARY (IM) NAIL INTERTROCHANTERIC Right 06/12/2018   Procedure: INTRAMEDULLARY (IM) NAIL INTERTROCHANTRIC;  Surgeon: Hiram Gash, MD;  Location: [facility];  Service: Orthopedics;  Laterality: Right;   repair left femoral neck     right medial men      There were no vitals filed for this visit.   Subjective Assessment - 12/06/21 1349     Subjective COVID-19 screen performed prior to patient entering clinic. Reports that she is sore along R ribs and shoulder and she fell while leaving a funeral over the weekend. Patient took her cane with her to the cemetary and thinks that possibly she tripped over the end of the cane. Patient did not go to get xrayed or  checked by medical staff but thinks she is only bruises. Has some discomfort with use of RUE. Has had LBP in R side since fall and lower than normal. "I think it just jarred me."    Pertinent History OP, COPD, HTN, bilateral hip ORIF, femoral fracture, heart trouble, jaw fracture.    How long can you sit comfortably? Unlimited.    How long can you stand comfortably? 5 minutes.    How long can you walk comfortably? 5 minutes.    Patient Stated Goals reach into microwave with right UE and walk with less pain.    Currently in Pain? Yes    Pain Score 5     Pain Location Shoulder    Pain Orientation Right    Pain Descriptors / Indicators Discomfort;Sore    Pain Type Acute pain    Pain Onset In the past 7 days    Pain Frequency Constant    Pain Score 5    Pain Location Back    Pain Orientation Right;Lower    Pain Descriptors / Indicators Discomfort    Pain Type Acute pain    Pain Onset In the past 7 days    Pain Frequency Constant                [facility]  PT Assessment - 12/06/21 0001       Assessment   Medical Diagnosis Right shoulder and lumbar pain.    Referring Provider (PT) Wandra Feinstein MD      Precautions   Precaution Comments OP.                           [facility] Adult PT Treatment/Exercise - 12/06/21 0001       Shoulder Exercises: ROM/Strengthening   UBE (Upper Arm Bike) 120 RPM x6 min (forward/backward)    Nustep Lvl 1 x 10 mins    [place] seated; flex/ext, CW circles      Modalities   Modalities Electrical Stimulation;Moist Heat;Ultrasound      Moist Heat Therapy   Number Minutes Moist Heat 15 Minutes    Moist Heat Location Lumbar Spine;Shoulder      Electrical Stimulation   Electrical Stimulation Location R shoulder, LB    Electrical Stimulation Action 2 channels: pre-mod    Electrical Stimulation Parameters 80-150 hz x15 min    Electrical Stimulation Goals Pain                          PT Long Term Goals - 10/04/21 1523        PT LONG TERM GOAL #1   Title Independent with a HEP.    Time 6    Period Weeks    Status New      PT LONG TERM GOAL #2   Title Active right shoulder flexion to 130 degrees so the patient can easily reach overhead.    Time 6    Period Weeks    Status New      PT LONG TERM GOAL #3   Title Stand and walk 15 minutes with low back pain-level not > 3-4/10.    Time 6    Period Weeks    Status New      PT LONG TERM GOAL #4   Title Eliminate posterior thigh symptoms.    Time 6    Period Weeks    Status New                   Plan - 12/06/21 1426     Clinical Impression Statement Patient presented in clinic with reports of a fall over the weekend in which she tripped over her cane when leaving a graveside service. Patient feels bruised and soreness in R ribs and shoulder and well as R low back. Limited therex today to only light resisted and ROM exercises to avoid flare up of already present pain. Patient provided handout for TENS unit as she is interested in it for home use long term. Normal modalities response noted following removal of the modalities.    Personal Factors and Comorbidities Comorbidity 1;Comorbidity 2;Other    Comorbidities OP, COPD, HTN, bilateral hip ORIF, femoral fracture, heart trouble, jaw fracture.    Examination-Activity Limitations Stand;Other;Locomotion Level;Reach Overhead    Examination-Participation Restrictions Other;Meal Prep    Stability/Clinical Decision Making Evolving/Moderate complexity    Rehab Potential Good    PT Frequency 2x / week    PT Duration 6 weeks    PT Treatment/Interventions ADLs/Self Care Home Management;Cryotherapy;Electrical Stimulation;Ultrasound;Moist Heat;Iontophoresis 4mg /ml Dexamethasone;Therapeutic activities;Therapeutic exercise;Manual techniques;Patient/family education;Passive range of motion;Dry needling;Vasopneumatic Device    PT Next Visit Plan Combo e'stim/US to right anterior shoulder, AAROM progressing to  low-level strengthening, low-level core exercise progression.  HMP, electrical stimulation.  Consulted and Agree with Plan of Care Patient             Patient will benefit from skilled therapeutic intervention in order to improve the following deficits and impairments:  Pain, Abnormal gait, Decreased activity tolerance, Decreased range of motion, Postural dysfunction, Decreased strength  Visit Diagnosis: Chronic right shoulder pain  Chronic midline low back pain with bilateral sciatica  Abnormal posture     Problem List Patient Active Problem List   Diagnosis Date Noted   Lumbar compression fracture ([place]) 10/28/2020   Low back pain 10/07/2020   Bronchiectasis ([place]) 02/29/2020   Chronic diastolic CHF (congestive heart failure) ([place]) 02/29/2020   CKD (chronic kidney disease), stage III ([place]) 02/29/2020   Essential hypertension 02/29/2020   Chest pain 02/28/2020   Nodule of right lung 06/30/2019   Closed right hip fracture, initial encounter ([place]) 06/12/2018   Lower extremity edema 05/29/2018   PVC's (premature ventricular contractions) 05/09/2018   CKD (chronic kidney disease) stage 3, GFR 30-59 ml/min ([place]) 04/20/2018   HTN (hypertension) 03/30/2017   Atypical chest pain 03/30/2017   Chronic headache disorder 03/30/2017   Osteoarthritis 03/30/2017   Venous insufficiency 03/30/2017   Diverticulosis 03/30/2017   Bigeminy 03/30/2017   DOE (dyspnea on exertion) 03/30/2017   Idiopathic hypotension 03/30/2017   Chest pain with moderate risk for cardiac etiology 03/30/2017   Gout    Renal disorder     Standley Brooking, PTA 12/06/2021, 2:41 PM  [facility] [address] [place], [place], [address] Phone: [phone]   Fax:  [phone]  Name: Rhonda Spears MRN: [id number] Date of Birth: 08-26-1934

## 2021-12-13 ENCOUNTER — Other Ambulatory Visit: Payer: Self-pay

## 2021-12-13 ENCOUNTER — Ambulatory Visit: Payer: Medicare Other

## 2021-12-13 DIAGNOSIS — M5441 Lumbago with sciatica, right side: Secondary | ICD-10-CM

## 2021-12-13 DIAGNOSIS — M25511 Pain in right shoulder: Secondary | ICD-10-CM | POA: Diagnosis not present

## 2021-12-13 DIAGNOSIS — G8929 Other chronic pain: Secondary | ICD-10-CM

## 2021-12-13 DIAGNOSIS — R293 Abnormal posture: Secondary | ICD-10-CM

## 2021-12-13 NOTE — Therapy (Signed)
[facility] [place], [place], [address] Phone: [phone]   Fax:  [phone]  Physical Therapy Treatment  Patient Details  Name: Rhonda Spears MRN: [id number] Date of Birth: 09-07-1934 Referring Provider (PT): Wandra Feinstein MD   Encounter Date: 12/13/2021   PT End of Session - 12/13/21 1334     Visit Number 12    Number of Visits 12    Date for PT Re-Evaluation 01/02/22    Authorization Type FOTO AT LEAST EVERY 5TH VISIT.  PROGRESS NOTE AT 10TH VISIT.  KX MODIFIER AFTER 15 VISITS.    PT Start Time 1335    PT Stop Time 1426    PT Time Calculation (min) 51 min    Activity Tolerance Patient limited by pain    Behavior During Therapy [facility] for tasks assessed/performed             Past Medical History:  Diagnosis Date   Chest pain with moderate risk for cardiac etiology 03/30/2017   CKD (chronic kidney disease), stage III (HCC)    COPD (chronic obstructive pulmonary disease) (HCC)    Gout    Hypertension    PVC's (premature ventricular contractions)    Renal disorder    Sinus bradycardia    Tricuspid regurgitation    Venous insufficiency     Past Surgical History:  Procedure Laterality Date   ABDOMINAL HYSTERECTOMY     arthroscopic left knee     INTRAMEDULLARY (IM) NAIL INTERTROCHANTERIC Right 06/12/2018   Procedure: INTRAMEDULLARY (IM) NAIL INTERTROCHANTRIC;  Surgeon: Hiram Gash, MD;  Location: [facility];  Service: Orthopedics;  Laterality: Right;   repair left femoral neck     right medial men      There were no vitals filed for this visit.   Subjective Assessment - 12/13/21 1336     Subjective COVID-19 screen performed prior to patient entering clinic. Patient reports that her back is hurting a little today, but not as bad as it was prior to therapy. She notes that both her back and shoulder have gotten better since starting therapy.    Pertinent History OP, COPD, HTN, bilateral hip ORIF, femoral  fracture, heart trouble, jaw fracture.    How long can you sit comfortably? Unlimited.    How long can you stand comfortably? 5 minutes.    How long can you walk comfortably? 5 minutes.    Patient Stated Goals reach into microwave with right UE and walk with less pain.    Currently in Pain? Yes    Pain Score 4     Pain Location Back    Pain Orientation Lower    Pain Onset In the past 7 days    Pain Onset In the past 7 days                               [facility] Adult PT Treatment/Exercise - 12/13/21 0001       Shoulder Exercises: Seated   Flexion Right;20 reps;Theraband    Theraband Level (Shoulder Flexion) Level 3 (Green)      Shoulder Exercises: ROM/Strengthening   Nustep L1 x 16 minutes      Modalities   Modalities Electrical Stimulation      Electrical Stimulation   Electrical Stimulation Location Bilateral low back    Electrical Stimulation Action 2 channel pre-mod    Electrical Stimulation Parameters 80-150 Hz x 15 minutes    Electrical  Stimulation Goals Pain                          PT Long Term Goals - 12/13/21 1338       PT LONG TERM GOAL #1   Title Independent with a HEP.    Time 6    Period Weeks    Status Achieved      PT LONG TERM GOAL #2   Title Active right shoulder flexion to 130 degrees so the patient can easily reach overhead.    Baseline 128 degrees    Time 6    Period Weeks    Status Not Met      PT LONG TERM GOAL #3   Title Stand and walk 15 minutes with low back pain-level not > 3-4/10.    Baseline <1 minute without upper extremity support and >15-20 minutes when she has upper extremity support    Time 6    Period Weeks    Status Partially Met      PT LONG TERM GOAL #4   Title Eliminate posterior thigh symptoms.    Baseline intermittent pain along her posterior thigh    Time 6    Period Weeks    Status Partially Met                   Plan - 12/13/21 1338     Clinical Impression  Statement Patient has made fair progress with physical therapy as evidenced by her progress toward her goals. However, she reported feeling that her shoulder and her back felt significantly better since beginning therapy. She experienced no increase in her familiar pain or discomfort with any of today's interventions. She was educated on the benefits of electrical stimulation and how to safely setup and utilize her personal TENS unti. She reported feeling comfortable with her HEP and being discharged at this time.    Personal Factors and Comorbidities Comorbidity 1;Comorbidity 2;Other    Comorbidities OP, COPD, HTN, bilateral hip ORIF, femoral fracture, heart trouble, jaw fracture.    Examination-Activity Limitations Stand;Other;Locomotion Level;Reach Overhead    Examination-Participation Restrictions Other;Meal Prep    Stability/Clinical Decision Making Evolving/Moderate complexity    Rehab Potential Good    PT Frequency 2x / week    PT Duration 6 weeks    PT Treatment/Interventions ADLs/Self Care Home Management;Cryotherapy;Electrical Stimulation;Ultrasound;Moist Heat;Iontophoresis 66m/ml Dexamethasone;Therapeutic activities;Therapeutic exercise;Manual techniques;Patient/family education;Passive range of motion;Dry needling;Vasopneumatic Device    PT Next Visit Plan Combo e'stim/US to right anterior shoulder, AAROM progressing to low-level strengthening, low-level core exercise progression.  HMP, electrical stimulation.    Consulted and Agree with Plan of Care Patient             Patient will benefit from skilled therapeutic intervention in order to improve the following deficits and impairments:  Pain, Abnormal gait, Decreased activity tolerance, Decreased range of motion, Postural dysfunction, Decreased strength  Visit Diagnosis: Chronic right shoulder pain  Chronic midline low back pain with bilateral sciatica  Abnormal posture     Problem List Patient Active Problem List    Diagnosis Date Noted   Lumbar compression fracture ([facility] 10/28/2020   Low back pain 10/07/2020   Bronchiectasis ([place] 02/29/2020   Chronic diastolic CHF (congestive heart failure) ([place] 02/29/2020   CKD (chronic kidney disease), stage III ([place] 02/29/2020   Essential hypertension 02/29/2020   Chest pain 02/28/2020   Nodule of right lung 06/30/2019   Closed right hip fracture, initial encounter (  [place]) 06/12/2018   Lower extremity edema 05/29/2018   PVC's (premature ventricular contractions) 05/09/2018   CKD (chronic kidney disease) stage 3, GFR 30-59 ml/min (HCC) 04/20/2018   HTN (hypertension) 03/30/2017   Atypical chest pain 03/30/2017   Chronic headache disorder 03/30/2017   Osteoarthritis 03/30/2017   Venous insufficiency 03/30/2017   Diverticulosis 03/30/2017   Bigeminy 03/30/2017   DOE (dyspnea on exertion) 03/30/2017   Idiopathic hypotension 03/30/2017   Chest pain with moderate risk for cardiac etiology 03/30/2017   Gout    Renal disorder     Darlin Coco, PT 12/13/2021, 3:36 PM  [facility] [place], [place], [address] Phone: [phone]   Fax:  [phone]  Name: JENNAVIEVE ARRICK MRN: [id number] Date of Birth: 10-14-1934  PHYSICAL THERAPY DISCHARGE SUMMARY  Visits from Start of Care: 12  Current functional level related to goals / functional outcomes: See clinical impression statement   Remaining deficits: AROM and low back pain    Education / Equipment: HEP    Patient agrees to discharge. Patient goals were partially met. Patient is being discharged due to being pleased with the current functional level.

## 2022-04-12 ENCOUNTER — Other Ambulatory Visit: Payer: Self-pay | Admitting: Family Medicine

## 2022-04-12 DIAGNOSIS — G459 Transient cerebral ischemic attack, unspecified: Secondary | ICD-10-CM

## 2022-04-26 ENCOUNTER — Other Ambulatory Visit: Payer: Medicare Other

## 2022-06-22 ENCOUNTER — Ambulatory Visit (INDEPENDENT_AMBULATORY_CARE_PROVIDER_SITE_OTHER): Payer: Medicare Other | Admitting: Internal Medicine

## 2022-06-22 ENCOUNTER — Encounter: Payer: Self-pay | Admitting: Internal Medicine

## 2022-06-22 VITALS — BP 182/96 | HR 74 | Ht 61.0 in | Wt 123.0 lb

## 2022-06-22 DIAGNOSIS — I10 Essential (primary) hypertension: Secondary | ICD-10-CM

## 2022-06-22 MED ORDER — CARVEDILOL 3.125 MG PO TABS: 3.1250 mg | ORAL_TABLET | Freq: Two times a day (BID) | ORAL | 3 refills | Status: DC

## 2022-06-22 NOTE — Patient Instructions (Signed)
Medication Instructions:  Your physician has recommended you make the following change in your medication:   Start Coreg 3.125 mg Two Times Daily   *If you need a refill on your cardiac medications before your next appointment, please call your pharmacy*   Lab Work: NONE   If you have labs (blood work) drawn today and your tests are completely normal, you will receive your results only by: MyChart Message (if you have MyChart) OR A paper copy in the mail If you have any lab test that is abnormal or we need to change your treatment, we will call you to review the results.   Testing/Procedures: Your physician recommends that you schedule a follow-up appointment in: 3 weeks for a blood pressure check.     Follow-Up: At [facility], you and your health needs are our priority.  As part of our continuing mission to provide you with exceptional heart care, we have created designated Provider Care Teams.  These Care Teams include your primary Cardiologist (physician) and Advanced Practice Providers (APPs -  Physician Assistants and Nurse Practitioners) who all work together to provide you with the care you need, when you need it.  We recommend signing up for the patient portal called "MyChart".  Sign up information is provided on this After Visit Summary.  MyChart is used to connect with patients for Virtual Visits (Telemedicine).  Patients are able to view lab/test results, encounter notes, upcoming appointments, etc.  Non-urgent messages can be sent to your provider as well.   To learn more about what you can do with MyChart, go to [email or web address].    Your next appointment:   6 month(s)  The format for your next appointment:   In Person  Provider:   Lewayne Bunting, MD    Other Instructions Thank you for choosing [facility]!    Important Information About Sugar

## 2022-06-22 NOTE — Progress Notes (Signed)
HPI Rhonda Spears returns today for followup. She is a pleasant 88 yo woman with a h/o PVC's, COPD, and diastolic heart failure. She was placed on amiodarone and her PVC's improved. However, she developed problems with her balance and I stopped the amiodarone over 2years ago. She has not had much in the way of symptoms. She denies chest pain or sob. She has developed edema since starting amlodipine which is mild. Her bp has been up. She does not feel much in the way of palpitations.  Allergies  Allergen Reactions   Latex Itching   Omeprazole Palpitations   Tramadol Itching and Nausea Only   Sulfa Antibiotics Nausea Only and Other (See Comments)    Severe headaches   Zanaflex [Tizanidine Hcl] Itching and Other (See Comments)    oversedation     Current Outpatient Medications  Medication Sig Dispense Refill   allopurinol (ZYLOPRIM) 100 MG tablet Take 100 mg by mouth at bedtime.      aspirin EC 81 MG tablet Take 81 mg by mouth every Monday, Wednesday, and Friday.      Calcium Carb-Cholecalciferol (CALCIUM 600 + D PO) Take 1 tablet by mouth daily.     calcium carbonate (TUMS - DOSED IN MG ELEMENTAL CALCIUM) 500 MG chewable tablet Chew 1 tablet by mouth as needed for indigestion or heartburn.     Carboxymethylcellulose Sod PF (THERATEARS PF) 0.25 % SOLN Place 1 drop into both eyes 2 (two) times daily as needed (dry eyes).     cholecalciferol (VITAMIN D) 1000 units tablet Take 1,000 Units by mouth every morning.      docusate sodium (COLACE) 100 MG capsule Take 100 mg by mouth as needed.      folic acid (FOLVITE) 400 MCG tablet Take 400 mcg by mouth daily.     Magnesium 250 MG TABS Take 250 mg by mouth daily.     Multiple Vitamin (MULTIVITAMIN) capsule Take 1 capsule by mouth daily.     nitroGLYCERIN (NITROSTAT) 0.4 MG SL tablet Place 1 tablet (0.4 mg total) under the tongue every 5 (five) minutes as needed for chest pain. 30 tablet 0   amLODipine (NORVASC) 2.5 MG tablet Take 1 tablet  by mouth once daily (Patient not taking: Reported on 06/22/2022) 90 tablet 2   diclofenac sodium (VOLTAREN) 1 % GEL Apply 2 g topically 2 (two) times daily as needed (pain).  (Patient not taking: Reported on 06/22/2022)     furosemide (LASIX) 20 MG tablet Take 1 tablet (20 mg total) by mouth daily. (Patient not taking: Reported on 06/22/2022) 90 tablet 3   HYDROcodone-acetaminophen (NORCO/VICODIN) 5-325 MG tablet Take 1 tablet by mouth every 6 (six) hours as needed for moderate pain. (Patient not taking: Reported on 06/22/2022)     No current facility-administered medications for this visit.     Past Medical History:  Diagnosis Date   Chest pain with moderate risk for cardiac etiology 03/30/2017   CKD (chronic kidney disease), stage III (HCC)    COPD (chronic obstructive pulmonary disease) (HCC)    Gout    Hypertension    PVC's (premature ventricular contractions)    Renal disorder    Sinus bradycardia    Tricuspid regurgitation    Venous insufficiency     ROS:   All systems reviewed and negative except as noted in the HPI.   Past Surgical History:  Procedure Laterality Date   ABDOMINAL HYSTERECTOMY     arthroscopic left knee  INTRAMEDULLARY (IM) NAIL INTERTROCHANTERIC Right 06/12/2018   Procedure: INTRAMEDULLARY (IM) NAIL INTERTROCHANTRIC;  Surgeon: Hiram Gash, MD;  Location: [facility];  Service: Orthopedics;  Laterality: Right;   repair left femoral neck     right medial men       Family History  Problem Relation Age of Onset   Heart attack Mother    Hypertension Mother    Early death Father 75       Car accident     Social History   Socioeconomic History   Marital status: Divorced    Spouse name: Not on file   Number of children: 28   Years of education: Not on file   Highest education level: Not on file  Occupational History   Occupation: Retired  Tobacco Use   Smoking status: Never   Smokeless tobacco: Never  Vaping Use   Vaping Use: Never used   Substance and Sexual Activity   Alcohol use: No   Drug use: No   Sexual activity: Not on file  Other Topics Concern   Not on file  Social History Narrative   Pt lives alone in [place], [place]   Social Determinants of Health   Financial Resource Strain: [place]  (06/27/2019)   Overall Financial Resource Strain (CARDIA)    Difficulty of Paying Living Expenses: Not very hard  Food Insecurity: No Food Insecurity (06/27/2019)   Hunger Vital Sign    Worried About Running Out of Food in the Last Year: Never true    [place] in the Last Year: Never true  Transportation Needs: No Transportation Needs (06/27/2019)   PRAPARE - Hydrologist (Medical): No    Lack of Transportation (Non-Medical): No  Physical Activity: Inactive (06/27/2019)   Exercise Vital Sign    Days of Exercise per Week: 0 days    Minutes of Exercise per Session: 0 min  Stress: No Stress Concern Present (06/27/2019)   [place]    Feeling of Stress : Not at all  Social Connections: Moderately Integrated (06/27/2019)   Social Connection and Isolation Panel [NHANES]    Frequency of Communication with Friends and Family: Three times a week    Frequency of Social Gatherings with Friends and Family: Three times a week    Attends Religious Services: More than 4 times per year    Active Member of Clubs or Organizations: Yes    Attends Archivist Meetings: More than 4 times per year    Marital Status: Divorced  Intimate Partner Violence: Not At Risk (06/27/2019)   Humiliation, Afraid, Rape, and Kick questionnaire    Fear of Current or Ex-Partner: No    Emotionally Abused: No    Physically Abused: No    Sexually Abused: No     BP (!) 182/96   Pulse 74   Ht 5\' 1"  (1.549 m)   Wt 123 lb (55.8 kg)   SpO2 96%   BMI 23.24 kg/m   Physical Exam:  elderly appearing NAD HEENT: Unremarkable Neck:  No JVD, no  thyromegally Lymphatics:  No adenopathy Back:  No CVA tenderness Lungs:  Clear with no wheezes HEART:  IRegular rate rhythm, no murmurs, no rubs, no clicks Abd:  soft, positive bowel sounds, no organomegally, no rebound, no guarding Ext:  2 plus pulses, no edema, no cyanosis, no clubbing Skin:  No rashes no nodules Neuro:  CN II through XII intact, motor  grossly intact  EKG - nsr with PVC's  DEVICE  Normal device function.  See PaceArt for details.   Assess/Plan:  1. PVC's - she has not had much in the way of symptoms though her PVC's are present she is minimally symptomatic. 2. Dizziness - this has resolved after stopping amiodarone. We will follow. 3. Diastolic heart failure - her symptoms are class 2. I asked her to reduce her salt intake.She will start coreg. 4. HTN - her bp is better is elevated despite amlodipine and she will start low dose coreg and return in 3 weeks to check her bp.   Leonia Reeves.D

## 2022-07-13 ENCOUNTER — Ambulatory Visit: Payer: Medicare Other | Attending: Cardiology

## 2022-07-13 VITALS — BP 110/64 | HR 60 | Ht 61.0 in | Wt 122.0 lb

## 2022-07-13 DIAGNOSIS — I10 Essential (primary) hypertension: Secondary | ICD-10-CM | POA: Diagnosis present

## 2022-07-13 NOTE — Progress Notes (Signed)
Patient presents today for nurse visit- BP check.  Pt states that she hasn't felt great for the last few weeks, but that it is d/t noncardiac reasons.   Pt states that she is compliant with all medications.   Pt denies N/V and no new SOB.

## 2022-07-13 NOTE — Addendum Note (Signed)
Addended by: Leonides Schanz C on: 07/13/2022 04:06 PM   Modules accepted: Level of Service

## 2022-12-26 ENCOUNTER — Ambulatory Visit: Payer: Medicare Other | Attending: Internal Medicine | Admitting: Internal Medicine

## 2022-12-26 ENCOUNTER — Encounter: Payer: Self-pay | Admitting: Internal Medicine

## 2022-12-26 VITALS — BP 126/72 | HR 66 | Ht 61.5 in | Wt 119.0 lb

## 2022-12-26 DIAGNOSIS — I493 Ventricular premature depolarization: Secondary | ICD-10-CM | POA: Diagnosis present

## 2022-12-26 NOTE — Patient Instructions (Signed)
Medication Instructions:  Your physician recommends that you continue on your current medications as directed. Please refer to the Current Medication list given to you today.  *If you need a refill on your cardiac medications before your next appointment, please call your pharmacy*   Lab Work: NONE   If you have labs (blood work) drawn today and your tests are completely normal, you will receive your results only by: [place] (if you have MyChart) OR A paper copy in the mail If you have any lab test that is abnormal or we need to change your treatment, we will call you to review the results.   Testing/Procedures: NONE    Follow-Up: At [facility], you and your health needs are our priority.  As part of our continuing mission to provide you with exceptional heart care, we have created designated Provider Care Teams.  These Care Teams include your primary Cardiologist (physician) and Advanced Practice Providers (APPs -  Physician Assistants and Nurse Practitioners) who all work together to provide you with the care you need, when you need it.  We recommend signing up for the patient portal called "MyChart".  Sign up information is provided on this After Visit Summary.  MyChart is used to connect with patients for Virtual Visits (Telemedicine).  Patients are able to view lab/test results, encounter notes, upcoming appointments, etc.  Non-urgent messages can be sent to your provider as well.   To learn more about what you can do with MyChart, go to [email or web address].    Your next appointment:   1 year(s)  Provider:   Cristopher Peru, MD    Other Instructions Thank you for choosing [facility]!

## 2022-12-26 NOTE — Progress Notes (Signed)
HPI Rhonda Spears returns today for followup. She is a pleasant 89 yo woman with a h/o PVC's, COPD, and diastolic heart failure. She was placed on amiodarone and her PVC's improved. However, she developed problems with her balance and I stopped the amiodarone over 3 years ago. She has not had much in the way of symptoms. She denies chest pain or sob. She has developed edema since starting amlodipine which is mild. Her bp has been up. She does not feel much in the way of palpitations.   Allergies  Allergen Reactions   Latex Itching   Omeprazole Palpitations   Tramadol Itching and Nausea Only   Sulfa Antibiotics Nausea Only and Other (See Comments)    Severe headaches   Zanaflex [Tizanidine Hcl] Itching and Other (See Comments)    oversedation     Current Outpatient Medications  Medication Sig Dispense Refill   alendronate (FOSAMAX) 70 MG tablet Take 70 mg by mouth once a week.     amLODipine (NORVASC) 2.5 MG tablet Take 1 tablet by mouth once daily 90 tablet 2   aspirin EC 81 MG tablet Take 81 mg by mouth every Monday, Wednesday, and Friday.      Calcium Carb-Cholecalciferol (CALCIUM 600 + D PO) Take 1 tablet by mouth daily.     calcium carbonate (TUMS - DOSED IN MG ELEMENTAL CALCIUM) 500 MG chewable tablet Chew 1 tablet by mouth as needed for indigestion or heartburn.     Carboxymethylcellulose Sod PF (THERATEARS PF) 0.25 % SOLN Place 1 drop into both eyes 2 (two) times daily as needed (dry eyes).     carvedilol (COREG) 3.125 MG tablet Take 1 tablet (3.125 mg total) by mouth 2 (two) times daily. 180 tablet 3   cholecalciferol (VITAMIN D) 1000 units tablet Take 1,000 Units by mouth every morning.      docusate sodium (COLACE) 100 MG capsule Take 100 mg by mouth as needed.      folic acid (FOLVITE) A999333 MCG tablet Take 400 mcg by mouth daily.     furosemide (LASIX) 20 MG tablet Take 1 tablet (20 mg total) by mouth daily. 90 tablet 3   Magnesium 250 MG TABS Take 250 mg by mouth daily.      Multiple Vitamin (MULTIVITAMIN) capsule Take 1 capsule by mouth daily.     nitroGLYCERIN (NITROSTAT) 0.4 MG SL tablet Place 1 tablet (0.4 mg total) under the tongue every 5 (five) minutes as needed for chest pain. 30 tablet 0   allopurinol (ZYLOPRIM) 100 MG tablet Take 100 mg by mouth at bedtime.  (Patient not taking: Reported on 12/26/2022)     diclofenac sodium (VOLTAREN) 1 % GEL Apply 2 g topically 2 (two) times daily as needed (pain).  (Patient not taking: Reported on 06/22/2022)     HYDROcodone-acetaminophen (NORCO/VICODIN) 5-325 MG tablet Take 1 tablet by mouth every 6 (six) hours as needed for moderate pain. (Patient not taking: Reported on 06/22/2022)     No current facility-administered medications for this visit.     Past Medical History:  Diagnosis Date   Chest pain with moderate risk for cardiac etiology 03/30/2017   CKD (chronic kidney disease), stage III (HCC)    COPD (chronic obstructive pulmonary disease) (HCC)    Gout    Hypertension    PVC's (premature ventricular contractions)    Renal disorder    Sinus bradycardia    Tricuspid regurgitation    Venous insufficiency     ROS:  All systems reviewed and negative except as noted in the HPI.   Past Surgical History:  Procedure Laterality Date   ABDOMINAL HYSTERECTOMY     arthroscopic left knee     INTRAMEDULLARY (IM) NAIL INTERTROCHANTERIC Right 06/12/2018   Procedure: INTRAMEDULLARY (IM) NAIL INTERTROCHANTRIC;  Surgeon: Hiram Gash, MD;  Location: [place];  Service: Orthopedics;  Laterality: Right;   repair left femoral neck     right medial men       Family History  Problem Relation Age of Onset   Heart attack Mother    Hypertension Mother    Early death Father 52       Car accident     Social History   Socioeconomic History   Marital status: Divorced    Spouse name: Not on file   Number of children: 31   Years of education: Not on file   Highest education level: Not on file  Occupational  History   Occupation: Retired  Tobacco Use   Smoking status: Never   Smokeless tobacco: Never  Vaping Use   Vaping Use: Never used  Substance and Sexual Activity   Alcohol use: No   Drug use: No   Sexual activity: Not on file  Other Topics Concern   Not on file  Social History Narrative   Pt lives alone in [place], [place]   Social Determinants of Health   Financial Resource Strain: [place]  (06/27/2019)   Overall Financial Resource Strain (CARDIA)    Difficulty of Paying Living Expenses: Not very hard  Food Insecurity: No Food Insecurity (06/27/2019)   Hunger Vital Sign    Worried About Running Out of Food in the Last Year: Never true    [place] in the Last Year: Never true  Transportation Needs: No Transportation Needs (06/27/2019)   PRAPARE - Hydrologist (Medical): No    Lack of Transportation (Non-Medical): No  Physical Activity: Inactive (06/27/2019)   Exercise Vital Sign    Days of Exercise per Week: 0 days    Minutes of Exercise per Session: 0 min  Stress: No Stress Concern Present (06/27/2019)   [place]    Feeling of Stress : Not at all  Social Connections: Moderately Integrated (06/27/2019)   Social Connection and Isolation Panel [NHANES]    Frequency of Communication with Friends and Family: Three times a week    Frequency of Social Gatherings with Friends and Family: Three times a week    Attends Religious Services: More than 4 times per year    Active Member of Clubs or Organizations: Yes    Attends Archivist Meetings: More than 4 times per year    Marital Status: Divorced  Intimate Partner Violence: Not At Risk (06/27/2019)   Humiliation, Afraid, Rape, and Kick questionnaire    Fear of Current or Ex-Partner: No    Emotionally Abused: No    Physically Abused: No    Sexually Abused: No     BP 126/72   Pulse 66   Ht 5' 1.5" (1.562 m)   Wt 119 lb  (54 kg)   SpO2 95%   BMI 22.12 kg/m   Physical Exam:  Well appearing NAD HEENT: Unremarkable Neck:  No JVD, no thyromegally Lymphatics:  No adenopathy Back:  No CVA tenderness Lungs:  Clear HEART:  Regular rate rhythm, no murmurs, no rubs, no clicks Abd:  soft, positive bowel sounds,  no organomegally, no rebound, no guarding Ext:  2 plus pulses, no edema, no cyanosis, no clubbing Skin:  No rashes no nodules Neuro:  CN II through XII intact, motor grossly intact  DEVICE  Normal device function.  See PaceArt for details.   Assess/Plan: 1. PVC's - she has not had much in the way of symptoms though her PVC's are present she is minimally symptomatic. 2. Dizziness - this has resolved after stopping amiodarone. We will follow. 3. Diastolic heart failure - her symptoms are class 2. I asked her to reduce her salt intake.She will start coreg. 4. HTN - her bp is better is elevated despite amlodipine and she will continue low dose coreg.   Mikle Bosworth.D

## 2023-06-07 ENCOUNTER — Other Ambulatory Visit: Payer: Self-pay | Admitting: Internal Medicine

## 2023-12-26 ENCOUNTER — Ambulatory Visit: Payer: Medicare Other | Attending: Internal Medicine | Admitting: Internal Medicine

## 2023-12-26 ENCOUNTER — Encounter: Payer: Self-pay | Admitting: Internal Medicine

## 2023-12-26 VITALS — BP 118/66 | HR 70 | Ht 61.0 in | Wt 103.3 lb

## 2023-12-26 DIAGNOSIS — I493 Ventricular premature depolarization: Secondary | ICD-10-CM

## 2023-12-26 NOTE — Patient Instructions (Signed)

## 2023-12-26 NOTE — Progress Notes (Signed)
 HPI [name] returns today for followup. She is a pleasant 89 yo woman with a h/o PVC's, COPD, and diastolic heart failure. She was placed on amiodarone and her PVC's improved. However, she developed problems with her balance and I stopped the amiodarone over 4 years ago. She has not had much in the way of symptoms. She denies chest pain or sob. She has developed edema since starting amlodipine which is mild. Her bp has been better. She does not feel much in the way of palpitations.  she has lost weight.  She was told 5 years ago she had a spot on her lung but declined workup or treatment.  Allergies  Allergen Reactions   Latex Itching   Omeprazole Palpitations   Tramadol Itching and Nausea Only   Sulfa Antibiotics Nausea Only and Other (See Comments)    Severe headaches   Zanaflex [Tizanidine Hcl] Itching and Other (See Comments)    oversedation     Current Outpatient Medications  Medication Sig Dispense Refill   alendronate (FOSAMAX) 70 MG tablet Take 70 mg by mouth once a week.     amLODipine (NORVASC) 2.5 MG tablet Take 1 tablet by mouth once daily 90 tablet 2   aspirin EC 81 MG tablet Take 81 mg by mouth every Monday, Wednesday, and Friday.      Calcium Carb-Cholecalciferol (CALCIUM 600 + D PO) Take 1 tablet by mouth daily.     calcium carbonate (TUMS - DOSED IN MG ELEMENTAL CALCIUM) 500 MG chewable tablet Chew 1 tablet by mouth as needed for indigestion or heartburn.     Carboxymethylcellulose Sod PF (THERATEARS PF) 0.25 % SOLN Place 1 drop into both eyes 2 (two) times daily as needed (dry eyes).     carvedilol (COREG) 3.125 MG tablet Take 1 tablet by mouth twice daily 180 tablet 3   cholecalciferol (VITAMIN D) 1000 units tablet Take 1,000 Units by mouth every morning.      diclofenac sodium (VOLTAREN) 1 % GEL Apply 2 g topically 2 (two) times daily as needed (pain).     docusate sodium (COLACE) 100 MG capsule Take 100 mg by mouth as needed.      DULoxetine (CYMBALTA)  30 MG capsule Take 30 mg by mouth daily.     folic acid (FOLVITE) 400 MCG tablet Take 400 mcg by mouth daily.     furosemide (LASIX) 20 MG tablet Take 1 tablet (20 mg total) by mouth daily. 90 tablet 3   HYDROcodone-acetaminophen (NORCO/VICODIN) 5-325 MG tablet Take 1 tablet by mouth every 6 (six) hours as needed for moderate pain (pain score 4-6).     Magnesium 250 MG TABS Take 250 mg by mouth daily.     Multiple Vitamin (MULTIVITAMIN) capsule Take 1 capsule by mouth daily.     nitroGLYCERIN (NITROSTAT) 0.4 MG SL tablet Place 1 tablet (0.4 mg total) under the tongue every 5 (five) minutes as needed for chest pain. 30 tablet 0   traMADol (ULTRAM) 50 MG tablet Take 50 mg by mouth 4 (four) times daily.     allopurinol (ZYLOPRIM) 100 MG tablet Take 100 mg by mouth at bedtime.  (Patient not taking: Reported on 12/26/2023)     No current facility-administered medications for this visit.     Past Medical History:  Diagnosis Date   Chest pain with moderate risk for cardiac etiology 03/30/2017   CKD (chronic kidney disease), stage III (HCC)    COPD (chronic obstructive pulmonary disease) (HCC)  Gout    Hypertension    PVC's (premature ventricular contractions)    Renal disorder    Sinus bradycardia    Tricuspid regurgitation    Venous insufficiency     ROS:   All systems reviewed and negative except as noted in the HPI.   Past Surgical History:  Procedure Laterality Date   ABDOMINAL HYSTERECTOMY     arthroscopic left knee     INTRAMEDULLARY (IM) NAIL INTERTROCHANTERIC Right 06/12/2018   Procedure: INTRAMEDULLARY (IM) NAIL INTERTROCHANTRIC;  Surgeon: Bjorn Pippin, MD;  Location[facility];  Service: Orthopedics;  Laterality: Right;   repair left femoral neck     right medial men       Family History  Problem Relation Age of Onset   Heart attack Mother    Hypertension Mother    Early death Father 44       Car accident     Social History   Socioeconomic History   Marital  status: Divorced    Spouse name: Not on file   Number of children: 10   Years of education: Not on file   Highest education level: Not on file  Occupational History   Occupation: Retired  Tobacco Use   Smoking status: Never   Smokeless tobacco: Never  Vaping Use   Vaping status: Never Used  Substance and Sexual Activity   Alcohol use: No   Drug use: No   Sexual activity: Not on file  Other Topics Concern   Not on file  Social History Narrative   Pt lives alone in [place], [place]   Social Drivers of Health   Financial Resource Strain: Low Risk  (06/27/2019)   Overall Financial Resource Strain (CARDIA)    Difficulty of Paying Living Expenses: Not very hard  Food Insecurity: No Food Insecurity (12/05/2021)   Received [facility], [facility]   Hunger Vital Sign    Worried About Running Out of Food in the Last Year: Never true    Ran Out of Food in the Last Year: Never true  Transportation Needs: No Transportation Needs (12/05/2021)   Received from [facility], [facility]   [facility] - Transportation    Lack of Transportation (Medical): No    Lack of Transportation (Non-Medical): No  Physical Activity: Inactive (06/27/2019)   Exercise Vital Sign    Days of Exercise per Week: 0 days    Minutes of Exercise per Session: 0 min  Stress: No Stress Concern Present (06/27/2019)   [employer] of Occupational Health - Occupational Stress Questionnaire    Feeling of Stress : Not at all  Social Connections: Moderately Integrated (06/27/2019)   Social Connection and Isolation Panel [NHANES]    Frequency of Communication with Friends and Family: Three times a week    Frequency of Social Gatherings with Friends and Family: Three times a week    Attends Religious Services: More than 4 times per year    Active Member of Clubs or Organizations: Yes    Attends Banker Meetings: More than 4 times per year    Marital Status: Divorced  Intimate Partner Violence:  Not At Risk (06/27/2019)   Humiliation, Afraid, Rape, and Kick questionnaire    Fear of Current or Ex-Partner: No    Emotionally Abused: No    Physically Abused: No    Sexually Abused: No     BP 118/66   Pulse 70   Ht 5\' 1"  (1.549 m)  Wt 103 lb 4.8 oz (46.9 kg)   SpO2 94%   BMI 19.52 kg/m   Physical Exam:  Stable but frail appearing NAD HEENT: Unremarkable Neck:  No JVD, no thyromegally Lymphatics:  No adenopathy Back:  No CVA tenderness Lungs:  Clear with no wheezes HEART:  Regular rate rhythm, no murmurs, no rubs, no clicks Abd:  soft, positive bowel sounds, no organomegally, no rebound, no guarding Ext:  2 plus pulses, no edema, no cyanosis, no clubbing Skin:  No rashes no nodules Neuro:  CN II through XII intact, motor grossly intact  EKG - NSR   Assess/Plan:  1. PVC's - she has not had much in the way of symptoms though her PVC's are present she is minimally symptomatic. 2. Dizziness - this has resolved after stopping amiodarone. We will follow. 3. Diastolic heart failure - her symptoms are class 2. I asked her to reduce her salt intake.She will start coreg. 4. HTN - her bp is better is elevated despite amlodipine and she will continue low dose coreg.   Leonia Reeves.D

## 2024-06-16 ENCOUNTER — Other Ambulatory Visit: Payer: Self-pay | Admitting: Internal Medicine

## 2024-10-01 ENCOUNTER — Other Ambulatory Visit: Payer: Self-pay

## 2024-10-01 ENCOUNTER — Emergency Department (HOSPITAL_COMMUNITY)

## 2024-10-01 ENCOUNTER — Encounter (HOSPITAL_COMMUNITY): Payer: Self-pay

## 2024-10-01 ENCOUNTER — Emergency Department (HOSPITAL_COMMUNITY)
Admission: EM | Admit: 2024-10-01 | Discharge: 2024-10-01 | Disposition: A | Discharge status: Home / Self Care | Attending: Emergency Medicine | Admitting: Emergency Medicine

## 2024-10-01 DIAGNOSIS — Z79899 Other long term (current) drug therapy: Secondary | ICD-10-CM | POA: Insufficient documentation

## 2024-10-01 DIAGNOSIS — N3001 Acute cystitis with hematuria: Secondary | ICD-10-CM | POA: Diagnosis not present

## 2024-10-01 DIAGNOSIS — R319 Hematuria, unspecified: Secondary | ICD-10-CM | POA: Diagnosis present

## 2024-10-01 DIAGNOSIS — J449 Chronic obstructive pulmonary disease, unspecified: Secondary | ICD-10-CM | POA: Insufficient documentation

## 2024-10-01 DIAGNOSIS — Z7982 Long term (current) use of aspirin: Secondary | ICD-10-CM | POA: Diagnosis not present

## 2024-10-01 DIAGNOSIS — W19XXXA Unspecified fall, initial encounter: Secondary | ICD-10-CM | POA: Diagnosis not present

## 2024-10-01 DIAGNOSIS — Z91040 Latex allergy status: Secondary | ICD-10-CM | POA: Insufficient documentation

## 2024-10-01 DIAGNOSIS — E860 Dehydration: Secondary | ICD-10-CM | POA: Diagnosis not present

## 2024-10-01 DIAGNOSIS — I10 Essential (primary) hypertension: Secondary | ICD-10-CM | POA: Diagnosis not present

## 2024-10-01 LAB — CBC WITH DIFFERENTIAL/PLATELET
Abs Immature Granulocytes: 0.02 K/uL (ref 0.00–0.07)
Basophils Absolute: 0.1 K/uL (ref 0.0–0.1)
Basophils Relative: 1 %
Eosinophils Absolute: 0.3 K/uL (ref 0.0–0.5)
Eosinophils Relative: 3 %
HCT: 33.8 % — ABNORMAL LOW (ref 36.0–46.0)
Hemoglobin: 10.8 g/dL — ABNORMAL LOW (ref 12.0–15.0)
Immature Granulocytes: 0 %
Lymphocytes Relative: 29 %
Lymphs Abs: 2.5 K/uL (ref 0.7–4.0)
MCH: 31.3 pg (ref 26.0–34.0)
MCHC: 32 g/dL (ref 30.0–36.0)
MCV: 98 fL (ref 80.0–100.0)
Monocytes Absolute: 0.7 K/uL (ref 0.1–1.0)
Monocytes Relative: 8 %
Neutro Abs: 5.2 K/uL (ref 1.7–7.7)
Neutrophils Relative %: 59 %
Platelets: 261 K/uL (ref 150–400)
RBC: 3.45 MIL/uL — ABNORMAL LOW (ref 3.87–5.11)
RDW: 14.5 % (ref 11.5–15.5)
WBC: 8.8 K/uL (ref 4.0–10.5)
nRBC: 0 % (ref 0.0–0.2)

## 2024-10-01 LAB — COMPREHENSIVE METABOLIC PANEL WITH GFR
ALT: 8 U/L (ref 0–44)
AST: 24 U/L (ref 15–41)
Albumin: 3.6 g/dL (ref 3.5–5.0)
Alkaline Phosphatase: 51 U/L (ref 38–126)
Anion gap: 12 (ref 5–15)
BUN: 32 mg/dL — ABNORMAL HIGH (ref 8–23)
CO2: 28 mmol/L (ref 22–32)
Calcium: 13 mg/dL — ABNORMAL HIGH (ref 8.9–10.3)
Chloride: 97 mmol/L — ABNORMAL LOW (ref 98–111)
Creatinine, Ser: 1.48 mg/dL — ABNORMAL HIGH (ref 0.44–1.00)
GFR, Estimated: 34 mL/min — ABNORMAL LOW (ref >60)
Glucose, Bld: 85 mg/dL (ref 70–99)
Potassium: 3.5 mmol/L (ref 3.5–5.1)
Sodium: 137 mmol/L (ref 135–145)
Total Bilirubin: 0.4 mg/dL (ref 0.0–1.2)
Total Protein: 7.9 g/dL (ref 6.5–8.1)

## 2024-10-01 LAB — URINALYSIS, ROUTINE W REFLEX MICROSCOPIC
Bilirubin Urine: NEGATIVE
Glucose, UA: NEGATIVE mg/dL
Hgb urine dipstick: NEGATIVE
Ketones, ur: NEGATIVE mg/dL
Nitrite: NEGATIVE
Protein, ur: NEGATIVE mg/dL
Specific Gravity, Urine: 1.009 (ref 1.005–1.030)
pH: 7 (ref 5.0–8.0)

## 2024-10-01 MED ORDER — CEPHALEXIN 500 MG PO CAPS: 500.0000 mg | ORAL_CAPSULE | Freq: Four times a day (QID) | ORAL | 0 refills | Status: AC

## 2024-10-01 NOTE — Discharge Instructions (Signed)
Drink plenty of fluids and follow-up with your doctor next week for recheck °

## 2024-10-01 NOTE — ED Triage Notes (Signed)
 Pt arrived via POV with family who reports the Pt has had pain in the back of her head from a fall back in August and reports the Pt has been displaying weakness and fatigue and is having difficulty ambulating with her walker. Pts family does report Pt is experiencing Sundowning and has confusion daily for the past few days. Pt also reports back pain and presents A+O X 4 in Triage.

## 2024-10-01 NOTE — ED Provider Notes (Signed)
 [place] EMERGENCY DEPARTMENT AT [facility] Provider Note   CSN: [id number] Arrival date & time: 10/01/24  1111     Patient presents with: Rhonda Spears is a 89 y.o. female.   Patient complains of general weakness for a number of weeks.  She fell once about 4 months ago but had no loss of consciousness.  Patient has history of COPD and hypertension  The history is provided by the patient and a relative. No language interpreter was used.  Fall This is a new problem. The current episode started more than 1 week ago. The problem occurs rarely. The problem has been resolved. Pertinent negatives include no chest pain, no abdominal pain and no headaches. Nothing aggravates the symptoms. Nothing relieves the symptoms. She has tried nothing for the symptoms.       Prior to Admission medications   Medication Sig Start Date End Date Taking? Authorizing Provider  amLODipine  (NORVASC ) 2.5 MG tablet Take 1 tablet by mouth once daily 09/20/20  Yes Parthenia Olivia HERO, PA-C  carvedilol  (COREG ) 3.125 MG tablet Take 1 tablet by mouth twice daily 06/17/24  Yes Waddell Danelle ORN, MD  cephALEXin  (KEFLEX ) 500 MG capsule Take 1 capsule (500 mg total) by mouth 4 (four) times daily. 10/01/24  Yes Donnica Jarnagin, MD  DULoxetine (CYMBALTA) 30 MG capsule Take 30 mg by mouth daily. 10/23/23  Yes [provider]  traMADol (ULTRAM) 50 MG tablet Take 50 mg by mouth 4 (four) times daily. 08/16/23  Yes [provider]  alendronate (FOSAMAX) 70 MG tablet Take 70 mg by mouth once a week. 12/19/22   [provider]  allopurinol  (ZYLOPRIM ) 100 MG tablet Take 100 mg by mouth at bedtime.  Patient not taking: Reported on 12/26/2023    [provider]  aspirin  EC 81 MG tablet Take 81 mg by mouth every Monday, Wednesday, and Friday.     [provider]  Calcium  Carb-Cholecalciferol (CALCIUM  600 + D PO) Take 1 tablet by mouth daily.    [provider]   calcium  carbonate (TUMS - DOSED IN MG ELEMENTAL CALCIUM ) 500 MG chewable tablet Chew 1 tablet by mouth as needed for indigestion or heartburn.    [provider]  Carboxymethylcellulose Sod PF (THERATEARS PF) 0.25 % SOLN Place 1 drop into both eyes 2 (two) times daily as needed (dry eyes).    [provider]  cholecalciferol (VITAMIN D ) 1000 units tablet Take 1,000 Units by mouth every morning.     [provider]  diclofenac sodium (VOLTAREN) 1 % GEL Apply 2 g topically 2 (two) times daily as needed (pain). 07/24/18   [provider]  docusate sodium  (COLACE) 100 MG capsule Take 100 mg by mouth as needed.     [provider]  folic acid  (FOLVITE ) 400 MCG tablet Take 400 mcg by mouth daily.    [provider]  furosemide  (LASIX ) 20 MG tablet Take 1 tablet (20 mg total) by mouth daily. 05/21/19   Waddell Danelle ORN, MD  HYDROcodone-acetaminophen  (NORCO/VICODIN) 5-325 MG tablet Take 1 tablet by mouth every 6 (six) hours as needed for moderate pain (pain score 4-6).    [provider]  Magnesium  250 MG TABS Take 250 mg by mouth daily.    [provider]  Multiple Vitamin (MULTIVITAMIN) capsule Take 1 capsule by mouth daily.    [provider]  nitroGLYCERIN  (NITROSTAT ) 0.4 MG SL tablet Place 1 tablet (0.4 mg total) under the  tongue every 5 (five) minutes as needed for chest pain. 03/01/20   Milissa Tod PARAS, MD    Allergies: Latex, Omeprazole, Tramadol, Sulfa antibiotics, and Zanaflex [tizanidine hcl]    Review of Systems  Constitutional:  Positive for fatigue. Negative for appetite change.  HENT:  Negative for congestion, ear discharge and sinus pressure.   Eyes:  Negative for discharge.  Respiratory:  Negative for cough.   Cardiovascular:  Negative for chest pain.  Gastrointestinal:  Negative for abdominal pain and diarrhea.  Genitourinary:  Negative for frequency and hematuria.  Musculoskeletal:  Negative for back pain.   Skin:  Negative for rash.  Neurological:  Negative for seizures and headaches.  Psychiatric/Behavioral:  Negative for hallucinations.     Updated Vital Signs BP (!) 152/91 (BP Location: Left Arm)   Pulse 66   Temp 97.6 F (36.4 C) (Oral)   Resp 17   Ht 5' 1 (1.549 m)   Wt 46.9 kg   SpO2 92%   BMI 19.54 kg/m   Physical Exam Vitals and nursing note reviewed.  Constitutional:      Appearance: She is well-developed.  HENT:     Head: Normocephalic.     Nose: Nose normal.  Eyes:     General: No scleral icterus.    Conjunctiva/sclera: Conjunctivae normal.  Neck:     Thyroid : No thyromegaly.  Cardiovascular:     Rate and Rhythm: Normal rate and regular rhythm.     Heart sounds: No murmur heard.    No friction rub. No gallop.  Pulmonary:     Breath sounds: No stridor. No wheezing or rales.  Chest:     Chest wall: No tenderness.  Abdominal:     General: There is no distension.     Tenderness: There is no abdominal tenderness. There is no rebound.  Musculoskeletal:        General: Normal range of motion.     Cervical back: Neck supple.  Lymphadenopathy:     Cervical: No cervical adenopathy.  Skin:    Findings: No erythema or rash.  Neurological:     Mental Status: She is alert and oriented to person, place, and time.     Motor: No abnormal muscle tone.     Coordination: Coordination normal.  Psychiatric:        Behavior: Behavior normal.     (all labs ordered are listed, but only abnormal results are displayed) Labs Reviewed  CBC WITH DIFFERENTIAL/PLATELET - Abnormal; Notable for the following components:      Result Value   RBC 3.45 (*)    Hemoglobin 10.8 (*)    HCT 33.8 (*)    All other components within normal limits  COMPREHENSIVE METABOLIC PANEL WITH GFR - Abnormal; Notable for the following components:   Chloride 97 (*)    BUN 32 (*)    Creatinine, Ser 1.48 (*)    Calcium  13.0 (*)    GFR, Estimated 34 (*)    All other components within normal limits   URINALYSIS, ROUTINE W REFLEX MICROSCOPIC - Abnormal; Notable for the following components:   APPearance CLOUDY (*)    Leukocytes,Ua SMALL (*)    Bacteria, UA RARE (*)    All other components within normal limits  URINE CULTURE    EKG: None  Radiology: No results found.   Procedures   Medications Ordered in the ED - No data to display  Medical Decision Making Amount and/or Complexity of Data Reviewed Labs: ordered. Radiology: ordered.  Risk Prescription drug management.   Patient with possible urinary tract infection and mild dehydration.  She is started on Keflex  and will follow-up with PCP next week.  Patient has questionable infiltrates on chest x-ray and her primary care doctor can follow-up     Final diagnoses:  Acute cystitis with hematuria  Dehydration    ED Discharge Orders          Ordered    cephALEXin  (KEFLEX ) 500 MG capsule  4 times daily        10/01/24 1655               Suzette Pac, MD 10/03/24 1118

## 2024-10-02 ENCOUNTER — Telehealth: Payer: Self-pay

## 2024-10-02 DIAGNOSIS — I5032 Chronic diastolic (congestive) heart failure: Secondary | ICD-10-CM

## 2024-10-02 DIAGNOSIS — I10 Essential (primary) hypertension: Secondary | ICD-10-CM

## 2024-10-02 LAB — URINE CULTURE: Culture: <10000 — AB

## 2024-10-16 ENCOUNTER — Telehealth: Payer: Self-pay

## 2024-10-16 NOTE — Progress Notes (Signed)
 Complex Care Management Note  Care Guide Note 10/16/2024 Name: Rhonda Spears MRN: [id number] DOB: 03-13-1934  Rhonda Spears is a 89 y.o. year old female who sees Toribio Jerel MATSU, MD for primary care. I reached out to Rhonda Spears by phone today to offer complex care management services.  [name] was given information about Complex Care Management services today including:   The Complex Care Management services include support from the care team which includes your Nurse Care Manager, Clinical Social Worker, or Pharmacist.  The Complex Care Management team is here to help remove barriers to the health concerns and goals most important to you. Complex Care Management services are voluntary, and the patient may decline or stop services at any time by request to their care team member.   Complex Care Management Consent Status: Patient agreed to services and verbal consent obtained.   Follow up plan:  Telephone appointment with complex care management team member scheduled for:  10/24/24 @ 1PM  Encounter Outcome:  Patient Scheduled  Leotis Rase [facility], [facility] Guide  Direct Dial: [phone]  Fax [phone]

## 2024-10-24 ENCOUNTER — Telehealth: Payer: Self-pay

## 2024-10-24 ENCOUNTER — Telehealth: Payer: Self-pay | Admitting: *Deleted

## 2024-10-24 NOTE — Progress Notes (Signed)
 Complex Care Management Care Guide Note  10/24/2024 Name: Rhonda Spears MRN: [id number] DOB: [birth year]  Rhonda Spears is a 89 y.o. year old female who is a primary care patient of Toribio Jerel MATSU, MD and is actively engaged with the care management team. I reached out to Rhonda Spears by phone today to assist with re-scheduling  with the RN Case Manager.  Follow up plan: Telephone appointment with complex care management team member scheduled for:  10/27/24 at 2:00 p.m.   Dreama [facility]  [facility], [facility] VBCI Assistant Direct Dial: [phone]  Fax: [phone]

## 2024-10-27 ENCOUNTER — Encounter: Payer: Self-pay | Admitting: *Deleted

## 2024-10-27 ENCOUNTER — Other Ambulatory Visit: Payer: Self-pay | Admitting: *Deleted

## 2024-11-06 ENCOUNTER — Other Ambulatory Visit: Payer: Self-pay

## 2024-11-06 NOTE — Patient Instructions (Signed)
 Jamee LITTIE Macleod - I am sorry I was unable to reach you today for our scheduled appointment. I work with Toribio Jerel MATSU, MD and am calling to support your healthcare needs. Please contact me at [phone] at your earliest convenience. I look forward to speaking with you soon.   Thank you,  Orlean Fey, BSW [place]  [facility] Social Worker, [employer] [phone]

## 2024-11-10 ENCOUNTER — Telehealth: Payer: Self-pay

## 2024-11-10 NOTE — Progress Notes (Unsigned)
 Complex Care Management Care Guide Note  11/10/2024 Name: Rhonda Spears MRN: [id number] DOB: [birth year]  Rhonda Spears is a 89 y.o. year old female who is a primary care patient of Toribio Jerel MATSU, MD and is actively engaged with the care management team. I reached out to Rhonda Spears by phone today to assist with re-scheduling  with the BSW.  Follow up plan: Unsuccessful telephone outreach attempt made. A HIPAA compliant phone message was left for the patient providing contact information and requesting a return call.  Leotis Rase [facility], [facility] Guide  Direct Dial: [phone]  Fax [phone]

## 2024-11-11 ENCOUNTER — Telehealth: Admitting: *Deleted

## 2024-11-11 NOTE — Progress Notes (Signed)
 Complex Care Management Care Guide Note  11/11/2024 Name: Rhonda Spears MRN: [id number] DOB: Jul 14, 1934  Rhonda Spears is a 89 y.o. year old female who is a primary care patient of Toribio Jerel MATSU, MD and is actively engaged with the care management team. I reached out to Rhonda Spears by phone today to assist with re-scheduling  with the BSW.  Follow up plan: Unsuccessful telephone outreach attempt made. A HIPAA compliant phone message was left for the patient providing contact information and requesting a return call.  Leotis Rase [facility], [facility] Guide  Direct Dial: [phone]  Fax [phone]

## 2024-11-14 ENCOUNTER — Telehealth: Payer: Self-pay | Admitting: *Deleted

## 2024-11-17 ENCOUNTER — Telehealth: Payer: Self-pay | Admitting: *Deleted

## 2024-11-20 ENCOUNTER — Telehealth: Payer: Self-pay | Admitting: *Deleted

## 2024-11-26 ENCOUNTER — Telehealth: Payer: Self-pay | Admitting: *Deleted

## 2025-01-09 ENCOUNTER — Ambulatory Visit: Admitting: Student in an Organized Health Care Education/Training Program
